# Patient Record
Sex: Female | Born: 1950 | Race: White | Hispanic: No | Smoking: Former smoker
Health system: Southern US, Community
[De-identification: ages and names within clinical notes are randomized; demographics above are authoritative.]

## PROBLEM LIST (undated history)

## (undated) DIAGNOSIS — M1990 Unspecified osteoarthritis, unspecified site: Secondary | ICD-10-CM

## (undated) DIAGNOSIS — J95811 Postprocedural pneumothorax: Secondary | ICD-10-CM

## (undated) DIAGNOSIS — E079 Disorder of thyroid, unspecified: Secondary | ICD-10-CM

## (undated) DIAGNOSIS — J449 Chronic obstructive pulmonary disease, unspecified: Secondary | ICD-10-CM

## (undated) DIAGNOSIS — I499 Cardiac arrhythmia, unspecified: Secondary | ICD-10-CM

## (undated) DIAGNOSIS — I493 Ventricular premature depolarization: Secondary | ICD-10-CM

## (undated) DIAGNOSIS — Z8489 Family history of other specified conditions: Secondary | ICD-10-CM

## (undated) DIAGNOSIS — M069 Rheumatoid arthritis, unspecified: Secondary | ICD-10-CM

## (undated) DIAGNOSIS — T4145XA Adverse effect of unspecified anesthetic, initial encounter: Secondary | ICD-10-CM

## (undated) DIAGNOSIS — E7800 Pure hypercholesterolemia, unspecified: Secondary | ICD-10-CM

## (undated) DIAGNOSIS — R112 Nausea with vomiting, unspecified: Secondary | ICD-10-CM

## (undated) DIAGNOSIS — Z9889 Other specified postprocedural states: Secondary | ICD-10-CM

## (undated) DIAGNOSIS — T8859XA Other complications of anesthesia, initial encounter: Secondary | ICD-10-CM

## (undated) DIAGNOSIS — M797 Fibromyalgia: Secondary | ICD-10-CM

## (undated) DIAGNOSIS — H269 Unspecified cataract: Secondary | ICD-10-CM

## (undated) DIAGNOSIS — E039 Hypothyroidism, unspecified: Secondary | ICD-10-CM

## (undated) DIAGNOSIS — C801 Malignant (primary) neoplasm, unspecified: Secondary | ICD-10-CM

## (undated) DIAGNOSIS — H35039 Hypertensive retinopathy, unspecified eye: Secondary | ICD-10-CM

## (undated) HISTORY — PX: NASAL SINUS SURGERY: SHX719

## (undated) HISTORY — PX: OTHER SURGICAL HISTORY: SHX169

## (undated) HISTORY — PX: TUBAL LIGATION: SHX77

## (undated) HISTORY — PX: BREAST SURGERY: SHX581

## (undated) HISTORY — PX: EYE SURGERY: SHX253

## (undated) HISTORY — DX: Hypertensive retinopathy, unspecified eye: H35.039

## (undated) HISTORY — DX: Unspecified cataract: H26.9

---

## 1998-01-15 ENCOUNTER — Other Ambulatory Visit: Admission: RE | Admit: 1998-01-15 | Discharge: 1998-01-15 | Payer: Self-pay | Admitting: Gynecology

## 1998-03-16 ENCOUNTER — Ambulatory Visit (HOSPITAL_COMMUNITY): Admission: RE | Admit: 1998-03-16 | Discharge: 1998-03-16 | Payer: Self-pay | Admitting: Gynecology

## 1998-06-20 HISTORY — PX: ABDOMINAL HYSTERECTOMY: SHX81

## 1999-01-21 ENCOUNTER — Other Ambulatory Visit: Admission: RE | Admit: 1999-01-21 | Discharge: 1999-01-21 | Payer: Self-pay | Admitting: Gynecology

## 2000-03-10 ENCOUNTER — Other Ambulatory Visit: Admission: RE | Admit: 2000-03-10 | Discharge: 2000-03-10 | Payer: Self-pay | Admitting: Gynecology

## 2000-03-14 ENCOUNTER — Encounter: Admission: RE | Admit: 2000-03-14 | Discharge: 2000-03-14 | Payer: Self-pay | Admitting: Gynecology

## 2002-04-30 ENCOUNTER — Other Ambulatory Visit: Admission: RE | Admit: 2002-04-30 | Discharge: 2002-04-30 | Payer: Self-pay | Admitting: Gynecology

## 2002-05-15 ENCOUNTER — Ambulatory Visit (HOSPITAL_COMMUNITY): Admission: RE | Admit: 2002-05-15 | Discharge: 2002-05-15 | Payer: Self-pay | Admitting: Gynecology

## 2002-05-20 ENCOUNTER — Inpatient Hospital Stay (HOSPITAL_COMMUNITY): Admission: RE | Admit: 2002-05-20 | Discharge: 2002-05-21 | Payer: Self-pay | Admitting: Gynecology

## 2002-06-21 ENCOUNTER — Ambulatory Visit (HOSPITAL_COMMUNITY): Admission: RE | Admit: 2002-06-21 | Discharge: 2002-06-21 | Payer: Self-pay | Admitting: Gynecology

## 2002-07-05 ENCOUNTER — Inpatient Hospital Stay (HOSPITAL_COMMUNITY): Admission: RE | Admit: 2002-07-05 | Discharge: 2002-07-07 | Payer: Self-pay | Admitting: Gynecology

## 2002-12-04 ENCOUNTER — Encounter: Payer: Self-pay | Admitting: Internal Medicine

## 2002-12-04 ENCOUNTER — Ambulatory Visit (HOSPITAL_COMMUNITY): Admission: RE | Admit: 2002-12-04 | Discharge: 2002-12-04 | Payer: Self-pay | Admitting: Internal Medicine

## 2003-02-21 ENCOUNTER — Encounter: Payer: Self-pay | Admitting: Otolaryngology

## 2003-02-21 ENCOUNTER — Ambulatory Visit (HOSPITAL_COMMUNITY): Admission: RE | Admit: 2003-02-21 | Discharge: 2003-02-21 | Payer: Self-pay | Admitting: Otolaryngology

## 2003-08-06 ENCOUNTER — Ambulatory Visit (HOSPITAL_COMMUNITY): Admission: RE | Admit: 2003-08-06 | Discharge: 2003-08-06 | Payer: Self-pay | Admitting: Family Medicine

## 2004-02-20 ENCOUNTER — Ambulatory Visit (HOSPITAL_COMMUNITY): Admission: RE | Admit: 2004-02-20 | Discharge: 2004-02-20 | Payer: Self-pay | Admitting: Internal Medicine

## 2004-02-25 ENCOUNTER — Ambulatory Visit (HOSPITAL_COMMUNITY): Admission: RE | Admit: 2004-02-25 | Discharge: 2004-02-25 | Payer: Self-pay | Admitting: Internal Medicine

## 2004-11-11 ENCOUNTER — Ambulatory Visit (HOSPITAL_COMMUNITY): Admission: RE | Admit: 2004-11-11 | Discharge: 2004-11-11 | Payer: Self-pay | Admitting: Family Medicine

## 2004-12-13 ENCOUNTER — Ambulatory Visit: Payer: Self-pay | Admitting: Internal Medicine

## 2005-05-27 ENCOUNTER — Ambulatory Visit (HOSPITAL_COMMUNITY): Admission: RE | Admit: 2005-05-27 | Discharge: 2005-05-27 | Payer: Self-pay | Admitting: Family Medicine

## 2005-09-02 HISTORY — PX: CARDIOVASCULAR STRESS TEST: SHX262

## 2005-10-20 ENCOUNTER — Ambulatory Visit (HOSPITAL_COMMUNITY): Admission: RE | Admit: 2005-10-20 | Discharge: 2005-10-20 | Payer: Self-pay | Admitting: Family Medicine

## 2006-02-12 ENCOUNTER — Encounter: Admission: RE | Admit: 2006-02-12 | Discharge: 2006-02-12 | Payer: Self-pay | Admitting: Rheumatology

## 2006-10-23 ENCOUNTER — Ambulatory Visit (HOSPITAL_COMMUNITY): Admission: RE | Admit: 2006-10-23 | Discharge: 2006-10-23 | Payer: Self-pay | Admitting: Family Medicine

## 2007-06-11 ENCOUNTER — Ambulatory Visit: Payer: Self-pay | Admitting: Internal Medicine

## 2007-06-28 ENCOUNTER — Telehealth: Payer: Self-pay | Admitting: Internal Medicine

## 2007-09-24 ENCOUNTER — Ambulatory Visit: Payer: Self-pay | Admitting: Internal Medicine

## 2007-09-24 DIAGNOSIS — J449 Chronic obstructive pulmonary disease, unspecified: Secondary | ICD-10-CM | POA: Insufficient documentation

## 2007-09-24 DIAGNOSIS — J4489 Other specified chronic obstructive pulmonary disease: Secondary | ICD-10-CM | POA: Insufficient documentation

## 2007-09-24 DIAGNOSIS — J984 Other disorders of lung: Secondary | ICD-10-CM | POA: Insufficient documentation

## 2007-10-30 ENCOUNTER — Ambulatory Visit (HOSPITAL_COMMUNITY): Admission: RE | Admit: 2007-10-30 | Discharge: 2007-10-30 | Payer: Self-pay | Admitting: Family Medicine

## 2008-11-19 ENCOUNTER — Ambulatory Visit (HOSPITAL_COMMUNITY): Admission: RE | Admit: 2008-11-19 | Discharge: 2008-11-19 | Payer: Self-pay | Admitting: Family Medicine

## 2009-12-08 ENCOUNTER — Ambulatory Visit (HOSPITAL_COMMUNITY): Admission: RE | Admit: 2009-12-08 | Discharge: 2009-12-08 | Payer: Self-pay | Admitting: Family Medicine

## 2010-07-11 ENCOUNTER — Encounter: Payer: Self-pay | Admitting: Family Medicine

## 2010-07-11 ENCOUNTER — Encounter: Payer: Self-pay | Admitting: Internal Medicine

## 2010-12-27 ENCOUNTER — Other Ambulatory Visit (HOSPITAL_COMMUNITY): Payer: Self-pay | Admitting: Family Medicine

## 2010-12-27 DIAGNOSIS — Z139 Encounter for screening, unspecified: Secondary | ICD-10-CM

## 2010-12-28 ENCOUNTER — Ambulatory Visit (HOSPITAL_COMMUNITY)
Admission: RE | Admit: 2010-12-28 | Discharge: 2010-12-28 | Disposition: A | Discharge status: Home / Self Care | Payer: BC Managed Care – PPO | Source: Ambulatory Visit | Attending: Family Medicine | Admitting: Family Medicine

## 2010-12-28 ENCOUNTER — Ambulatory Visit (HOSPITAL_COMMUNITY): Admission: RE | Admit: 2010-12-28 | Payer: BC Managed Care – PPO | Source: Ambulatory Visit

## 2010-12-28 DIAGNOSIS — Z1231 Encounter for screening mammogram for malignant neoplasm of breast: Secondary | ICD-10-CM | POA: Insufficient documentation

## 2010-12-28 DIAGNOSIS — Z139 Encounter for screening, unspecified: Secondary | ICD-10-CM

## 2010-12-31 ENCOUNTER — Other Ambulatory Visit: Payer: Self-pay | Admitting: Family Medicine

## 2010-12-31 DIAGNOSIS — R928 Other abnormal and inconclusive findings on diagnostic imaging of breast: Secondary | ICD-10-CM

## 2011-01-03 ENCOUNTER — Other Ambulatory Visit: Payer: Self-pay | Admitting: Family Medicine

## 2011-01-03 DIAGNOSIS — R928 Other abnormal and inconclusive findings on diagnostic imaging of breast: Secondary | ICD-10-CM

## 2011-01-05 ENCOUNTER — Ambulatory Visit
Admission: RE | Admit: 2011-01-05 | Discharge: 2011-01-05 | Disposition: A | Discharge status: Home / Self Care | Payer: BC Managed Care – PPO | Source: Ambulatory Visit | Attending: Family Medicine | Admitting: Family Medicine

## 2011-01-05 DIAGNOSIS — R928 Other abnormal and inconclusive findings on diagnostic imaging of breast: Secondary | ICD-10-CM

## 2011-06-28 NOTE — Progress Notes (Unsigned)
Patient ID: EVVA DIN, female   DOB: 10/22/1950, 61 y.o.   MRN: [id number]

## 2011-07-06 ENCOUNTER — Other Ambulatory Visit (HOSPITAL_COMMUNITY): Payer: Self-pay | Admitting: Internal Medicine

## 2011-07-06 DIAGNOSIS — R51 Headache: Secondary | ICD-10-CM

## 2011-07-06 DIAGNOSIS — R42 Dizziness and giddiness: Secondary | ICD-10-CM

## 2011-07-06 DIAGNOSIS — H539 Unspecified visual disturbance: Secondary | ICD-10-CM

## 2011-07-06 DIAGNOSIS — I69998 Other sequelae following unspecified cerebrovascular disease: Secondary | ICD-10-CM

## 2011-07-08 ENCOUNTER — Ambulatory Visit (HOSPITAL_COMMUNITY)
Admission: RE | Admit: 2011-07-08 | Discharge: 2011-07-08 | Disposition: A | Discharge status: Home / Self Care | Payer: BC Managed Care – PPO | Source: Ambulatory Visit | Attending: Internal Medicine | Admitting: Internal Medicine

## 2011-07-08 DIAGNOSIS — H539 Unspecified visual disturbance: Secondary | ICD-10-CM

## 2011-07-08 DIAGNOSIS — I69998 Other sequelae following unspecified cerebrovascular disease: Secondary | ICD-10-CM

## 2011-07-08 DIAGNOSIS — R51 Headache: Secondary | ICD-10-CM | POA: Insufficient documentation

## 2011-07-08 DIAGNOSIS — R269 Unspecified abnormalities of gait and mobility: Secondary | ICD-10-CM | POA: Insufficient documentation

## 2011-07-08 DIAGNOSIS — R42 Dizziness and giddiness: Secondary | ICD-10-CM

## 2011-10-12 ENCOUNTER — Encounter (HOSPITAL_COMMUNITY): Payer: Self-pay | Admitting: Anesthesiology

## 2011-10-12 ENCOUNTER — Inpatient Hospital Stay: Admit: 2011-10-12 | Payer: Self-pay | Admitting: Neurosurgery

## 2011-10-12 SURGERY — LUMBAR LAMINECTOMY FOR TUMOR
Anesthesia: General

## 2011-12-02 ENCOUNTER — Other Ambulatory Visit (HOSPITAL_COMMUNITY): Payer: Self-pay | Admitting: Family Medicine

## 2011-12-02 DIAGNOSIS — Z139 Encounter for screening, unspecified: Secondary | ICD-10-CM

## 2011-12-26 HISTORY — PX: US ECHOCARDIOGRAPHY: HXRAD669

## 2012-01-10 ENCOUNTER — Ambulatory Visit (HOSPITAL_COMMUNITY)
Admission: RE | Admit: 2012-01-10 | Discharge: 2012-01-10 | Disposition: A | Discharge status: Home / Self Care | Payer: BC Managed Care – PPO | Source: Ambulatory Visit | Attending: Family Medicine | Admitting: Family Medicine

## 2012-01-10 DIAGNOSIS — Z1231 Encounter for screening mammogram for malignant neoplasm of breast: Secondary | ICD-10-CM | POA: Insufficient documentation

## 2012-01-10 DIAGNOSIS — Z139 Encounter for screening, unspecified: Secondary | ICD-10-CM

## 2012-02-19 ENCOUNTER — Emergency Department (HOSPITAL_COMMUNITY)
Admission: EM | Admit: 2012-02-19 | Discharge: 2012-02-19 | Disposition: A | Discharge status: Home / Self Care | Payer: BC Managed Care – PPO | Attending: Emergency Medicine | Admitting: Emergency Medicine

## 2012-02-19 ENCOUNTER — Encounter (HOSPITAL_COMMUNITY): Payer: Self-pay | Admitting: Emergency Medicine

## 2012-02-19 ENCOUNTER — Emergency Department (HOSPITAL_COMMUNITY): Payer: BC Managed Care – PPO

## 2012-02-19 DIAGNOSIS — IMO0001 Reserved for inherently not codable concepts without codable children: Secondary | ICD-10-CM | POA: Insufficient documentation

## 2012-02-19 DIAGNOSIS — M1990 Unspecified osteoarthritis, unspecified site: Secondary | ICD-10-CM | POA: Insufficient documentation

## 2012-02-19 DIAGNOSIS — E079 Disorder of thyroid, unspecified: Secondary | ICD-10-CM | POA: Insufficient documentation

## 2012-02-19 DIAGNOSIS — E7800 Pure hypercholesterolemia, unspecified: Secondary | ICD-10-CM | POA: Insufficient documentation

## 2012-02-19 DIAGNOSIS — M79609 Pain in unspecified limb: Secondary | ICD-10-CM | POA: Insufficient documentation

## 2012-02-19 DIAGNOSIS — Z87891 Personal history of nicotine dependence: Secondary | ICD-10-CM | POA: Insufficient documentation

## 2012-02-19 DIAGNOSIS — M79672 Pain in left foot: Secondary | ICD-10-CM

## 2012-02-19 HISTORY — DX: Disorder of thyroid, unspecified: E07.9

## 2012-02-19 HISTORY — DX: Unspecified osteoarthritis, unspecified site: M19.90

## 2012-02-19 HISTORY — DX: Postprocedural pneumothorax: J95.811

## 2012-02-19 HISTORY — DX: Cardiac arrhythmia, unspecified: I49.9

## 2012-02-19 HISTORY — DX: Pure hypercholesterolemia, unspecified: E78.00

## 2012-02-19 HISTORY — DX: Fibromyalgia: M79.7

## 2012-02-19 MED ORDER — IBUPROFEN 800 MG PO TABS
800.0000 mg | ORAL_TABLET | Freq: Once | ORAL | Status: AC
  Administered 2012-02-19: 800 mg via ORAL
  Filled 2012-02-19: qty 1

## 2012-02-19 MED ORDER — ONDANSETRON 4 MG PO TBDP
4.0000 mg | ORAL_TABLET | Freq: Once | ORAL | Status: AC
  Administered 2012-02-19: 4 mg via ORAL
  Filled 2012-02-19: qty 1

## 2012-02-19 MED ORDER — HYDROCODONE-ACETAMINOPHEN 5-325 MG PO TABS
1.0000 | ORAL_TABLET | Freq: Once | ORAL | Status: AC
  Administered 2012-02-19: 1 via ORAL
  Filled 2012-02-19: qty 1

## 2012-02-19 MED ORDER — HYDROCODONE-ACETAMINOPHEN 5-325 MG PO TABS: ORAL_TABLET | ORAL | Status: DC

## 2012-02-19 MED ORDER — HYDROCOD POLST-CHLORPHEN POLST 10-8 MG/5ML PO LQCR: 5.0000 mL | Freq: Once | ORAL | Status: DC

## 2012-02-19 MED ORDER — ONDANSETRON 4 MG PO TBDP: 4.0000 mg | ORAL_TABLET | Freq: Three times a day (TID) | ORAL | Status: AC | PRN

## 2012-02-19 NOTE — ED Provider Notes (Signed)
Medical screening examination/treatment/procedure(s) were performed by non-physician practitioner and as supervising physician I was immediately available for consultation/collaboration.   Christyne Mccain L Chakia Counts, MD 02/19/12 2239 

## 2012-02-19 NOTE — ED Notes (Signed)
Pt c/o left foot pain since 1630. Pt states pain radiates up left leg

## 2012-02-19 NOTE — ED Provider Notes (Signed)
History     CSN: [id number]  Arrival date & time 02/19/12  4132   First MD Initiated Contact with Patient 02/19/12 1940      Chief Complaint  Patient presents with  . Foot Pain    (Consider location/radiation/quality/duration/timing/severity/associated sxs/prior treatment) HPI Comments: Standing in her kitchen washing dishes and her L foot began hurting.  Denies trauma.  No h/o gout.  + h/o of osteoporosis.  Patient is a 61 y.o. female presenting with lower extremity pain. The history is provided by the patient. No language interpreter was used.  Foot Pain This is a new problem. The current episode started today. The problem occurs constantly. The problem has been unchanged. Pertinent negatives include no chills, fever, numbness or weakness. The symptoms are aggravated by walking and standing. She has tried nothing for the symptoms.    Past Medical History  Diagnosis Date  . Thyroid disease   . Irregular heart beat   . High cholesterol   . Pneumothorax after biopsy   . Osteoarthritis   . Fibromyalgia     Past Surgical History  Procedure Date  . Abdominal hysterectomy   . Breast surgery   . Tubal ligation   . Nasal sinus surgery     No family history on file.  History  Substance Use Topics  . Smoking status: Former Games developer  . Smokeless tobacco: Not on file  . Alcohol Use: No    OB History    Grav Para Term Preterm Abortions TAB SAB Ect Mult Living                  Review of Systems  Constitutional: Negative for fever and chills.  Musculoskeletal:       Foot pain   Neurological: Negative for weakness and numbness.  All other systems reviewed and are negative.    Allergies  Codeine  Home Medications   Current Outpatient Rx  Name Route Sig Dispense Refill  . LEVOTHYROXINE SODIUM 75 MCG PO TABS Oral Take 75 mcg by mouth daily.    Marland Kitchen METOPROLOL SUCCINATE ER 50 MG PO TB24 Oral Take 75 mg by mouth Daily.    Marland Kitchen HYDROCODONE-ACETAMINOPHEN 5-325 MG PO TABS   One tab po q 4-6 hrs prn pain 20 tablet 0    BP 138/84  Pulse 74  Temp 98 F (36.7 C) (Oral)  Resp 20  Ht 5\' 5"  (1.651 m)  Wt 155 lb (70.308 kg)  BMI 25.79 kg/m2  SpO2 100%  Physical Exam  Nursing note and vitals reviewed. Constitutional: She is oriented to person, place, and time. She appears well-developed and well-nourished. No distress.  HENT:  Head: Normocephalic and atraumatic.  Eyes: EOM are normal.  Neck: Normal range of motion.  Cardiovascular: Normal rate, regular rhythm and normal heart sounds.   Pulmonary/Chest: Effort normal and breath sounds normal.  Abdominal: Soft. She exhibits no distension. There is no tenderness.  Musculoskeletal: Normal range of motion.       Feet:  Neurological: She is alert and oriented to person, place, and time.  Skin: Skin is warm and dry.  Psychiatric: She has a normal mood and affect. Judgment normal.    ED Course  Procedures (including critical care time)  Labs Reviewed - No data to display Dg Foot Complete Left  02/19/2012  *RADIOLOGY REPORT*  Clinical Data: Generalized left foot pain.  No known injuries.  LEFT FOOT - COMPLETE 3+ VIEW  Comparison: None.  Findings: No evidence of acute or subacute  fracture or dislocation. Well-preserved joint spaces.  Well-preserved bone mineral density. No intrinsic osseous abnormalities.  IMPRESSION: Normal examination.   Original Report Authenticated By: Arnell Sieving, M.D.      1. Left foot pain       MDM  Ice, ibuprofen rx-hydrocodone, 20 F/u with dr. Hilda Lias prn         Evalina Field, PA 02/19/12 2140

## 2013-04-23 ENCOUNTER — Ambulatory Visit (INDEPENDENT_AMBULATORY_CARE_PROVIDER_SITE_OTHER): Payer: BC Managed Care – PPO | Admitting: Cardiovascular Disease

## 2013-04-23 ENCOUNTER — Encounter: Payer: Self-pay | Admitting: Cardiovascular Disease

## 2013-04-23 VITALS — BP 110/80 | HR 51 | Ht 66.0 in | Wt 143.0 lb

## 2013-04-23 DIAGNOSIS — E785 Hyperlipidemia, unspecified: Secondary | ICD-10-CM

## 2013-04-23 DIAGNOSIS — E039 Hypothyroidism, unspecified: Secondary | ICD-10-CM

## 2013-04-23 DIAGNOSIS — R002 Palpitations: Secondary | ICD-10-CM | POA: Insufficient documentation

## 2013-04-23 NOTE — Progress Notes (Signed)
Patient ID: Dominique Lee, female   DOB: 1951/02/17, 62 y.o.   MRN: [id number]     HPI: [name] is a 62 y.o. female who presents for one-year cardiology evaluation.  Dominique Lee has a history of palpitations with documented PVCs which have been controlled with increased beta blocker therapy to 75 mg. She also has a history of hypothyroidism on Synthroid replacement. She has a history of significant hyperlipidemia but apparently has been intolerant to numerous statins as well as WelChol in addition to Zetia. She also has seasonal allergies.  Over the past year, she has continued to be fairly stable. She denies any significant palpitations. 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 Her last echo Doppler study showed an EF of 5055% with normal chamber dimensions and muscle thickness. She denies chest pain. He denies presyncope. She denies edema.  Past Medical History  Diagnosis Date  . Thyroid disease   . Irregular heart beat   . High cholesterol   . Pneumothorax after biopsy   . Osteoarthritis   . Fibromyalgia     Past Surgical History  Procedure Laterality Date  . Abdominal hysterectomy  2000  . Breast surgery    . Tubal ligation    . Nasal sinus surgery      Allergies  Allergen Reactions  . Codeine Other (See Comments)    Makes violent    Current Outpatient Prescriptions  Medication Sig Dispense Refill  . levothyroxine (SYNTHROID, LEVOTHROID) 75 MCG tablet Take 75 mcg by mouth daily.      . metoprolol succinate (TOPROL-XL) 50 MG 24 hr tablet Take 75 mg by mouth Daily.       No current facility-administered medications for this visit.    History   Social History  . Marital Status: Divorced    Spouse Name: N/A    Number of Children: N/A  . Years of Education: N/A   Occupational History  . Not on file.   Social History Main Topics  . Smoking status: Former Smoker    Types: Cigarettes    Quit date: 11/04/2002  .  Smokeless tobacco: Not on file  . Alcohol Use: No  . Drug Use: No  . Sexual Activity: Not on file   Other Topics Concern  . Not on file   Social History Narrative  . No narrative on file   Social history is notable that she is divorced with 3 children. She works as a Surveyor, mining. No tobacco or alcohol use.  Family History  Problem Relation Age of Onset  . Hypertension Mother   . Cancer - Lung Father 107    deceased  . Heart attack Maternal Grandmother   . Cancer Maternal Grandfather   . Diabetes Paternal Grandfather   . Diabetes Brother   . Cancer - Colon Sister     ROS is negative for fevers, chills or night sweats. She denies visual symptoms. She denies PND or orthopnea. He denies wheezing chest congestion or sputum production. She denies adenopathy. She denies any significant tachycardia palpitations. She denies chest pressure. She denies nausea vomiting diarrhea. In the past and significant myalgias secondary to statins. Apparently cannot tolerate WelChol or Zetia. She denies skin rash. There are no tremors. She denies weight loss or heat or cold intolerance. There is no edema.   Other comprehensive 12 point system review is negative.  PE BP 110/80  Pulse 51  Ht 5\' 6"  (1.676 m)  Wt 143  lb (64.864 kg)  BMI 23.09 kg/m2  General: Alert, oriented, no distress.  Skin: normal turgor, no rashes HEENT: Normocephalic, atraumatic. Pupils round and reactive; sclera anicteric;no lid lag.  Nose without nasal septal hypertrophy Mouth/Parynx benign; Mallinpatti scale 2 Neck: No JVD, no carotid briuts Lungs: clear to ausculatation and percussion; no wheezing or rales Heart: RRR, s1 s2 normal 1/6 systolic murmur. Abdomen: soft, nontender; no hepatosplenomehaly, BS+; abdominal aorta nontender and not dilated by palpation. Pulses 2+ Extremities: no clubbing cyanosis or edema, Homan's sign negative  Neurologic: grossly nonfocal Psychologic: normal affect and mood.  ECG: Sinus  bradycardia 51 beats per minute without ectopy. Normal intervals.  LABS:  BMET No results found for this basename: na, k, cl, co2, glucose, bun, creatinine, calcium, gfrnonaa, gfraa     Hepatic Function Panel  No results found for this basename: prot, albumin, ast, alt, alkphos, bilitot, bilidir, ibili     CBC No results found for this basename: wbc, rbc, hgb, hct, plt, mcv, mch, mchc, rdw, neutrabs, lymphsabs, monoabs, eosabs, basosabs     BNP No results found for this basename: probnp    Lipid Panel  No results found for this basename: chol, trig, hdl, cholhdl, vldl, ldlcalc     RADIOLOGY: No results found.    ASSESSMENT AND PLAN:  Dominique Lee continues to feel well. She's not having any palpitations has been currently taking her Toprol 75 mg dose at bedtime. She is in need for thyroid function as well as laboratory. She's not had any lipid studies checked in over 2 years. When Dr. Fara Boros does check her laboratory it may be possible if her cholesterol is still even further increased to consider perhaps a trial of little low which she has not tried in the past. Presently, we'll try to slightly reduce her beta blocker therapy so that she will take 50 mg on even days and 75 mg one odd days. If after one month of this approach if she is not noticing any difference between the 2 days then she can try to reduce her dose to 50 mg daily. I will see her in one year for followup evaluation or sooner if problems arise.    Lennette Bihari, MD, [facility]  04/23/2013 4:26 PM

## 2013-04-23 NOTE — Patient Instructions (Signed)
Your physician has recommended you make the following change in your medication: change  Metoprolol to 75 mg on odd days and 50 mg on even days.  Your physician recommends that you schedule a follow-up appointment in: 1 YEAR.

## 2013-09-04 ENCOUNTER — Other Ambulatory Visit: Payer: Self-pay | Admitting: Cardiovascular Disease

## 2013-09-04 NOTE — Telephone Encounter (Signed)
Rx was sent to pharmacy electronically. 

## 2013-10-07 ENCOUNTER — Other Ambulatory Visit (HOSPITAL_COMMUNITY): Payer: Self-pay | Admitting: Family Medicine

## 2013-10-07 DIAGNOSIS — Z139 Encounter for screening, unspecified: Secondary | ICD-10-CM

## 2013-10-10 ENCOUNTER — Ambulatory Visit (HOSPITAL_COMMUNITY)
Admission: RE | Admit: 2013-10-10 | Discharge: 2013-10-10 | Disposition: A | Discharge status: Home / Self Care | Payer: BC Managed Care – PPO | Source: Ambulatory Visit | Attending: Family Medicine | Admitting: Family Medicine

## 2013-10-10 DIAGNOSIS — Z139 Encounter for screening, unspecified: Secondary | ICD-10-CM

## 2013-11-06 ENCOUNTER — Telehealth: Payer: Self-pay | Admitting: Cardiovascular Disease

## 2013-11-06 NOTE — Telephone Encounter (Signed)
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  Please call

## 2013-11-06 NOTE — Telephone Encounter (Signed)
Pt. Asking what is the status of her form she needed filled out to drive

## 2013-11-07 ENCOUNTER — Telehealth: Payer: Self-pay | Admitting: *Deleted

## 2013-11-07 NOTE — Telephone Encounter (Signed)
Left message driving form has been completed and left at our front desk for her to pick up.

## 2014-05-07 ENCOUNTER — Encounter: Payer: Self-pay | Admitting: *Deleted

## 2014-05-12 ENCOUNTER — Encounter: Payer: Self-pay | Admitting: Cardiovascular Disease

## 2014-05-12 ENCOUNTER — Ambulatory Visit (INDEPENDENT_AMBULATORY_CARE_PROVIDER_SITE_OTHER): Payer: BC Managed Care – PPO | Admitting: Cardiovascular Disease

## 2014-05-12 VITALS — BP 126/70 | HR 52 | Ht 65.5 in | Wt 155.4 lb

## 2014-05-12 DIAGNOSIS — E785 Hyperlipidemia, unspecified: Secondary | ICD-10-CM

## 2014-05-12 DIAGNOSIS — E039 Hypothyroidism, unspecified: Secondary | ICD-10-CM

## 2014-05-12 DIAGNOSIS — R002 Palpitations: Secondary | ICD-10-CM

## 2014-05-12 NOTE — Patient Instructions (Signed)
Your physician wants you to follow-up in: 1 year or sooner if needed with Dr. Kelly. You will receive a reminder letter in the mail two months in advance. If you don't receive a letter, please call our office to schedule the follow-up appointment. 

## 2014-05-12 NOTE — Progress Notes (Signed)
Patient ID: Dominique Lee, female   DOB: 07-03-1950, 63 y.o.   MRN: [id number]     HPI: Dominique Lee is a 63 y.o. female who presents for one-year cardiology evaluation.  She tells me that she will be moving to the [facility] [place] area.  Next month, but she would still like to see me on at least a yearly visit basis.  Dominique Lee has a history of palpitations with documented PVCs which have been controlled with increased beta blocker therapy to 75 mg. She also has a history of hypothyroidism on Synthroid replacement. She has a history of significant hyperlipidemia but apparently has been intolerant to numerous statins as well as WelChol in addition to Zetia. She also has seasonal allergies.  Over the past year, she has continued to be fairly stable. She denies any significant palpitations.  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  She denies recent palpitations.  She denies chest pain.  She tells me Dr. Herby Abraham had checked laboratory over the summer months.  She presents for one-year evaluation.  Past Medical History  Diagnosis Date  . Thyroid disease   . Irregular heart beat   . High cholesterol   . Pneumothorax after biopsy   . Osteoarthritis   . Fibromyalgia     Past Surgical History  Procedure Laterality Date  . Abdominal hysterectomy  2000  . Breast surgery    . Tubal ligation    . Nasal sinus surgery    . US echocardiography  12/26/2011    mild abnormalities  . Cardiovascular stress test  09/02/2005    Cardiolite Myocardial perfusion study; NegativeBruce protocol exercise stress test    Allergies  Allergen Reactions  . Codeine Other (See Comments)    Makes violent  . Zocor [Simvastatin] Other (See Comments)    Current Outpatient Prescriptions  Medication Sig Dispense Refill  . levothyroxine (SYNTHROID, LEVOTHROID) 75 MCG tablet Take 75 mcg by mouth daily.    . metoprolol succinate  (TOPROL-XL) 50 MG 24 hr tablet Take 1-1.5 tablet (50 or 75mg  total) by mouth daily as directed. 45 tablet 8  . naproxen sodium (ANAPROX) 220 MG tablet Take 220 mg by mouth daily.     No current facility-administered medications for this visit.    History   Social History  . Marital Status: Divorced    Spouse Name: N/A    Number of Children: N/A  . Years of Education: N/A   Occupational History  . Not on file.   Social History Main Topics  . Smoking status: Former Smoker    Types: Cigarettes    Quit date: 11/04/2002  . Smokeless tobacco: Not on file  . Alcohol Use: No  . Drug Use: No  . Sexual Activity: Not on file   Other Topics Concern  . Not on file   Social History Narrative   Social history is notable that she is divorced with 3 children. She works as a Teacher, early years/pre. No tobacco or alcohol use.  Family History  Problem Relation Age of Onset  . Hypertension Mother   . Cancer - Lung Father 81    deceased  . Heart attack Maternal Grandmother   . Cancer Maternal Grandfather   . Diabetes Paternal Grandfather   . Diabetes Brother   . Cancer - Colon Sister    ROS General: Negative; No fevers, chills, or night sweats;  HEENT: Negative; No changes in vision or hearing, sinus congestion, difficulty swallowing Pulmonary: Negative; No cough, wheezing, shortness of breath, hemoptysis Cardiovascular: Negative; No chest pain, presyncope, syncope, palpitations GI: Negative; No nausea, vomiting, diarrhea, or abdominal pain GU: Negative; No dysuria, hematuria, or difficulty voiding Musculoskeletal: Mild osteoarthritis; no myalgias, or weakness Hematologic/Oncology: Negative; no easy bruising, bleeding Endocrine: Negative; no heat/cold intolerance; no diabetes Neuro: Negative; no changes in balance, headaches Skin: Negative; No rashes or skin lesions Psychiatric: Negative; No behavioral problems, depression Sleep: Negative; No snoring, daytime sleepiness,  hypersomnolence, bruxism, restless legs, hypnogognic hallucinations, no cataplexy Other comprehensive 14 point system review is negative.   PE BP 126/70 mmHg  Pulse 52  Ht 5' 5.5" (1.664 m)  Wt 155 lb 6.4 oz (70.489 kg)  BMI 25.46 kg/m2  General: Alert, oriented, no distress.  Skin: normal turgor, no rashes HEENT: Normocephalic, atraumatic. Pupils round and reactive; sclera anicteric;no lid lag.  Nose without nasal septal hypertrophy Mouth/Parynx benign; Mallinpatti scale 2 Neck: No JVD, no carotid bruits with normal carotid upstroke Lungs: clear to ausculatation and percussion; no wheezing or rales Chest wall: Nontender to palpation Heart: RRR, s1 s2 normal 1/6 systolic murmur; no diastolic murmur, rubs, thrills or heaves Abdomen: soft, nontender; no hepatosplenomehaly, BS+; abdominal aorta nontender and not dilated by palpation. Back: No CVA tenderness Pulses 2+ Extremities: no clubbing cyanosis or edema, Homan's sign negative  Neurologic: grossly nonfocal; radial nerves.  Normal Psychologic: normal affect and mood.  ECG (independently read by me): Sinus bradycardia 52 bpm.  No ectopy.  Normal intervals.  Prior November 2014 ECG: Sinus bradycardia 51 beats per minute without ectopy. Normal intervals.  LABS:  BMET No results found for: NA   Hepatic Function Panel  No results found for: PROT   CBC No results found for: WBC   BNP No results found for: PROBNP  Lipid Panel  No results found for: CHOL   RADIOLOGY: No results found.    ASSESSMENT AND PLAN:  Dominique Lee is a 63 year old female who continues to feel well. She's not having any palpitations has been currently taking her Toprol 75 mg dose at bedtime.  Her blood pressure today is controlled.  Her resting pulse is bradycardic but she is asymptomatic.  She is on levothyroxine 75 g for hypothyroidism.  Laboratory was checked over the past several months by Dr. Orson Ape prior to his retirement.  She  does have mild arthritic complaints which he takes Naprosyn.  She's not having any chest pain.  I have recommended that she continue her current medical regimen.  We will try to obtain the results of the blood work.  I congratulated her on her upcoming [place].  She would still like to see me on a yearly basis and I will schedule her for follow-up appointment at that time.  Troy Sine, MD, [facility]  05/12/2014 12:48 PM

## 2014-07-30 ENCOUNTER — Other Ambulatory Visit: Payer: Self-pay | Admitting: Cardiovascular Disease

## 2014-07-30 NOTE — Telephone Encounter (Signed)
Rx(s) sent to pharmacy electronically.  

## 2015-04-02 ENCOUNTER — Telehealth: Payer: Self-pay | Admitting: Cardiovascular Disease

## 2015-04-02 NOTE — Telephone Encounter (Signed)
Left message for patient to return call.

## 2015-04-02 NOTE — Telephone Encounter (Signed)
Pt will bring by paperwork for driver's license for Dr. Evette Georges signature.

## 2015-04-02 NOTE — Telephone Encounter (Signed)
Pt wants to know if she can bring over some medical review form for Dr Claiborne Billings to fill out please?

## 2015-04-16 ENCOUNTER — Telehealth: Payer: Self-pay | Admitting: Cardiovascular Disease

## 2015-04-16 NOTE — Telephone Encounter (Signed)
She wants to know if her Medical Review Report is ready? She dropped them off over here on 19-14-16.

## 2015-04-16 NOTE — Telephone Encounter (Signed)
Returned call to patient no answer.Left message on personal voice mail Mariann Laster Dr.Kelly's CMA out of office today will send message to her.

## 2015-04-17 NOTE — Telephone Encounter (Signed)
Pt says that her phone rang once and then someone hung up. So she thought that it might have been [place] calling her back.  Thanks

## 2015-04-17 NOTE — Telephone Encounter (Signed)
Called patient and informed her the form has been completed just awaitnig a signature.  She should be able to pick the form up on Tuesday November 1st. Dr. Claiborne Billings and Lurena Joiner both will be here on that day. One or the other will sign it then. Patient voiced verbal understanding.

## 2015-08-07 ENCOUNTER — Telehealth: Payer: Self-pay | Admitting: Cardiovascular Disease

## 2015-08-07 NOTE — Telephone Encounter (Signed)
Please call,patient wants to know what cholesterol medicine she had taken previously. She needs to know what she tried and did not work.

## 2015-08-07 NOTE — Telephone Encounter (Signed)
SPOKE TO PATIENT  SHE STATES HER PRESENT DOCTOR,WANTED HER RETRY [place]  RN INFORMED PATIENT DOCUMENTATION FROM 2012 - [place] - ( NO NAMES) PATIENT AWARE

## 2017-05-18 HISTORY — PX: LAPAROSCOPIC PARTIAL COLECTOMY: SHX5907

## 2017-12-17 ENCOUNTER — Encounter (HOSPITAL_COMMUNITY): Payer: Self-pay | Admitting: Internal Medicine

## 2017-12-17 ENCOUNTER — Inpatient Hospital Stay (HOSPITAL_COMMUNITY): Payer: Medicare Other

## 2017-12-17 ENCOUNTER — Inpatient Hospital Stay (HOSPITAL_COMMUNITY)
Admission: AD | Admit: 2017-12-17 | Discharge: 2017-12-20 | DRG: 481 | Disposition: A | Discharge status: Home / Self Care | Payer: Medicare Other | Source: Other Acute Inpatient Hospital | Attending: Family Medicine | Admitting: Family Medicine

## 2017-12-17 DIAGNOSIS — M069 Rheumatoid arthritis, unspecified: Secondary | ICD-10-CM | POA: Diagnosis present

## 2017-12-17 DIAGNOSIS — T148XXA Other injury of unspecified body region, initial encounter: Secondary | ICD-10-CM

## 2017-12-17 DIAGNOSIS — S72011A Unspecified intracapsular fracture of right femur, initial encounter for closed fracture: Secondary | ICD-10-CM | POA: Diagnosis present

## 2017-12-17 DIAGNOSIS — Z888 Allergy status to other drugs, medicaments and biological substances status: Secondary | ICD-10-CM

## 2017-12-17 DIAGNOSIS — M1990 Unspecified osteoarthritis, unspecified site: Secondary | ICD-10-CM | POA: Diagnosis present

## 2017-12-17 DIAGNOSIS — I493 Ventricular premature depolarization: Secondary | ICD-10-CM

## 2017-12-17 DIAGNOSIS — Z9851 Tubal ligation status: Secondary | ICD-10-CM

## 2017-12-17 DIAGNOSIS — S0083XA Contusion of other part of head, initial encounter: Secondary | ICD-10-CM | POA: Diagnosis present

## 2017-12-17 DIAGNOSIS — E785 Hyperlipidemia, unspecified: Secondary | ICD-10-CM | POA: Diagnosis present

## 2017-12-17 DIAGNOSIS — J449 Chronic obstructive pulmonary disease, unspecified: Secondary | ICD-10-CM | POA: Diagnosis present

## 2017-12-17 DIAGNOSIS — Z885 Allergy status to narcotic agent status: Secondary | ICD-10-CM

## 2017-12-17 DIAGNOSIS — Z90710 Acquired absence of both cervix and uterus: Secondary | ICD-10-CM | POA: Diagnosis not present

## 2017-12-17 DIAGNOSIS — Z79899 Other long term (current) drug therapy: Secondary | ICD-10-CM

## 2017-12-17 DIAGNOSIS — Z419 Encounter for procedure for purposes other than remedying health state, unspecified: Secondary | ICD-10-CM

## 2017-12-17 DIAGNOSIS — Z800 Family history of malignant neoplasm of digestive organs: Secondary | ICD-10-CM

## 2017-12-17 DIAGNOSIS — Z79890 Hormone replacement therapy (postmenopausal): Secondary | ICD-10-CM | POA: Diagnosis not present

## 2017-12-17 DIAGNOSIS — D62 Acute posthemorrhagic anemia: Secondary | ICD-10-CM | POA: Diagnosis not present

## 2017-12-17 DIAGNOSIS — M797 Fibromyalgia: Secondary | ICD-10-CM | POA: Diagnosis present

## 2017-12-17 DIAGNOSIS — S72001A Fracture of unspecified part of neck of right femur, initial encounter for closed fracture: Secondary | ICD-10-CM | POA: Diagnosis not present

## 2017-12-17 DIAGNOSIS — E039 Hypothyroidism, unspecified: Secondary | ICD-10-CM | POA: Diagnosis present

## 2017-12-17 DIAGNOSIS — W19XXXA Unspecified fall, initial encounter: Secondary | ICD-10-CM | POA: Diagnosis present

## 2017-12-17 DIAGNOSIS — Y92511 Restaurant or cafe as the place of occurrence of the external cause: Secondary | ICD-10-CM | POA: Diagnosis not present

## 2017-12-17 DIAGNOSIS — Z09 Encounter for follow-up examination after completed treatment for conditions other than malignant neoplasm: Secondary | ICD-10-CM

## 2017-12-17 DIAGNOSIS — Z7951 Long term (current) use of inhaled steroids: Secondary | ICD-10-CM

## 2017-12-17 DIAGNOSIS — Z87891 Personal history of nicotine dependence: Secondary | ICD-10-CM | POA: Diagnosis not present

## 2017-12-17 DIAGNOSIS — S72009A Fracture of unspecified part of neck of unspecified femur, initial encounter for closed fracture: Secondary | ICD-10-CM | POA: Diagnosis present

## 2017-12-17 HISTORY — DX: Chronic obstructive pulmonary disease, unspecified: J44.9

## 2017-12-17 HISTORY — DX: Ventricular premature depolarization: I49.3

## 2017-12-17 HISTORY — DX: Hypothyroidism, unspecified: E03.9

## 2017-12-17 HISTORY — DX: Rheumatoid arthritis, unspecified: M06.9

## 2017-12-17 LAB — APTT: APTT: 31 s (ref 24–36)

## 2017-12-17 LAB — PROTIME-INR
INR: 1.07
PROTHROMBIN TIME: 13.8 s (ref 11.4–15.2)

## 2017-12-17 MED ORDER — ONDANSETRON HCL 4 MG/2ML IJ SOLN: 4.0000 mg | Freq: Three times a day (TID) | INTRAMUSCULAR | Status: DC | PRN

## 2017-12-17 MED ORDER — POLYETHYLENE GLYCOL 3350 17 G PO PACK: 17.0000 g | PACK | Freq: Every day | ORAL | Status: DC | PRN

## 2017-12-17 MED ORDER — ACETAMINOPHEN 325 MG PO TABS: 650.0000 mg | ORAL_TABLET | Freq: Four times a day (QID) | ORAL | Status: DC | PRN

## 2017-12-17 MED ORDER — KETOROLAC TROMETHAMINE 15 MG/ML IJ SOLN
15.0000 mg | Freq: Four times a day (QID) | INTRAMUSCULAR | Status: DC | PRN
  Administered 2017-12-17 – 2017-12-20 (×7): 15 mg via INTRAVENOUS
  Filled 2017-12-17 (×8): qty 1

## 2017-12-17 MED ORDER — FOLIC ACID 5 MG/ML IJ SOLN
1.0000 mg | Freq: Every day | INTRAMUSCULAR | Status: DC
  Administered 2017-12-18 – 2017-12-19 (×2): 1 mg via INTRAVENOUS
  Filled 2017-12-17 (×2): qty 0.2

## 2017-12-17 MED ORDER — METOPROLOL SUCCINATE ER 50 MG PO TB24
50.0000 mg | ORAL_TABLET | Freq: Every day | ORAL | Status: DC
  Administered 2017-12-18 – 2017-12-20 (×3): 50 mg via ORAL
  Filled 2017-12-17 (×3): qty 1

## 2017-12-17 MED ORDER — LEVOTHYROXINE SODIUM 75 MCG PO TABS
75.0000 ug | ORAL_TABLET | Freq: Every day | ORAL | Status: DC
  Administered 2017-12-18 – 2017-12-20 (×3): 75 ug via ORAL
  Filled 2017-12-17 (×3): qty 1

## 2017-12-17 MED ORDER — CYCLOBENZAPRINE HCL 5 MG PO TABS
7.5000 mg | ORAL_TABLET | Freq: Three times a day (TID) | ORAL | Status: DC | PRN
  Filled 2017-12-17: qty 1.5

## 2017-12-17 MED ORDER — ZOLPIDEM TARTRATE 5 MG PO TABS: 5.0000 mg | ORAL_TABLET | Freq: Every evening | ORAL | Status: DC | PRN

## 2017-12-17 MED ORDER — METHOTREXATE 2.5 MG PO TABS: 2.5000 mg | ORAL_TABLET | ORAL | Status: DC

## 2017-12-17 MED ORDER — SODIUM CHLORIDE 0.9 % IV SOLN
INTRAVENOUS | Status: DC
  Administered 2017-12-18: 75 mL/h via INTRAVENOUS
  Administered 2017-12-18: 23:00:00 via INTRAVENOUS

## 2017-12-17 MED ORDER — LEFLUNOMIDE 20 MG PO TABS
10.0000 mg | ORAL_TABLET | Freq: Every day | ORAL | Status: DC
  Administered 2017-12-17 – 2017-12-20 (×3): 10 mg via ORAL
  Filled 2017-12-17 (×4): qty 0.5

## 2017-12-17 MED ORDER — FLUTICASONE PROPIONATE 50 MCG/ACT NA SUSP
2.0000 | Freq: Every day | NASAL | Status: DC
  Filled 2017-12-17 (×2): qty 16

## 2017-12-17 NOTE — Progress Notes (Signed)
Dr. Griffin Basil returned RN's call. Advised RN to make patient NPO at midnight for possible surgery tomorrow pending review of imaging. Patient arrived with imaging on a CD. RN will notify correct department for assistance with uploading images.

## 2017-12-17 NOTE — Progress Notes (Signed)
Request from outside hospital for orthopedic care in setting of femoral neck fracture. Need imaging uploaded from outside facility and preop medical clearance performed. Reportedly displaced femoral neck fracture noted per OSH ER physician. Pending OR plan based on clearance and imaging.

## 2017-12-17 NOTE — Progress Notes (Signed)
RN called Radiology for assistance with imaging upload. The representative was unable to assist with uploading and recommended directly inserting the CD into the computer for viewing. Representative also suggested that Medical Records may be able to assist with uploading the images however, they are not available until tomorrow. CD currently still in patient's chart.

## 2017-12-17 NOTE — H&P (Signed)
History and Physical    Dominique Lee VOP:[id number] DOB: 04-Jun-1951 DOA: 12/17/2017  Referring MD/NP/PA:   PCP: Elsie Lincoln, MD   Patient coming from:  The patient is coming from home.  At baseline, pt is independent for most of ADL.   Chief Complaint: fall and right hip pain  HPI: Dominique Lee is a 67 y.o. female with medical history significant of rheumatoid arthritis, hyperlipidemia, COPD, fibromyalgia, [name], hypothyroidism, COPD, who presents with fall, right hip pain.  Pt is transferred her from [facility] due to right hip fracture.   Pt states that she fell accidentally in [place]. She hurt her head and developed hematoma in left frontal area. She also developed severe pain in the right hip, which is constant, sharp, nonradiating, 8 out of 10 severity, aggravated by movement.  She also reports mild numbness in the right foot.  No leg weakness.  No loss control for bowel movement or bladder.  Patient does not have chest pain, shortness of breath, cough, fever or chills.  No nausea, vomiting, diarrhea, abdominal pain, symptoms of UTI.  Of note, multiple myeloma with bone metastasized is on her medical problem list, but the patient denies this history.  I remove this diagnosis from medical problem list.  Patient states that she is taking metoprolol for [name].  Pt was seen initially in [facility] and found to have " acute minimally impacted subcapital right femoral neck fracture and small cystocele" by CT scan. They consulted orthopedic surgeon, Dr. Griffin Basil who agreed to consult on this case.  Patient is transferred here.  P was found to have WBC 12.4, electrolytes renal function okay, temperature normal, no tachycardia, no tachypnea, oxygen saturation 97% on room air, negative chest x-ray.  Patient is admitted to telemetry bed as inpatient.  Review of Systems:   General: no fevers, chills, no body weight gain, has fatigue HEENT: no blurry vision, hearing changes or sore  throat. Has a left frontal area Respiratory: no dyspnea, coughing, wheezing CV: no chest pain, no palpitations GI: no nausea, vomiting, abdominal pain, diarrhea, constipation GU: no dysuria, burning on urination, increased urinary frequency, hematuria  Ext: no leg edema Neuro: no unilateral weakness, numbness, or tingling, no vision change or hearing loss. Had fall. Skin: no rash, no skin tear. MSK: has right hip pain Heme: No easy bruising.  Travel history: No recent long distant travel.  Allergy:  Allergies  Allergen Reactions  . Codeine Other (See Comments)    Agitation   . Morphine And Related Other (See Comments)    agitation   . Statins Other (See Comments)    Joint  pain    Past Medical History:  Diagnosis Date  . COPD (chronic obstructive pulmonary disease) ([place])   . Fibromyalgia   . High cholesterol   . Hypothyroidism   . Irregular heart beat   . Osteoarthritis   . Pneumothorax after biopsy   . [name] (premature ventricular contraction)   . RA (rheumatoid arthritis) ([place])   . Thyroid disease     Past Surgical History:  Procedure Laterality Date  . ABDOMINAL HYSTERECTOMY  2000  . BREAST SURGERY    . CARDIOVASCULAR STRESS TEST  09/02/2005   Cardiolite Myocardial perfusion study; NegativeBruce protocol exercise stress test  . NASAL SINUS SURGERY    . TUBAL LIGATION    . US ECHOCARDIOGRAPHY  12/26/2011   mild abnormalities    Social History:  reports that she quit smoking about 15 years ago. Her smoking  use included cigarettes. She does not have any smokeless tobacco history on file. She reports that she does not drink alcohol or use drugs.  Family History:  Family History  Problem Relation Age of Onset  . Hypertension Mother   . Cancer - Lung Father 17       deceased  . Heart attack Maternal Grandmother   . Cancer Maternal Grandfather   . Diabetes Paternal Grandfather   . Diabetes Brother   . Cancer - Colon Sister      Prior to Admission medications     Medication Sig Start Date End Date Taking? Authorizing Provider  levothyroxine (SYNTHROID, LEVOTHROID) 75 MCG tablet Take 75 mcg by mouth daily.    [provider]  metoprolol succinate (TOPROL-XL) 50 MG 24 hr tablet Take 1-1.5 tablets (50-75 mg total) by mouth daily. 07/30/14   Troy Sine, MD  naproxen sodium (ANAPROX) 220 MG tablet Take 220 mg by mouth daily.    [provider]    Physical Exam: Vitals:   12/17/17 1700 12/17/17 2308  BP: 132/67 128/68  Pulse: 72 69  Resp: 16   Temp: 98.5 F (36.9 C) 98.8 F (37.1 C)  TempSrc: Oral Oral  SpO2: 97% 100%   General: Not in acute distress HEENT: Has a left frontal area       Eyes: PERRL, EOMI, no scleral icterus.       ENT: No discharge from the ears and nose, no pharynx injection, no tonsillar enlargement.        Neck: No JVD, no bruit, no mass felt. Heme: No neck lymph node enlargement. Cardiac: S1/S2, RRR, No murmurs, No gallops or rubs. Respiratory: No rales, wheezing, rhonchi or rubs. GI: Soft, nondistended, nontender, no rebound pain, no organomegaly, BS present. GU: No hematuria Ext: No pitting leg edema bilaterally. 2+DP/PT pulse bilaterally. Musculoskeletal: has tenderness in right hip. Skin: No rashes.  Neuro: Alert, oriented X3, cranial nerves II-XII grossly intact, moves all extremities. Psych: Patient is not psychotic, no suicidal or hemocidal ideation.  Labs on Admission: I have personally reviewed following labs and imaging studies  CBC: Recent Labs  Lab 12/17/17 2324  WBC 9.5  HGB 11.8*  HCT 36.2  MCV 95.0  PLT 737   Basic Metabolic Panel: Recent Labs  Lab 12/17/17 2324  NA 138  K 3.6  CL 105  CO2 24  GLUCOSE 108*  BUN 9  CREATININE 0.67  CALCIUM 8.7*   GFR: CrCl cannot be calculated (Unknown ideal weight.). Liver Function Tests: No results for input(s): AST, ALT, ALKPHOS, BILITOT, PROT, ALBUMIN in the last 168 hours. No results for input(s): LIPASE, AMYLASE in the  last 168 hours. No results for input(s): AMMONIA in the last 168 hours. Coagulation Profile: Recent Labs  Lab 12/17/17 2101  INR 1.07   Cardiac Enzymes: No results for input(s): CKTOTAL, CKMB, CKMBINDEX, TROPONINI in the last 168 hours. BNP (last 3 results) No results for input(s): PROBNP in the last 8760 hours. HbA1C: No results for input(s): HGBA1C in the last 72 hours. CBG: No results for input(s): GLUCAP in the last 168 hours. Lipid Profile: No results for input(s): CHOL, HDL, LDLCALC, TRIG, CHOLHDL, LDLDIRECT in the last 72 hours. Thyroid Function Tests: No results for input(s): TSH, T4TOTAL, FREET4, T3FREE, THYROIDAB in the last 72 hours. Anemia Panel: No results for input(s): VITAMINB12, FOLATE, FERRITIN, TIBC, IRON, RETICCTPCT in the last 72 hours. Urine analysis: No results found for: COLORURINE, APPEARANCEUR, [place], [place], [place], [place], [place], [place], [place],  UROBILINOGEN, NITRITE, LEUKOCYTESUR Sepsis Labs: '@LABRCNTIP' (procalcitonin:4,lacticidven:4) )No results found for this or any previous visit (from the past 240 hour(s)).   Radiological Exams on Admission: Ct Head Wo Contrast  Result Date: 12/17/2017 CLINICAL DATA:  67 year old female with fall and head trauma. EXAM: CT HEAD WITHOUT CONTRAST TECHNIQUE: Contiguous axial images were obtained from the base of the skull through the vertex without intravenous contrast. COMPARISON:  Brain MRI dated 07/08/2011 FINDINGS: Brain: There is mild cortical volume loss. The gray-white matter discrimination is preserved. There is no acute intracranial hemorrhage. No mass effect or midline shift noted. No extra-axial fluid collection. Vascular: No hyperdense vessel or unexpected calcification. Skull: Normal. Negative for fracture or focal lesion. Sinuses/Orbits: No acute finding. Other: Minimal left forehead contusion. IMPRESSION: No acute intracranial pathology. Electronically Signed   By: Anner Crete M.D.   On:  12/17/2017 22:41     EKG: Independently reviewed.  Sinus rhythm, QTC 14, low voltage, nonspecific T wave change.   Assessment/Plan Principal Problem:   Fracture of femoral neck, right (HCC) Active Problems:   Fall   Hypothyroidism   COPD (chronic obstructive pulmonary disease) (HCC)   [name] (premature ventricular contraction)   RA (rheumatoid arthritis) (HCC)   Fracture of femoral neck, right ([place]): pt has mild right foot numbness, but no weakness in leg. No vascular compromise.  Orthopedic surgeon, Dr. Griffin Basil was consulted.  - will admit to Med-surg bed - Pain control: prn tylenol and IV ketorolac (patient is allergic to morphine related Narcotics) and 200 mg of ibuprofen tid - When necessary Zofran for nausea - prn Flexeril for muscle spasm - type and cross - INR/PTT - PT/OT when able to (not ordered now)  Fall: seems to be Dealer fall. CT-head her is negativ for acute intracranial abnormalities. - PT/OT when able to (not ordered now)  Hypothyroidism:  -continue Synthroid  COPD (chronic obstructive pulmonary disease) ([place]): stable -albuterol nebs  Hx of [name] (premature ventricular contraction):  -Continue metoprolol  RA (rheumatoid arthritis) ([place]):  -contin methotrexate and leflunomide -tele monitoring   DVT ppx: SCD Code Status: Full code Family Communication:  Yes, patient's sister and brother   at bed side Disposition Plan:  Anticipate discharge back to previous home environment Consults called:  Dr. Griffin Basil of ortho Admission status:   Inpatient/tele     Date of Service 12/18/2017    Ivor Costa [facility] Pager [phone]  If 7PM-7AM, please contact night-coverage [email or web address] Password [email or web address] 12/18/2017, 1:58 AM

## 2017-12-17 NOTE — Progress Notes (Signed)
Dr. Blaine Hamper advised RN to notify Dr. Griffin Basil of patient's arrival from a direct admission from an outside hospital facility. RN notified [employer] of the same. Nursing will continue to monitor.

## 2017-12-17 NOTE — Progress Notes (Signed)
Flow Manager called to notify Attending of patient's direct admission arrival from [place] will continue to monitor.

## 2017-12-18 ENCOUNTER — Encounter (HOSPITAL_COMMUNITY): Payer: Self-pay | Admitting: Internal Medicine

## 2017-12-18 ENCOUNTER — Inpatient Hospital Stay (HOSPITAL_COMMUNITY): Payer: Medicare Other

## 2017-12-18 ENCOUNTER — Inpatient Hospital Stay (HOSPITAL_COMMUNITY): Payer: Medicare Other | Admitting: Certified Registered"

## 2017-12-18 ENCOUNTER — Other Ambulatory Visit: Payer: Self-pay

## 2017-12-18 ENCOUNTER — Encounter (HOSPITAL_COMMUNITY): Admission: AD | Disposition: A | Discharge status: Home / Self Care | Payer: Self-pay | Attending: Internal Medicine

## 2017-12-18 DIAGNOSIS — M069 Rheumatoid arthritis, unspecified: Secondary | ICD-10-CM

## 2017-12-18 HISTORY — PX: HIP PINNING,CANNULATED: SHX1758

## 2017-12-18 LAB — BASIC METABOLIC PANEL
Anion gap: 9 (ref 5–15)
BUN: 9 mg/dL (ref 8–23)
CHLORIDE: 105 mmol/L (ref 98–111)
CO2: 24 mmol/L (ref 22–32)
Calcium: 8.7 mg/dL — ABNORMAL LOW (ref 8.9–10.3)
Creatinine, Ser: 0.67 mg/dL (ref 0.44–1.00)
GFR calc Af Amer: >60 mL/min (ref >60)
GFR calc non Af Amer: >60 mL/min (ref >60)
Glucose, Bld: 108 mg/dL — ABNORMAL HIGH (ref 70–99)
POTASSIUM: 3.6 mmol/L (ref 3.5–5.1)
SODIUM: 138 mmol/L (ref 135–145)

## 2017-12-18 LAB — TYPE AND SCREEN
ABO/RH(D): A NEG
Antibody Screen: NEGATIVE

## 2017-12-18 LAB — CBC
HCT: 36.2 % (ref 36.0–46.0)
HEMOGLOBIN: 11.8 g/dL — AB (ref 12.0–15.0)
MCH: 31 pg (ref 26.0–34.0)
MCHC: 32.6 g/dL (ref 30.0–36.0)
MCV: 95 fL (ref 78.0–100.0)
Platelets: 251 10*3/uL (ref 150–400)
RBC: 3.81 MIL/uL — ABNORMAL LOW (ref 3.87–5.11)
RDW: 13.6 % (ref 11.5–15.5)
WBC: 9.5 10*3/uL (ref 4.0–10.5)

## 2017-12-18 LAB — MRSA PCR SCREENING: MRSA by PCR: NEGATIVE

## 2017-12-18 LAB — ABO/RH: ABO/RH(D): A NEG

## 2017-12-18 LAB — HIV ANTIBODY (ROUTINE TESTING W REFLEX): HIV Screen 4th Generation wRfx: NONREACTIVE

## 2017-12-18 SURGERY — FIXATION, FEMUR, NECK, PERCUTANEOUS, USING SCREW
Anesthesia: General | Site: Hip | Laterality: Right

## 2017-12-18 MED ORDER — FENTANYL CITRATE (PF) 250 MCG/5ML IJ SOLN
INTRAMUSCULAR | Status: AC
  Filled 2017-12-18: qty 5

## 2017-12-18 MED ORDER — HYDROMORPHONE HCL 1 MG/ML IJ SOLN
INTRAMUSCULAR | Status: AC
  Filled 2017-12-18: qty 1

## 2017-12-18 MED ORDER — PHENYLEPHRINE HCL 10 MG/ML IJ SOLN
INTRAMUSCULAR | Status: DC | PRN
  Administered 2017-12-18: 160 ug via INTRAVENOUS
  Administered 2017-12-18: 240 ug via INTRAVENOUS

## 2017-12-18 MED ORDER — ONDANSETRON HCL 4 MG/2ML IJ SOLN
INTRAMUSCULAR | Status: AC
  Filled 2017-12-18: qty 2

## 2017-12-18 MED ORDER — HYDROMORPHONE HCL 1 MG/ML IJ SOLN
0.2500 mg | INTRAMUSCULAR | Status: DC | PRN
  Administered 2017-12-18 (×2): 0.25 mg via INTRAVENOUS

## 2017-12-18 MED ORDER — METOCLOPRAMIDE HCL 5 MG/ML IJ SOLN: 10.0000 mg | Freq: Once | INTRAMUSCULAR | Status: DC | PRN

## 2017-12-18 MED ORDER — FENTANYL CITRATE (PF) 100 MCG/2ML IJ SOLN
INTRAMUSCULAR | Status: DC | PRN
  Administered 2017-12-18 (×3): 50 ug via INTRAVENOUS

## 2017-12-18 MED ORDER — MEPERIDINE HCL 50 MG/ML IJ SOLN: 6.2500 mg | INTRAMUSCULAR | Status: DC | PRN

## 2017-12-18 MED ORDER — DOCUSATE SODIUM 100 MG PO CAPS
100.0000 mg | ORAL_CAPSULE | Freq: Two times a day (BID) | ORAL | Status: DC
  Administered 2017-12-18 – 2017-12-20 (×4): 100 mg via ORAL
  Filled 2017-12-18 (×4): qty 1

## 2017-12-18 MED ORDER — LACTATED RINGERS IV SOLN
INTRAVENOUS | Status: DC
  Administered 2017-12-18 (×2): via INTRAVENOUS

## 2017-12-18 MED ORDER — LIDOCAINE HCL (CARDIAC) PF 100 MG/5ML IV SOSY
PREFILLED_SYRINGE | INTRAVENOUS | Status: DC | PRN
  Administered 2017-12-18: 60 mg via INTRAVENOUS

## 2017-12-18 MED ORDER — DEXAMETHASONE SODIUM PHOSPHATE 10 MG/ML IJ SOLN
INTRAMUSCULAR | Status: DC | PRN
  Administered 2017-12-18: 10 mg via INTRAVENOUS

## 2017-12-18 MED ORDER — ONDANSETRON HCL 4 MG/2ML IJ SOLN
4.0000 mg | Freq: Four times a day (QID) | INTRAMUSCULAR | Status: DC | PRN
  Administered 2017-12-18: 4 mg via INTRAVENOUS
  Filled 2017-12-18: qty 2

## 2017-12-18 MED ORDER — SUGAMMADEX SODIUM 200 MG/2ML IV SOLN
INTRAVENOUS | Status: AC
  Filled 2017-12-18: qty 2

## 2017-12-18 MED ORDER — PROPOFOL 10 MG/ML IV BOLUS
INTRAVENOUS | Status: DC | PRN
  Administered 2017-12-18: 120 mg via INTRAVENOUS

## 2017-12-18 MED ORDER — ONDANSETRON HCL 4 MG PO TABS
4.0000 mg | ORAL_TABLET | Freq: Four times a day (QID) | ORAL | Status: DC | PRN
  Filled 2017-12-18: qty 1

## 2017-12-18 MED ORDER — METOCLOPRAMIDE HCL 5 MG/ML IJ SOLN
5.0000 mg | Freq: Three times a day (TID) | INTRAMUSCULAR | Status: DC | PRN
  Administered 2017-12-18: 10 mg via INTRAVENOUS
  Filled 2017-12-18: qty 2

## 2017-12-18 MED ORDER — SUGAMMADEX SODIUM 200 MG/2ML IV SOLN
INTRAVENOUS | Status: DC | PRN
  Administered 2017-12-18: 260 mg via INTRAVENOUS

## 2017-12-18 MED ORDER — CEFAZOLIN SODIUM-DEXTROSE 2-4 GM/100ML-% IV SOLN
2.0000 g | Freq: Once | INTRAVENOUS | Status: AC
  Administered 2017-12-18: 2 g via INTRAVENOUS
  Filled 2017-12-18: qty 100

## 2017-12-18 MED ORDER — IBUPROFEN 200 MG PO TABS
200.0000 mg | ORAL_TABLET | Freq: Three times a day (TID) | ORAL | Status: DC
  Administered 2017-12-18 – 2017-12-20 (×6): 200 mg via ORAL
  Filled 2017-12-18 (×6): qty 1

## 2017-12-18 MED ORDER — ONDANSETRON HCL 4 MG/2ML IJ SOLN
INTRAMUSCULAR | Status: DC | PRN
  Administered 2017-12-18: 4 mg via INTRAVENOUS

## 2017-12-18 MED ORDER — METOCLOPRAMIDE HCL 5 MG PO TABS: 5.0000 mg | ORAL_TABLET | Freq: Three times a day (TID) | ORAL | Status: DC | PRN

## 2017-12-18 MED ORDER — ROCURONIUM BROMIDE 10 MG/ML (PF) SYRINGE
PREFILLED_SYRINGE | INTRAVENOUS | Status: AC
  Filled 2017-12-18: qty 10

## 2017-12-18 MED ORDER — CEFAZOLIN SODIUM-DEXTROSE 2-4 GM/100ML-% IV SOLN
2.0000 g | Freq: Four times a day (QID) | INTRAVENOUS | Status: AC
  Administered 2017-12-18 – 2017-12-19 (×2): 2 g via INTRAVENOUS
  Filled 2017-12-18 (×2): qty 100

## 2017-12-18 MED ORDER — MENTHOL 3 MG MT LOZG: 1.0000 | LOZENGE | OROMUCOSAL | Status: DC | PRN

## 2017-12-18 MED ORDER — ROCURONIUM BROMIDE 100 MG/10ML IV SOLN
INTRAVENOUS | Status: DC | PRN
  Administered 2017-12-18: 50 mg via INTRAVENOUS

## 2017-12-18 MED ORDER — OXYCODONE HCL 5 MG PO TABS: 5.0000 mg | ORAL_TABLET | ORAL | Status: DC | PRN

## 2017-12-18 MED ORDER — MIDAZOLAM HCL 5 MG/5ML IJ SOLN
INTRAMUSCULAR | Status: DC | PRN
  Administered 2017-12-18: 2 mg via INTRAVENOUS

## 2017-12-18 MED ORDER — PHENYLEPHRINE 40 MCG/ML (10ML) SYRINGE FOR IV PUSH (FOR BLOOD PRESSURE SUPPORT)
PREFILLED_SYRINGE | INTRAVENOUS | Status: AC
  Filled 2017-12-18: qty 10

## 2017-12-18 MED ORDER — DEXAMETHASONE SODIUM PHOSPHATE 10 MG/ML IJ SOLN
INTRAMUSCULAR | Status: AC
  Filled 2017-12-18: qty 1

## 2017-12-18 MED ORDER — MIDAZOLAM HCL 2 MG/2ML IJ SOLN
INTRAMUSCULAR | Status: AC
  Filled 2017-12-18: qty 2

## 2017-12-18 MED ORDER — PHENOL 1.4 % MT LIQD: 1.0000 | OROMUCOSAL | Status: DC | PRN

## 2017-12-18 MED ORDER — ENOXAPARIN SODIUM 40 MG/0.4ML ~~LOC~~ SOLN
40.0000 mg | SUBCUTANEOUS | Status: DC
  Administered 2017-12-19 – 2017-12-20 (×2): 40 mg via SUBCUTANEOUS
  Filled 2017-12-18 (×2): qty 0.4

## 2017-12-18 MED ORDER — HYDROMORPHONE HCL 1 MG/ML IJ SOLN: 0.5000 mg | INTRAMUSCULAR | Status: DC | PRN

## 2017-12-18 MED ORDER — ALBUTEROL SULFATE (2.5 MG/3ML) 0.083% IN NEBU: 2.5000 mg | INHALATION_SOLUTION | RESPIRATORY_TRACT | Status: DC | PRN

## 2017-12-18 SURGICAL SUPPLY — 44 items
BIT DRILL 4.8X300 (BIT) ×1 IMPLANT
BNDG COHESIVE 4X5 TAN STRL (GAUZE/BANDAGES/DRESSINGS) ×2 IMPLANT
BNDG GAUZE ELAST 4 BULKY (GAUZE/BANDAGES/DRESSINGS) ×2 IMPLANT
CLSR STERI-STRIP ANTIMIC 1/2X4 (GAUZE/BANDAGES/DRESSINGS) ×2 IMPLANT
COVER PERINEAL POST (MISCELLANEOUS) ×2 IMPLANT
COVER SURGICAL LIGHT HANDLE (MISCELLANEOUS) ×2 IMPLANT
DRAPE C-ARM 42X72 X-RAY (DRAPES) ×1 IMPLANT
DRAPE C-ARMOR (DRAPES) ×1 IMPLANT
DRAPE ORTHO SPLIT 77X108 STRL (DRAPES) ×4
DRAPE STERI IOBAN 125X83 (DRAPES) ×2 IMPLANT
DRAPE SURG ORHT 6 SPLT 77X108 (DRAPES) IMPLANT
DRAPE U-SHAPE 47X51 STRL (DRAPES) ×2 IMPLANT
DRAPE UNIVERSAL PACK (DRAPES) ×1 IMPLANT
DRSG AQUACEL AG ADV 3.5X 4 (GAUZE/BANDAGES/DRESSINGS) ×1 IMPLANT
DRSG MEPILEX BORDER 4X4 (GAUZE/BANDAGES/DRESSINGS) ×6 IMPLANT
DURAPREP 26ML APPLICATOR (WOUND CARE) ×2 IMPLANT
ELECT REM PT RETURN 9FT ADLT (ELECTROSURGICAL) ×2
ELECTRODE REM PT RTRN 9FT ADLT (ELECTROSURGICAL) ×1 IMPLANT
GLOVE BIOGEL PI IND STRL 8 (GLOVE) ×1 IMPLANT
GLOVE BIOGEL PI INDICATOR 8 (GLOVE) ×1
GLOVE BIOGEL PI ORTHO PRO SZ8 (GLOVE)
GLOVE ECLIPSE 8.0 STRL XLNG CF (GLOVE) ×4 IMPLANT
GLOVE PI ORTHO PRO STRL SZ8 (GLOVE) IMPLANT
GLOVE SURG ORTHO 8.0 STRL STRW (GLOVE) IMPLANT
GOWN STRL REUS W/ TWL LRG LVL3 (GOWN DISPOSABLE) ×2 IMPLANT
GOWN STRL REUS W/TWL 2XL LVL3 (GOWN DISPOSABLE) IMPLANT
GOWN STRL REUS W/TWL LRG LVL3 (GOWN DISPOSABLE) ×4
KIT TURNOVER KIT B (KITS) ×2 IMPLANT
MANIFOLD NEPTUNE II (INSTRUMENTS) ×1 IMPLANT
NS IRRIG 1000ML POUR BTL (IV SOLUTION) ×2 IMPLANT
PACK GENERAL/GYN (CUSTOM PROCEDURE TRAY) ×2 IMPLANT
PAD ARMBOARD 7.5X6 YLW CONV (MISCELLANEOUS) ×2 IMPLANT
PIN GUIDE DRILL TIP 2.8X300 (DRILL) ×3 IMPLANT
SCREW 8.0X80MMX16 (Screw) ×1 IMPLANT
SCREW CANN 8.0X85 HIP (Screw) ×1 IMPLANT
SCREW PARTIAL THREAD 8.0X90MM (Screw) ×1 IMPLANT
STOCKINETTE IMPERVIOUS LG (DRAPES) ×1 IMPLANT
SUT MNCRL AB 4-0 PS2 18 (SUTURE) ×2 IMPLANT
SUT VIC AB 0 CT1 27 (SUTURE) ×2
SUT VIC AB 0 CT1 27XBRD ANBCTR (SUTURE) ×1 IMPLANT
SUT VIC AB 2-0 CT1 27 (SUTURE) ×2
SUT VIC AB 2-0 CT1 TAPERPNT 27 (SUTURE) ×1 IMPLANT
TOWEL OR 17X24 6PK STRL BLUE (TOWEL DISPOSABLE) ×2 IMPLANT
TOWEL OR 17X26 10 PK STRL BLUE (TOWEL DISPOSABLE) ×2 IMPLANT

## 2017-12-18 NOTE — Anesthesia Preprocedure Evaluation (Signed)
Anesthesia Evaluation    Airway Mallampati: II  TM Distance: >3 FB Neck ROM: Full    Dental no notable dental hx. (+) Teeth Intact   Pulmonary COPD,  COPD inhaler, former smoker,    Pulmonary exam normal breath sounds clear to auscultation       Cardiovascular negative cardio ROS Normal cardiovascular exam Rhythm:Regular Rate:Normal     Neuro/Psych  Neuromuscular disease negative psych ROS   GI/Hepatic negative GI ROS, Neg liver ROS,   Endo/Other  Hypothyroidism   Renal/GU negative Renal ROS  negative genitourinary   Musculoskeletal  (+) Arthritis , Rheumatoid disorders,  Fibromyalgia -Right impacted femoral neck fracture   Abdominal   Peds  Hematology negative hematology ROS (+)   Anesthesia Other Findings   Reproductive/Obstetrics                             Anesthesia Physical Anesthesia Plan  ASA: III  Anesthesia Plan: General   Post-op Pain Management:    Induction: Intravenous  PONV Risk Score and Plan: 4 or greater and Midazolam, Ondansetron, Treatment may vary due to age or medical condition and Dexamethasone  Airway Management Planned: Oral ETT and LMA  Additional Equipment:   Intra-op Plan:   Post-operative Plan: Extubation in OR  Informed Consent: I have reviewed the patients History and Physical, chart, labs and discussed the procedure including the risks, benefits and alternatives for the proposed anesthesia with the patient or authorized representative who has indicated his/her understanding and acceptance.   Dental advisory given  Plan Discussed with: CRNA and Surgeon  Anesthesia Plan Comments:         Anesthesia Quick Evaluation

## 2017-12-18 NOTE — Anesthesia Procedure Notes (Signed)

## 2017-12-18 NOTE — Op Note (Signed)
Orthopaedic Surgery Operative Note (CSN: [id number])  Dominique Lee  1950/10/07 Date of Surgery: 12/17/2017 - 12/18/2017   Diagnoses:  impacted right femoral neck fracture  Procedure: Right percutaneous pinning femoral neck fracture 27235   Operative Finding Successful completion of planned procedure.  Good purchase no all screws, around the world views demonstrated no perforation of screws with at least 2 mm between screw and subchondral bone throughout  Post-operative plan: The patient will be TDWB.  The patient will be readmitted to floor.  DVT prophylaxis lovenox 40mg  qd.  Pain control with PRN pain medication preferring oral medicines.  Follow up plan will be scheduled in approximately 7 days for incision check and XR.  Post-Op Diagnosis: Same Surgeons:Primary: Hiram Gash, MD Assistants:Brandon parry OPAC Location: [facility] OR ROOM 07 Anesthesia: General Antibiotics: Ancef 2g preop Tourniquet time: none Estimated Blood Loss: minimal Complications: None Specimens: None Implants: Implant Name Type Inv. Item Serial No. Manufacturer Lot No. LRB No. Used Action  SCREW [id number] - [id number] Screw SCREW [id number]  [employer](ORTH,TRAU,BIO,SG)  Right 1 Implanted  SCREW CANN 8.0X85 HIP - [id number] Screw SCREW CANN 8.0X85 HIP  [employer](ORTH,TRAU,BIO,SG)  Right 1 Implanted  SCREW PARTIAL THREAD 8.0X90MM - [id number] Screw SCREW PARTIAL THREAD 8.0X90MM  [employer](ORTH,TRAU,BIO,SG)  Right 1 Implanted    Indications for Surgery:   Dominique Lee is a 67 y.o. female with fall resulting in above fracture, no antecedent symptoms.  Benefits and risks of operative and nonoperative management were discussed prior to surgery with patient/guardian(s) and informed consent form was completed.  Specific risks including infection, need for additional surgery, failure of fixation, AVN and need for conversion, non-union.   Procedure:   The patient was identified in the preoperative holding  area where the surgical site was marked. The patient was taken to the OR where a procedural timeout was called and the above noted anesthesia was induced.  The patient was positioned supine on radiolucent bed.  Preoperative antibiotics were dosed.  The patient's right hip was prepped and draped in the usual sterile fashion.  A second preoperative timeout was called.      We used orthogonal fluoroscopy to confirm the fracture did not displace.  At this point we used fluoro to guide percutaneous placement of 3 [employer]omet 8.0 mm titanium cannulated screws on orthogonal views.  This was performed in an inverted triangle.    We placed K wires, drilled, and then placed partially treaded screws after countersinking each.  Purchase was impressive with all screws.  Around the world fluoroscopy confirmed position and lack of articular involvement.    The incision was thoroughly irrigated and closed in a multilayer fashion with absorbable sutures. A sterile dressing was placed.  The patient was awoken from general anesthesia and taken to the PACU in stable condition without complication.   Joya Gaskins, OPA-C, present and scrubbed throughout the case, critical for completion in a timely fashion, and for retraction, instrumentation, closure.

## 2017-12-18 NOTE — Anesthesia Postprocedure Evaluation (Signed)
Anesthesia Post Note  Patient: Dominique Lee  Procedure(s) Performed: CANNULATED HIP PINNING (Right Hip)     Patient location during evaluation: PACU Anesthesia Type: General Level of consciousness: awake and alert Pain management: pain level controlled Vital Signs Assessment: post-procedure vital signs reviewed and stable Respiratory status: spontaneous breathing, nonlabored ventilation, respiratory function stable and patient connected to nasal cannula oxygen Cardiovascular status: blood pressure returned to baseline and stable Postop Assessment: no apparent nausea or vomiting Anesthetic complications: no    Last Vitals:  Vitals:   12/18/17 1640 12/18/17 1655  BP: 126/66 133/69  Pulse: 67 (!) 58  Resp: 12 15  Temp:  (!) 36.3 C  SpO2: 98% 98%    Last Pain:  Vitals:   12/18/17 1655  TempSrc:   PainSc: Asleep        RLE Motor Response: Purposeful movement;Responds to commands (12/18/17 1655) RLE Sensation: No numbness;No tingling (12/18/17 1655)      Javae Braaten COKER

## 2017-12-18 NOTE — Transfer of Care (Signed)
Immediate Anesthesia Transfer of Care Note  Patient: Dominique Lee  Procedure(s) Performed: CANNULATED HIP PINNING (Right Hip)  Patient Location: PACU  Anesthesia Type:General  Level of Consciousness: awake and patient cooperative  Airway & Oxygen Therapy: Patient Spontanous Breathing  Post-op Assessment: Report given to RN and Post -op Vital signs reviewed and stable  Post vital signs: Reviewed and stable  Last Vitals:  Vitals Value Taken Time  BP 144/65 12/18/2017  3:25 PM  Temp    Pulse 63 12/18/2017  3:27 PM  Resp 11 12/18/2017  3:27 PM  SpO2 97 % 12/18/2017  3:27 PM  Vitals shown include unvalidated device data.  Last Pain:  Vitals:   12/18/17 1525  TempSrc:   PainSc: (P) 0-No pain         Complications: No apparent anesthesia complications

## 2017-12-18 NOTE — Progress Notes (Signed)
Patient ID: Dominique Lee, female   DOB: 08-12-1950, 67 y.o.   MRN: [id number]                                                                PROGRESS NOTE                                                                                                                                                                                                             Patient Demographics:    Dominique Lee, is a 67 y.o. female, DOB - March 10, 1951, OIL:[id number]  Admit date - 12/17/2017   Admitting Physician Courage Denton Brick, MD  Outpatient Primary MD for the patient is Dominique Lincoln, MD  LOS - 1  Outpatient Specialists:    No chief complaint on file. impacted right femoral neck fracture     Brief Narrative  67 y.o. female with medical history significant of rheumatoid arthritis, hyperlipidemia, COPD, fibromyalgia, [name], hypothyroidism, COPD, who presents with fall, right hip pain.  Pt is transferred her from [facility] due to right hip fracture.   Pt states that she fell accidentally in [place]. She hurt her head and developed hematoma in left frontal area. She also developed severe pain in the right hip, which is constant, sharp, nonradiating, 8 out of 10 severity, aggravated by movement.  She also reports mild numbness in the right foot.  No leg weakness.  No loss control for bowel movement or bladder.  Patient does not have chest pain, shortness of breath, cough, fever or chills.  No nausea, vomiting, diarrhea, abdominal pain, symptoms of UTI.  Of note, multiple myeloma with bone metastasized is on her medical problem list, but the patient denies this history.  I remove this diagnosis from medical problem list.  Patient states that she is taking metoprolol for [name].  Pt was seen initially in [facility] and found to have " acute minimally impacted subcapital right femoral neck fracture and small cystocele" by CT scan. They consulted orthopedic surgeon, Dr. Griffin Basil who agreed to consult on  this case.  Patient is transferred here.  P was found to have WBC 12.4, electrolytes renal function okay, temperature normal, no tachycardia, no tachypnea, oxygen saturation 97% on room air, negative chest x-ray.  Patient is admitted to telemetry bed as inpatient.  Subjective:    Dominique Lee today has still some pain in the right hip.  Orthopedics is currentlyl seeing the patient and is planning to take her to the OR.    No headache, No chest pain, No abdominal pain - No Nausea, No new weakness tingling or numbness, No Cough - SOB.   Assessment  & Plan :    Principal Problem:   Fracture of femoral neck, right (HCC) Active Problems:   Fall   Hypothyroidism   COPD (chronic obstructive pulmonary disease) (HCC)   [name] (premature ventricular contraction)   RA (rheumatoid arthritis) (HCC)     Fracture of femoral neck, right [facility]):  Orthopedic surgeon, Dr. Griffin Basil was consulted. NPO Medically cleared to have surgery OR today PT/OT when able to (not ordered now)  Fall: seems to be Dealer fall.  CT-head her is negativ for acute intracranial abnormalities. PT/OT when able to (not ordered now)  Hypothyroidism:  Continue Synthroid  COPD (chronic obstructive pulmonary disease) ([place]): stable Albuterol nebs  Hx of [name] (premature ventricular contraction):  Continue metoprolol  RA (rheumatoid arthritis) ([place]):  Cont methotrexate and leflunomide Tele monitoring   DVT ppx: SCD,  Start lovenox 48m Wheeler qday at 12/19/2017 at 0800 per orthopedics Code Status: Full code Family Communication:  w patient Disposition Plan:  Anticipate discharge back to previous home environment Consults called:  Dr. VGriffin Basilof ortho Admission status:   Inpatient/tele      Anti-infectives (From admission, onward)   None        Objective:   Vitals:   12/17/17 1700 12/17/17 2308  BP: 132/67 128/68  Pulse: 72 69  Resp: 16   Temp: 98.5 F (36.9 C) 98.8 F (37.1 C)  TempSrc:  Oral Oral  SpO2: 97% 100%    Wt Readings from Last 3 Encounters:  05/12/14 70.5 kg (155 lb 6.4 oz)  04/23/13 64.9 kg (143 lb)  02/19/12 70.3 kg (155 lb)     Intake/Output Summary (Last 24 hours) at 12/18/2017 0700 Last data filed at 12/18/2017 0439 Gross per 24 hour  Intake 0 ml  Output -  Net 0 ml     Physical Exam  Awake Alert, Oriented X 3, No new F.N deficits, Normal affect [place].AT,PERRAL Supple Neck,No JVD, No cervical lymphadenopathy appriciated.  Symmetrical Chest wall movement, Good air movement bilaterally, CTAB RRR,No Gallops,Rubs or new Murmurs, No Parasternal Heave +ve B.Sounds, Abd Soft, No tenderness, No organomegaly appriciated, No rebound - guarding or rigidity. No Cyanosis, Clubbing or edema, No new Rash or bruise     Data Review:    CBC Recent Labs  Lab 12/17/17 2324  WBC 9.5  HGB 11.8*  HCT 36.2  PLT 251  MCV 95.0  MCH 31.0  MCHC 32.6  RDW 13.6    Chemistries  Recent Labs  Lab 12/17/17 2324  NA 138  K 3.6  CL 105  CO2 24  GLUCOSE 108*  BUN 9  CREATININE 0.67  CALCIUM 8.7*   ------------------------------------------------------------------------------------------------------------------ No results for input(s): CHOL, HDL, LDLCALC, TRIG, CHOLHDL, LDLDIRECT in the last 72 hours.  No results found for: HGBA1C ------------------------------------------------------------------------------------------------------------------ No results for input(s): TSH, T4TOTAL, T3FREE, THYROIDAB in the last 72 hours.  Invalid input(s): FREET3 ------------------------------------------------------------------------------------------------------------------ No results for input(s): VITAMINB12, FOLATE, FERRITIN, TIBC, IRON, RETICCTPCT in the last 72 hours.  Coagulation profile Recent Labs  Lab 12/17/17 2101  INR 1.07    No results for input(s): DDIMER in the last 72 hours.  Cardiac Enzymes No results for input(s): CKMB,  TROPONINI, MYOGLOBIN in  the last 168 hours.  Invalid input(s): CK ------------------------------------------------------------------------------------------------------------------ No results found for: BNP  Inpatient Medications  Scheduled Meds: . fluticasone  2 spray Each Nare Daily  . folic acid  1 mg Intravenous Daily  . ibuprofen  200 mg Oral TID  . leflunomide  10 mg Oral Daily  . levothyroxine  75 mcg Oral QAC breakfast  . [START ON 12/21/2017] methotrexate  2.5 mg Oral Weekly  . metoprolol succinate  50 mg Oral Daily   Continuous Infusions: . sodium chloride 75 mL/hr (12/18/17 0439)   PRN Meds:.acetaminophen, albuterol, cyclobenzaprine, ketorolac, ondansetron (ZOFRAN) IV, polyethylene glycol, zolpidem  Micro Results No results found for this or any previous visit (from the past 240 hour(s)).  Radiology Reports Ct Head Wo Contrast  Result Date: 12/17/2017 CLINICAL DATA:  67 year old female with fall and head trauma. EXAM: CT HEAD WITHOUT CONTRAST TECHNIQUE: Contiguous axial images were obtained from the base of the skull through the vertex without intravenous contrast. COMPARISON:  Brain MRI dated 07/08/2011 FINDINGS: Brain: There is mild cortical volume loss. The gray-white matter discrimination is preserved. There is no acute intracranial hemorrhage. No mass effect or midline shift noted. No extra-axial fluid collection. Vascular: No hyperdense vessel or unexpected calcification. Skull: Normal. Negative for fracture or focal lesion. Sinuses/Orbits: No acute finding. Other: Minimal left forehead contusion. IMPRESSION: No acute intracranial pathology. Electronically Signed   By: Anner Crete M.D.   On: 12/17/2017 22:41    Time Spent in minutes  30   Jani Gravel M.D on 12/18/2017 at 7:00 AM  Between 7am to 7pm - Pager - [phone]    After 7pm go to [email or web address] - password [facility]  [facility] -  Office  [phone]

## 2017-12-18 NOTE — Consult Note (Signed)
   ORTHOPAEDIC CONSULTATION  REQUESTING PHYSICIAN: Kim, James, MD  Chief Complaint: R hip pain  HPI: Dominique Lee is a 67 y.o. female with  acute traumatic fall from standing resulting in R hip pain and inability to ambulate.  No LOC, at baseline state of health prior to fall.  Patient chart had reported history of multiple myeloma but both internal medicine MD and I clarified and patient denied this history and stated it was false.  Reports significant pain at site of injury, no other areas of pain reported acutely.   Patient is a full ambulating without device prior to injury and has a history of COPD is well controlled rheumatoid arthritis on methotrexate but no other complaints.  Patient does report that she may have felt a shift of the hip when she was moved during transport.   Past Medical History:  Diagnosis Date  . COPD (chronic obstructive pulmonary disease) (HCC)   . Fibromyalgia   . High cholesterol   . Hypothyroidism   . Irregular heart beat   . Osteoarthritis   . Pneumothorax after biopsy   . [name] (premature ventricular contraction)   . RA (rheumatoid arthritis) (HCC)   . Thyroid disease    Past Surgical History:  Procedure Laterality Date  . ABDOMINAL HYSTERECTOMY  2000  . BREAST SURGERY    . CARDIOVASCULAR STRESS TEST  09/02/2005   Cardiolite Myocardial perfusion study; NegativeBruce protocol exercise stress test  . NASAL SINUS SURGERY    . TUBAL LIGATION    . US ECHOCARDIOGRAPHY  12/26/2011   mild abnormalities   Social History   Socioeconomic History  . Marital status: Divorced    Spouse name: Not on file  . Number of children: Not on file  . Years of education: Not on file  . Highest education level: Not on file  Occupational History  . Not on file  Social Needs  . Financial resource strain: Not on file  . Food insecurity:    Worry: Not on file    Inability: Not on file  . Transportation needs:    Medical: Not on file    Non-medical: Not on  file  Tobacco Use  . Smoking status: Former Smoker    Types: Cigarettes    Last attempt to quit: 11/04/2002    Years since quitting: 15.1  Substance and Sexual Activity  . Alcohol use: No  . Drug use: No  . Sexual activity: Not on file  Lifestyle  . Physical activity:    Days per week: Not on file    Minutes per session: Not on file  . Stress: Not on file  Relationships  . Social connections:    Talks on phone: Not on file    Gets together: Not on file    Attends religious service: Not on file    Active member of club or organization: Not on file    Attends meetings of clubs or organizations: Not on file    Relationship status: Not on file  Other Topics Concern  . Not on file  Social History Narrative  . Not on file   Family History  Problem Relation Age of Onset  . Hypertension Mother   . Cancer - Lung Father 71       deceased  . Heart attack Maternal Grandmother   . Cancer Maternal Grandfather   . Diabetes Paternal Grandfather   . Diabetes Brother   . Cancer - Colon Sister    Allergies    Allergen Reactions  . Codeine Other (See Comments)    Agitation   . Morphine And Related Other (See Comments)    agitation   . Statins Other (See Comments)    Joint  pain   Prior to Admission medications   Medication Sig Start Date End Date Taking? Authorizing Provider  acetaminophen (TYLENOL) 500 MG tablet Take 500 mg by mouth every 6 (six) hours as needed for mild pain, moderate pain, fever or headache.   Yes [provider]  fluticasone (FLONASE) 50 MCG/ACT nasal spray Place 1 spray into both nostrils daily as needed for allergies or rhinitis.   Yes [provider]  folic acid (FOLVITE) 1 MG tablet Take 1 mg by mouth daily at 12 noon.   Yes [provider]  gabapentin (NEURONTIN) 300 MG capsule Take 1-2 capsules by mouth daily as needed for pain. 11/07/17  Yes [provider]  leflunomide (ARAVA) 10 MG tablet Take 10 mg by mouth daily at 12  noon.   Yes [provider]  levothyroxine (SYNTHROID, LEVOTHROID) 75 MCG tablet Take 75 mcg by mouth daily.   Yes [provider]  methotrexate 2.5 MG tablet Take 15 mg by mouth once a week. Thursdays   Yes [provider]  metoprolol succinate (TOPROL-XL) 50 MG 24 hr tablet Take 1-1.5 tablets (50-75 mg total) by mouth daily. Patient taking differently: Take 50 mg by mouth at bedtime.  07/30/14  Yes Kelly, Thomas A, MD   Ct Head Wo Contrast  Result Date: 12/17/2017 CLINICAL DATA:  66-year-old female with fall and head trauma. EXAM: CT HEAD WITHOUT CONTRAST TECHNIQUE: Contiguous axial images were obtained from the base of the skull through the vertex without intravenous contrast. COMPARISON:  Brain MRI dated 07/08/2011 FINDINGS: Brain: There is mild cortical volume loss. The gray-white matter discrimination is preserved. There is no acute intracranial hemorrhage. No mass effect or midline shift noted. No extra-axial fluid collection. Vascular: No hyperdense vessel or unexpected calcification. Skull: Normal. Negative for fracture or focal lesion. Sinuses/Orbits: No acute finding. Other: Minimal left forehead contusion. IMPRESSION: No acute intracranial pathology. Electronically Signed   By: Arash  Radparvar M.D.   On: 12/17/2017 22:41   Family History Reviewed and non-contributory, no pertinent history of problems with bleeding or anesthesia      Review of Systems 14 system ROS conducted and negative except for that noted in HPI   OBJECTIVE  Vitals:No data found. General: Alert, no acute distress Cardiovascular: No pedal edema Respiratory: No cyanosis, no use of accessory musculature GI: No organomegaly, abdomen is soft and non-tender Skin: No lesions in the area of chief complaint other than those listed below in MSK exam.  Neurologic: Sensation intact distally save for the below mentioned MSK exam Psychiatric: Patient is competent for consent with normal mood and  affect Lymphatic: No axillary or cervical lymphadenopathy Extremities   RLE: Somewhat externally rotated at rest, ROM deferred. + GS/TA/EHL. Sensation intact in DP/SP/S/S/P distributions. 2+ DP pulse with warm and well perfused digits. Compartments soft and compressible, with no pain on passive stretch.    Test Results Imaging Reviewed CT and x-rays from disc provided from [facility] rocking him.  Minimally angulated and displaced subcapital femoral neck fracture noted with minimal arthritic change within the joint CT confirms the same findings of a right minimally displaced subcapital femoral neck fracture with impaction.  Labs cbc Recent Labs    12/17/17 2324  WBC 9.5  HGB 11.8*  HCT 36.2  PLT 251      Labs inflam No results for input(s): CRP in the last 72 hours.  Invalid input(s): ESR  Labs coag Recent Labs    12/17/17 2101  INR 1.07    Recent Labs    12/17/17 2324  NA 138  K 3.6  CL 105  CO2 24  GLUCOSE 108*  BUN 9  CREATININE 0.67  CALCIUM 8.7*     ASSESSMENT AND PLAN: 66 y.o. female with the following: Right minimally impacted subcapital femoral neck fracture though patient reports a recent shift  Based on the patient's report of a shift in the hip we will obtain new x-rays today.  If the appears similar to her previous films we will plan for a closed reduction and percutaneous pinning.  We talked about the risk of AVN, need for further surgery, infection, and limited weightbearing in the recovery.  She understands this and if the surgery needs to a suboptimal result of total hip arthroplasty next option.  She would like to preserve her own possible.  Understanding that she elected to proceed.  For new x-rays demonstrate a shift we will work on scheduling her for a total hip arthroplasty with one my partners  The risks benefits and alternatives were discussed with the patient including but not limited to the risks of nonoperative treatment, versus surgical  intervention including infection, bleeding, nerve injury, periprosthetic fracture, the need for revision surgery, leg length discrepancy, gait change, blood clots, cardiopulmonary complications, morbidity, mortality, among others, and they were willing to proceed.    We will post for OR today pending XR.   

## 2017-12-19 ENCOUNTER — Encounter (HOSPITAL_COMMUNITY): Payer: Self-pay

## 2017-12-19 ENCOUNTER — Ambulatory Visit: Admit: 2017-12-19 | Payer: BC Managed Care – PPO | Admitting: Orthopedic Surgery

## 2017-12-19 ENCOUNTER — Encounter (HOSPITAL_COMMUNITY): Admission: AD | Disposition: A | Discharge status: Home / Self Care | Payer: Self-pay | Attending: Internal Medicine

## 2017-12-19 LAB — CBC
HEMATOCRIT: 36 % (ref 36.0–46.0)
HEMOGLOBIN: 11.5 g/dL — AB (ref 12.0–15.0)
MCH: 30.6 pg (ref 26.0–34.0)
MCHC: 31.9 g/dL (ref 30.0–36.0)
MCV: 95.7 fL (ref 78.0–100.0)
Platelets: 246 10*3/uL (ref 150–400)
RBC: 3.76 MIL/uL — ABNORMAL LOW (ref 3.87–5.11)
RDW: 13.6 % (ref 11.5–15.5)
WBC: 7.6 10*3/uL (ref 4.0–10.5)

## 2017-12-19 LAB — COMPREHENSIVE METABOLIC PANEL
ALBUMIN: 3.1 g/dL — AB (ref 3.5–5.0)
ALT: 15 U/L (ref 0–44)
AST: 15 U/L (ref 15–41)
Alkaline Phosphatase: 80 U/L (ref 38–126)
Anion gap: 4 — ABNORMAL LOW (ref 5–15)
BILIRUBIN TOTAL: 0.5 mg/dL (ref 0.3–1.2)
BUN: 10 mg/dL (ref 8–23)
CO2: 26 mmol/L (ref 22–32)
Calcium: 7.9 mg/dL — ABNORMAL LOW (ref 8.9–10.3)
Chloride: 107 mmol/L (ref 98–111)
Creatinine, Ser: 0.66 mg/dL (ref 0.44–1.00)
GFR calc Af Amer: >60 mL/min (ref >60)
GFR calc non Af Amer: >60 mL/min (ref >60)
GLUCOSE: 154 mg/dL — AB (ref 70–99)
POTASSIUM: 4.3 mmol/L (ref 3.5–5.1)
Sodium: 137 mmol/L (ref 135–145)
Total Protein: 5.9 g/dL — ABNORMAL LOW (ref 6.5–8.1)

## 2017-12-19 SURGERY — ARTHROPLASTY, HIP, TOTAL, ANTERIOR APPROACH
Anesthesia: General | Laterality: Right

## 2017-12-19 MED ORDER — POLYETHYLENE GLYCOL 3350 17 G PO PACK
17.0000 g | PACK | Freq: Every day | ORAL | Status: DC
  Administered 2017-12-19: 17 g via ORAL
  Filled 2017-12-19 (×2): qty 1

## 2017-12-19 MED ORDER — OXYCODONE HCL 5 MG PO TABS: ORAL_TABLET | ORAL | 0 refills | Status: DC

## 2017-12-19 MED ORDER — ENOXAPARIN SODIUM 40 MG/0.4ML ~~LOC~~ SOLN: 40.0000 mg | SUBCUTANEOUS | 1 refills | Status: AC

## 2017-12-19 MED ORDER — MELOXICAM 7.5 MG PO TABS: 7.5000 mg | ORAL_TABLET | Freq: Every day | ORAL | 2 refills | Status: DC

## 2017-12-19 MED ORDER — ONDANSETRON HCL 4 MG PO TABS: 4.0000 mg | ORAL_TABLET | Freq: Three times a day (TID) | ORAL | 1 refills | Status: DC | PRN

## 2017-12-19 MED ORDER — ENSURE ENLIVE PO LIQD
237.0000 mL | Freq: Two times a day (BID) | ORAL | Status: DC
  Administered 2017-12-19 – 2017-12-20 (×2): 237 mL via ORAL

## 2017-12-19 MED ORDER — ACETAMINOPHEN 500 MG PO TABS: 1000.0000 mg | ORAL_TABLET | Freq: Three times a day (TID) | ORAL | 0 refills | Status: AC

## 2017-12-19 MED ORDER — ACETAMINOPHEN 500 MG PO TABS: 1000.0000 mg | ORAL_TABLET | Freq: Three times a day (TID) | ORAL | 0 refills | Status: DC

## 2017-12-19 MED ORDER — OXYCODONE HCL 5 MG PO TABS: ORAL_TABLET | ORAL | 0 refills | Status: AC

## 2017-12-19 MED ORDER — OMEPRAZOLE 20 MG PO CPDR: 20.0000 mg | DELAYED_RELEASE_CAPSULE | Freq: Every day | ORAL | 0 refills | Status: DC

## 2017-12-19 MED ORDER — OMEPRAZOLE 20 MG PO CPDR: 20.0000 mg | DELAYED_RELEASE_CAPSULE | Freq: Every day | ORAL | 0 refills | Status: AC

## 2017-12-19 MED ORDER — FOLIC ACID 1 MG PO TABS
1.0000 mg | ORAL_TABLET | Freq: Every day | ORAL | Status: DC
  Administered 2017-12-20: 1 mg via ORAL
  Filled 2017-12-19: qty 1

## 2017-12-19 MED ORDER — ONDANSETRON HCL 4 MG PO TABS: 4.0000 mg | ORAL_TABLET | Freq: Three times a day (TID) | ORAL | 1 refills | Status: AC | PRN

## 2017-12-19 NOTE — Progress Notes (Signed)
ORTHOPAEDIC PROGRESS NOTE  s/p Procedure(s): CANNULATED HIP PINNING  SUBJECTIVE: Reports mild pain about operative site. No chest pain. No SOB. No nausea/vomiting. No other complaints.  OBJECTIVE: PE: DPO:EUMPNTIR CDI, leg lengths equal, warm well perfused foot, intact EHL/TA/GSC   Vitals:   12/18/17 2047 12/19/17 0436  BP: 130/63 (!) 133/59  Pulse: (!) 51 (!) 53  Resp: 16   Temp: 97.8 F (36.6 C) (!) 97.5 F (36.4 C)  SpO2: 99% 99%     ASSESSMENT: Dominique Lee is a 68 y.o. female doing well postoperatively.  PLAN: Weightbearing: TDWB RLE Insicional and dressing care: OK to remove dressings 7 days postop and leave open to air with dry gauze PRN Orthopedic device(s): None Showering: prn VTE prophylaxis: Lovenox 40mg  qd 4 weeks Pain control: prns Follow - up plan: 2 weeks Contact information:  Weekdays 8-5 Ophelia Charter MD [phone], After hours and holidays please check [email or web address] for group call information for [facility]  OK FOR DC FROM ORTHO PERSPECTIVE WHEN MOBILIZING

## 2017-12-19 NOTE — Evaluation (Signed)
Physical Therapy Evaluation Patient Details Name: Dominique Lee MRN: [id number] DOB: [birth year] Today's Date: 12/19/2017   History of Present Illness  67 y.o. female with medical history significant of rheumatoid arthritis, hyperlipidemia, COPD, fibromyalgia, [name], hypothyroidism, COPD, who presents with fall, right hip pain after mechanical fall. s/p 12/18/2017 R hip pinning.   Clinical Impression  Patient is s/p above surgery resulting in functional limitations due to the deficits listed below (see PT Problem List). PTA pt independent in all aspects of daily living. Pt currently, requires supervision for bed mobility and minA for transfers and ambulation of 15 feet with RW. Pt experienced lightheadedness with activity, BP and oxygenation WNL (see general comments). Pt would benefit from PT session tomorrow before going home to work on stability with RW. Patient will benefit from skilled HHPT to increase their independence and safety with mobility to allow discharge to the venue listed below.       Follow Up Recommendations Home health PT;Supervision for mobility/OOB    Equipment Recommendations  Rolling walker with 5" wheels    Recommendations for Other Services       Precautions / Restrictions Restrictions Weight Bearing Restrictions: Yes RLE Weight Bearing: Touchdown weight bearing      Mobility  Bed Mobility Overal bed mobility: Needs Assistance Bed Mobility: Supine to Sit     Supine to sit: Supervision     General bed mobility comments: supervision for safety, increased time and effort vc for reaching to bedrail and for scooting hips to EoB  Transfers Overall transfer level: Needs assistance Equipment used: Rolling walker (2 wheeled) Transfers: Sit to/from Stand Sit to Stand: Min assist         General transfer comment: minA for power up and steadying with RW, vc for maintaining weightbearing restrictions  Ambulation/Gait Ambulation/Gait assistance: Min  assist Gait Distance (Feet): 15 Feet Assistive device: Rolling walker (2 wheeled) Gait Pattern/deviations: Step-to pattern;Antalgic;Decreased step length - left;Trunk flexed Gait velocity: slowed Gait velocity interpretation: <1.31 ft/sec, indicative of household ambulator General Gait Details: minA for steadying with hop to pattern, vc for ability to touch down R toes for balance however pt prefers to not TTWB      Balance Overall balance assessment: Needs assistance Sitting-balance support: No upper extremity supported;Feet unsupported Sitting balance-Leahy Scale: Good     Standing balance support: Bilateral upper extremity supported Standing balance-Leahy Scale: Poor                               Pertinent Vitals/Pain Pain Assessment: 0-10 Pain Score: 1  Pain Location: R hip and knee Pain Descriptors / Indicators: Aching;Stabbing;Sore Pain Intervention(s): Limited activity within patient's tolerance;Monitored during session;Premedicated before session;Repositioned    Home Living Family/patient expects to be discharged to:: Private residence Living Arrangements: Alone Available Help at Discharge: Family;Available 24 hours/day Type of Home: House Home Access: Ramped entrance     Home Layout: One level Home Equipment: Grab bars - toilet;Walker - 2 wheels;Bedside commode;Wheelchair - manual      Prior Function Level of Independence: Independent         Comments: independent with all aspects of daily living     Hand Dominance        Extremity/Trunk Assessment   Upper Extremity Assessment Upper Extremity Assessment: Overall WFL for tasks assessed    Lower Extremity Assessment Lower Extremity Assessment: RLE deficits/detail RLE Deficits / Details: R hip strength and ROM limited by post operative pain,  knee and ankle ROM WFL, strength grossly assessed at 4/5 RLE Sensation: WNL(prior R foot tingling has resolved) RLE Coordination: WNL        Communication   Communication: No difficulties  Cognition Arousal/Alertness: Awake/alert Behavior During Therapy: WFL for tasks assessed/performed Overall Cognitive Status: Within Functional Limits for tasks assessed                                        General Comments General comments (skin integrity, edema, etc.): pt reports general lightheadedness after getting up to chair, vitals taken BP 125/56, SaO2 on RA 97%O2, HR 56, pt reports not eating much breakfast, given crackers and Sprite and RN notified, daughter present throughout session    Exercises General Exercises - Lower Extremity Ankle Circles/Pumps: Both;10 reps;AROM;Supine Heel Slides: AROM;Right;5 reps;Supine   Assessment/Plan    PT Assessment Patient needs continued PT services  PT Problem List Decreased strength;Decreased range of motion;Decreased balance;Decreased mobility;Pain;Decreased activity tolerance       PT Treatment Interventions DME instruction;Gait training;Functional mobility training;Therapeutic activities;Therapeutic exercise;Balance training;Patient/family education    PT Goals (Current goals can be found in the Care Plan section)  Acute Rehab PT Goals Patient Stated Goal: go home PT Goal Formulation: With patient/family Time For Goal Achievement: 01/02/18 Potential to Achieve Goals: Good    Frequency 7X/week    AM-PAC PT "6 Clicks" Daily Activity  Outcome Measure Difficulty turning over in bed (including adjusting bedclothes, sheets and blankets)?: A Little Difficulty moving from lying on back to sitting on the side of the bed? : A Lot Difficulty sitting down on and standing up from a chair with arms (e.g., wheelchair, bedside commode, etc,.)?: Unable Help needed moving to and from a bed to chair (including a wheelchair)?: A Little Help needed walking in hospital room?: A Little Help needed climbing 3-5 steps with a railing? : Total 6 Click Score: 13    End of Session  Equipment Utilized During Treatment: Gait belt Activity Tolerance: Patient tolerated treatment well Patient left: in chair;with call bell/phone within reach;with chair alarm set;with family/visitor present Nurse Communication: Mobility status PT Visit Diagnosis: Unsteadiness on feet (R26.81);Other abnormalities of gait and mobility (R26.89);History of falling (Z91.81);Muscle weakness (generalized) (M62.81);Difficulty in walking, not elsewhere classified (R26.2);Pain Pain - Right/Left: Right Pain - part of body: Hip    Time: 2330-0762 PT Time Calculation (min) (ACUTE ONLY): 35 min   Charges:   PT Evaluation $PT Eval Low Complexity: 1 Low PT Treatments $Gait Training: 8-22 mins   PT G Codes:        Dominique Lee B. Migdalia Dk PT, DPT Acute Rehabilitation  [phone] Pager [phone]    [place] 12/19/2017, 11:51 AM

## 2017-12-19 NOTE — Evaluation (Signed)
Occupational Therapy Evaluation Patient Details Name: Dominique Lee MRN: [id number] DOB: 11/27/1950 Today's Date: 12/19/2017    History of Present Illness 67 y.o. female with medical history significant of rheumatoid arthritis, hyperlipidemia, COPD, fibromyalgia, [name], hypothyroidism, COPD, who presents with fall, right hip pain after mechanical fall. s/p 12/18/2017 R hip pinning.    Clinical Impression   Pt reports she was independent with ADL PTA. Currently pt requires min guard assist for functional mobility and min assist for LB ADL. Pt planning to d/c to her mothers house where she will have 24/7 supervision from family. Pt would benefit from continued skilled OT to address established goals.    Follow Up Recommendations  No OT follow up;Supervision - Intermittent    Equipment Recommendations  None recommended by OT    Recommendations for Other Services       Precautions / Restrictions Precautions Precautions: Fall Restrictions Weight Bearing Restrictions: Yes RLE Weight Bearing: Touchdown weight bearing      Mobility Bed Mobility      General bed mobility comments: Pt OOB in chair upon arrival  Transfers Overall transfer level: Needs assistance Equipment used: Rolling walker (2 wheeled) Transfers: Sit to/from Stand Sit to Stand: Min guard         General transfer comment: Good hand placement and technique. Min guard for safety. Pt able to maintain TDWB RLE thoughout    Balance Overall balance assessment: Needs assistance Sitting-balance support: No upper extremity supported;Feet unsupported Sitting balance-Leahy Scale: Good     Standing balance support: Single extremity supported;During functional activity Standing balance-Leahy Scale: Poor Standing balance comment: Unable to maintain static standing without at least single UE support                           ADL either performed or assessed with clinical judgement   ADL Overall ADL's :  Needs assistance/impaired Eating/Feeding: Independent;Sitting   Grooming: Min guard;Standing;Wash/dry hands   Upper Body Bathing: Set up;Supervision/ safety;Sitting   Lower Body Bathing: Minimal assistance;Sit to/from stand   Upper Body Dressing : Set up;Supervision/safety;Sitting   Lower Body Dressing: Minimal assistance;Sit to/from stand   Toilet Transfer: Min guard;Ambulation;RW Toilet Transfer Details (indicate cue type and reason): Simulated by sit to stand from chair with functional mobility in room       Tub/Shower Transfer Details (indicate cue type and reason): Pt planning to sponge bathe initially. Discussed tub bench but pt reports her shower has doors  Functional mobility during ADLs: Min guard;Rolling walker General ADL Comments: Pt able to maintain TDWB on RLE throughout functional activities this session.     Vision         Perception     Praxis      Pertinent Vitals/Pain Pain Assessment: Faces Pain Score:  Faces Pain Scale: Hurts a little bit Pain Location: R hip Pain Descriptors / Indicators: Discomfort Pain Intervention(s): Monitored during session     Hand Dominance     Extremity/Trunk Assessment Upper Extremity Assessment Upper Extremity Assessment: Overall WFL for tasks assessed   Lower Extremity Assessment Lower Extremity Assessment: Defer to PT evaluation    Cervical / Trunk Assessment Cervical / Trunk Assessment: Normal   Communication Communication Communication: No difficulties   Cognition Arousal/Alertness: Awake/alert Behavior During Therapy: WFL for tasks assessed/performed Overall Cognitive Status: Within Functional Limits for tasks assessed  General Comments    Exercises   Shoulder Instructions      Home Living Family/patient expects to be discharged to:: Private residence Living Arrangements: Alone Available Help at Discharge: Family;Available 24 hours/day(daughter  and sister) Type of Home: House Home Access: Ramped entrance     Home Layout: One level     Bathroom Shower/Tub: Tub/shower unit;Door   [employer] Toilet: Standard Bathroom Accessibility: Yes   Home Equipment: Grab bars - toilet;Walker - 2 wheels;Bedside commode;Wheelchair - manual   Additional Comments: Planning to stay at her mothers house initially; above information reflects mothers house      Prior Functioning/Environment Level of Independence: Independent        Comments: independent with all aspects of daily living        OT Problem List: Impaired balance (sitting and/or standing);Decreased knowledge of use of DME or AE;Decreased knowledge of precautions;Pain      OT Treatment/Interventions: Self-care/ADL training;DME and/or AE instruction;Therapeutic activities;Patient/family education;Balance training    OT Goals(Current goals can be found in the care plan section) Acute Rehab OT Goals Patient Stated Goal: go home OT Goal Formulation: With patient/family Time For Goal Achievement: 01/02/18 Potential to Achieve Goals: Good ADL Goals Pt Will Perform Lower Body Bathing: with supervision;sit to/from stand Pt Will Perform Lower Body Dressing: with supervision;sit to/from stand Pt Will Perform Tub/Shower Transfer: Tub transfer;with supervision;ambulating;rolling walker(shower chair vs tub bench?? problem solve)  OT Frequency: Min 2X/week   Barriers to D/C:            Co-evaluation              AM-PAC PT "6 Clicks" Daily Activity     Outcome Measure Help from another person eating meals?: None Help from another person taking care of personal grooming?: A Little Help from another person toileting, which includes using toliet, bedpan, or urinal?: A Little Help from another person bathing (including washing, rinsing, drying)?: A Little Help from another person to put on and taking off regular upper body clothing?: None Help from another person to put on and  taking off regular lower body clothing?: A Little 6 Click Score: 20   End of Session Equipment Utilized During Treatment: Rolling walker  Activity Tolerance: Patient tolerated treatment well Patient left: in chair;with call bell/phone within reach;with chair alarm set;with family/visitor present  OT Visit Diagnosis: Unsteadiness on feet (R26.81);History of falling (Z91.81);Pain Pain - Right/Left: Right Pain - part of body: Hip                Time: 1211-1223 OT Time Calculation (min): 12 min Charges:  OT General Charges $OT Visit: 1 Visit OT Evaluation $OT Eval Moderate Complexity: 1 Mod G-Codes:     Yannely Kintzel A. Ulice Brilliant, M.S., OTR/L Acute Rehab Department: [phone]  Binnie Kand 12/19/2017, 1:17 PM

## 2017-12-19 NOTE — Progress Notes (Signed)
Initial Nutrition Assessment  DOCUMENTATION CODES:   Not applicable  INTERVENTION:   - Ensure Enlive po BID, each supplement provides 350 kcal and 20 grams of protein  - Encourage PO intake  NUTRITION DIAGNOSIS:   Increased nutrient needs related to post-op healing, catabolic illness(COPD) as evidenced by estimated needs.  GOAL:   Patient will meet greater than or equal to 90% of their needs  MONITOR:   PO intake, Supplement acceptance, Labs, Weight trends, Skin  REASON FOR ASSESSMENT:   Consult Assessment of nutrition requirement/status  ASSESSMENT:   67 year old female who was admitted directly from [facility] due to right hip fracture. PMH significant for rheumatoid arthritis, hyperlipidemia, fibromyalgia, [name], hypothyroidism, and COPD.  7/1 - s/p right percutaneous pinning femoral neck fracture  RD attempted to obtain bed weight but pt in recliner at time of visit with limited mobility.  Pt states that breakfast was her first meal since Sunday and that she has a decreased appetite. Pt states she ate half of the eggs, a few bites of potatoes, 1 bite of sausage, and orange juice. Pt reports that she normally is "not a big eater" but does consume 3 meals and snacks daily. A typical breakfast include yogurt, blueberries, and toast. A typical lunch includes 1/2 of a peanut butter sandwich and a piece of fruit with V-8 juice. A typical dinner "varies" but typically includes a meat and 2 vegetables. Pt snacks on apples and peanuts.  Pt reports gaining weight due to prednisone and that her highest weight was 172 lbs. Pt weighed this in early 2018. Pt reports her most current UBW since stopping prednisone and losing weight (some intentional) has been 135-140 lbs. Weight history in chart is limited as last weight recorded is 155 lbs in 2015.  Pt agreeable to trying Ensure Enlive to maximize protein and calorie intake and promote surgical incision healing during admission. Discussed  importance of nutrition in healing and in helping pt get back to her baseline.  Medications reviewed and include: 100 mg Colace BID, 1 mg folic acid daily, 75 mcg levothyroxine daily  Labs reviewed: calcium 7.9 (L), anion gap 4 (L), hemoglobin 11.5 (L)  NUTRITION - FOCUSED PHYSICAL EXAM:    Most Recent Value  Orbital Region  Mild depletion  Upper Arm Region  Mild depletion  Thoracic and Lumbar Region  Mild depletion  Buccal Region  No depletion  Temple Region  Moderate depletion  Clavicle Bone Region  Mild depletion  Clavicle and Acromion Bone Region  Mild depletion  Scapular Bone Region  Unable to assess  Dorsal Hand  Mild depletion  Patellar Region  Mild depletion  Anterior Thigh Region  Mild depletion  Posterior Calf Region  No depletion  Edema (RD Assessment)  None  Hair  Reviewed  Eyes  Reviewed  Mouth  Reviewed  Skin  Reviewed  Nails  Reviewed       Diet Order:   Diet Order           Diet regular Room service appropriate? Yes; Fluid consistency: Thin  Diet effective now          EDUCATION NEEDS:   Education needs have been addressed  Skin:  Skin Assessment: Skin Integrity Issues: Skin Integrity Issues:: Incisions Incisions: closed surgical incision to R hip  Last BM:  12/17/17  Height:   Ht Readings from Last 1 Encounters:  12/18/17 5' 5.5" (1.664 m)    Weight:   Wt Readings from Last 1 Encounters:  12/18/17 143 lb (  64.9 kg)    Ideal Body Weight:  58 kg  BMI:  Body mass index is 23.43 kg/m.  Estimated Nutritional Needs:   Kcal:  1600-1800 kcal/day  Protein:  80-95 grams/day  Fluid:  1.6-1.8 L/day    Gaynell Face, MS, RD, LDN Pager: [phone] Weekend/After Hours: [phone]

## 2017-12-19 NOTE — Care Management (Signed)
Benefit check completed lovenox:   1. LOVENOX 40 MG QD 4 WEEKS SYRINGES  COVER- YES  CO-PAY- $ 64.00  TIER- 3 DRUG  PRIOR APPROVAL- NO   2. ENOXAPARIN  40 MG QD WEEKS  COVER- YES  CO-PAY- $ 53.84  TIER- 3 DRUG  PRIOR APPROVAL- NO   NO DEDUCTIBLE   PREFERRED PHARMACY : YES [facility] AND [facility]

## 2017-12-19 NOTE — Progress Notes (Signed)
Patient ID: Dominique Lee, female   DOB: 14-Jul-1950, 67 y.o.   MRN: [id number]                                                                PROGRESS NOTE                                                                                                                                                                                                             Patient Demographics:    Dominique Lee, is a 67 y.o. female, DOB - 1951-03-05, QTM:[id number]  Admit date - 12/17/2017   Admitting Physician Courage Denton Brick, MD  Outpatient Primary MD for the patient is Elsie Lincoln, MD  LOS - 2  Outpatient Specialists:     No chief complaint on file.    Impacted right femoral neck fracture  Brief Narrative    67 y.o.femalewith medical history significant ofrheumatoid arthritis, hyperlipidemia, COPD, fibromyalgia, [name], hypothyroidism, COPD,whopresents with fall, right hip pain.  Pt is transferred her from [facility] due to right hip fracture.   Pt states that she fell accidentally in [place]. She hurt her head and developedhematoma in left frontal area. Shealso developed severe pain in the right hip, which is constant, sharp, nonradiating, 8 out of 10 severity, aggravated by movement. She also reports mild numbness in the right foot. No leg weakness. No loss control for bowel movement orbladder. Patient does not have chest pain, shortness of breath, cough, fever or chills. No nausea, vomiting, diarrhea, abdominal pain, symptoms of UTI. Of note, multiple myeloma with bone metastasized is on her medical problem list, but the patient denies this history. I remove this diagnosis from medical problem list. Patient states that she is taking metoprolol for [name].  Pt was seen initially in [facility] and found to have "acute minimally impacted subcapital right femoral neck fracture and small cystocele" by CT scan. Theyconsulted orthopedic surgeon, Dr. Griffin Basil who agreed to consult  on this case. Patient is transferred here.  Pwas found to have WBC 12.4, electrolytes renal function okay, temperature normal, no tachycardia, no tachypnea, oxygen saturation 97% on room air, negative chest x-ray. Patient is admitted to telemetry bed as inpatient.         Subjective:    Dominique Lee today has not yet had  a BM yet but no constipation.  Pt states pain controlled. Much more alert this morning.     No headache, No chest pain, No abdominal pain - No Nausea, No new weakness tingling or numbness, No Cough - SOB.    Assessment  & Plan :    Principal Problem:   Fracture of femoral neck, right (HCC) Active Problems:   Fall   Hypothyroidism   COPD (chronic obstructive pulmonary disease) (HCC)   [name] (premature ventricular contraction)   RA (rheumatoid arthritis) (HCC)    Fracture of femoral neck, right [facility]): Right percutaneous pinning femoral neck fracture 7/1 Orthopedic surgeon, Dr. Griffin Basil  Input much appreciated PT/OT to evaluate and tx  Fall: seems to be mechanic fall.  CT-head her is negativ foracute intracranial abnormalities. PT/OT as above  Hypothyroidism:  ContinueSynthroid  COPD (chronic obstructive pulmonary disease) ([place]): stable Albuterol nebs  Hx ofPVC (premature ventricular contraction):  Continue metoprolol  RA (rheumatoid arthritis) ([place]): Contmethotrexate and leflunomide  Mild Anemia likely routine postoperative blood loss Check cbc in am   DVT ppx:SCD,  Start lovenox 67m [place] qday at 12/19/2017 at 0800 per orthopedics Code Status:Full code Family Communication: w patient Disposition Plan: Anticipate discharge back to previous home environment Consults called:Dr. VGriffin Basilof ortho Admission status: Inpatient/tele         Anti-infectives (From admission, onward)   None         Lab Results  Component Value Date   PLT 246 12/19/2017    Antibiotics  :  Ancef 7/1, 7/2  Anti-infectives  (From admission, onward)   Start     Dose/Rate Route Frequency Ordered Stop   12/19/17 0945  ceFAZolin (ANCEF) IVPB 2g/100 mL premix     2 g 200 mL/hr over 30 Minutes Intravenous  Once 12/18/17 0713 12/18/17 1435   12/18/17 2030  ceFAZolin (ANCEF) IVPB 2g/100 mL premix     2 g 200 mL/hr over 30 Minutes Intravenous Every 6 hours 12/18/17 1711 12/19/17 0428        Objective:   Vitals:   12/18/17 1715 12/18/17 2047 12/19/17 0436 12/19/17 0852  BP: 128/69 130/63 (!) 133/59 (!) 118/58  Pulse: 64 (!) 51 (!) 53 60  Resp: 14 16    Temp: 97.8 F (36.6 C) 97.8 F (36.6 C) (!) 97.5 F (36.4 C)   TempSrc: Oral Oral Oral   SpO2: 97% 99% 99%   Weight:      Height:        Wt Readings from Last 3 Encounters:  12/18/17 64.9 kg (143 lb)  05/12/14 70.5 kg (155 lb 6.4 oz)  04/23/13 64.9 kg (143 lb)     Intake/Output Summary (Last 24 hours) at 12/19/2017 0933 Last data filed at 12/19/2017 0425 Gross per 24 hour  Intake 3800.65 ml  Output 20 ml  Net 3780.65 ml     Physical Exam  Awake Alert, Oriented X 3, No new F.N deficits, Normal affect .AT,PERRAL Supple Neck,No JVD, No cervical lymphadenopathy appriciated.  Symmetrical Chest wall movement, Good air movement bilaterally, CTAB RRR,No Gallops,Rubs or new Murmurs, No Parasternal Heave +ve B.Sounds, Abd Soft, No tenderness, No organomegaly appriciated, No rebound - guarding or rigidity. No Cyanosis, Clubbing or edema, No new Rash or bruise     Data Review:    CBC Recent Labs  Lab 12/17/17 2324 12/19/17 0422  WBC 9.5 7.6  HGB 11.8* 11.5*  HCT 36.2 36.0  PLT 251 246  MCV 95.0 95.7  MCH 31.0 30.6  MCHC  32.6 31.9  RDW 13.6 13.6    Chemistries  Recent Labs  Lab 12/17/17 2324 12/19/17 0422  NA 138 137  K 3.6 4.3  CL 105 107  CO2 24 26  GLUCOSE 108* 154*  BUN 9 10  CREATININE 0.67 0.66  CALCIUM 8.7* 7.9*  AST  --  15  ALT  --  15  ALKPHOS  --  80  BILITOT  --  0.5    ------------------------------------------------------------------------------------------------------------------ No results for input(s): CHOL, HDL, LDLCALC, TRIG, CHOLHDL, LDLDIRECT in the last 72 hours.  No results found for: HGBA1C ------------------------------------------------------------------------------------------------------------------ No results for input(s): TSH, T4TOTAL, T3FREE, THYROIDAB in the last 72 hours.  Invalid input(s): FREET3 ------------------------------------------------------------------------------------------------------------------ No results for input(s): VITAMINB12, FOLATE, FERRITIN, TIBC, IRON, RETICCTPCT in the last 72 hours.  Coagulation profile Recent Labs  Lab 12/17/17 2101  INR 1.07    No results for input(s): DDIMER in the last 72 hours.  Cardiac Enzymes No results for input(s): CKMB, TROPONINI, MYOGLOBIN in the last 168 hours.  Invalid input(s): CK ------------------------------------------------------------------------------------------------------------------ No results found for: BNP  Inpatient Medications  Scheduled Meds: . docusate sodium  100 mg Oral BID  . enoxaparin (LOVENOX) injection  40 mg Subcutaneous Q24H  . fluticasone  2 spray Each Nare Daily  . folic acid  1 mg Intravenous Daily  . ibuprofen  200 mg Oral TID  . leflunomide  10 mg Oral Daily  . levothyroxine  75 mcg Oral QAC breakfast  . [START ON 12/21/2017] methotrexate  2.5 mg Oral Weekly  . metoprolol succinate  50 mg Oral Daily   Continuous Infusions: . sodium chloride 75 mL/hr at 12/18/17 2322  . lactated ringers Stopped (12/18/17 1522)   PRN Meds:.acetaminophen, albuterol, cyclobenzaprine, HYDROmorphone (DILAUDID) injection, ketorolac, menthol-cetylpyridinium **OR** phenol, metoCLOPramide **OR** metoCLOPramide (REGLAN) injection, ondansetron **OR** ondansetron (ZOFRAN) IV, oxyCODONE, polyethylene glycol, zolpidem  Micro Results Recent Results (from the  past 240 hour(s))  MRSA PCR Screening     Status: None   Collection Time: 12/18/17  4:41 AM  Result Value Ref Range Status   MRSA by PCR NEGATIVE NEGATIVE Final    Comment:        The GeneXpert MRSA Assay (FDA approved for NASAL specimens only), is one component of a comprehensive MRSA colonization surveillance program. It is not intended to diagnose MRSA infection nor to guide or monitor treatment for MRSA infections. Performed at [facility], [place] [address], [place], [place] [address]     Radiology Reports Ct Head Wo Contrast  Result Date: 12/17/2017 CLINICAL DATA:  67 year old female with fall and head trauma. EXAM: CT HEAD WITHOUT CONTRAST TECHNIQUE: Contiguous axial images were obtained from the base of the skull through the vertex without intravenous contrast. COMPARISON:  Brain MRI dated 07/08/2011 FINDINGS: Brain: There is mild cortical volume loss. The gray-white matter discrimination is preserved. There is no acute intracranial hemorrhage. No mass effect or midline shift noted. No extra-axial fluid collection. Vascular: No hyperdense vessel or unexpected calcification. Skull: Normal. Negative for fracture or focal lesion. Sinuses/Orbits: No acute finding. Other: Minimal left forehead contusion. IMPRESSION: No acute intracranial pathology. Electronically Signed   By: Anner Crete M.D.   On: 12/17/2017 22:41   Dg C-arm 1-60 Min  Result Date: 12/18/2017 CLINICAL DATA:  Right hip pinning. EXAM: DG C-ARM 61-120 MIN; OPERATIVE RIGHT HIP WITH PELVIS COMPARISON:  Right hip x-rays from same day. FLUOROSCOPY TIME:  1 minutes, 34 seconds. C-arm fluoroscopic images were obtained intraoperatively and submitted for post operative  interpretation. FINDINGS: Multiple intraoperative fluoroscopic images demonstrate interval pinning of the right subcapital femoral neck fracture with 3 partially cannulated screws. No evidence of hardware complication. IMPRESSION: Interval right subcapital  femoral neck fracture pinning. No acute abnormality. Electronically Signed   By: Titus Dubin M.D.   On: 12/18/2017 15:27   Dg Hip Port Unilat With Pelvis 1v Right  Result Date: 12/18/2017 CLINICAL DATA:  Right hip pinning for femoral neck fracture EXAM: DG HIP (WITH OR WITHOUT PELVIS) 1V PORT RIGHT COMPARISON:  12/18/2017 FINDINGS: Inverted triangle grouping of 3 cannulated hip lag screws observed, successfully lagging the femoral neck fracture, and without observed distal cortical capture. Near anatomic alignment. No new fracture or complicating feature observed. IMPRESSION: 1. Successful cannulated lag screw placement across the femoral neck fracture, without complicating feature. Electronically Signed   By: Van Clines M.D.   On: 12/18/2017 16:00   Dg Hip Operative Unilat W Or W/o Pelvis Right  Result Date: 12/18/2017 CLINICAL DATA:  Right hip pinning. EXAM: DG C-ARM 61-120 MIN; OPERATIVE RIGHT HIP WITH PELVIS COMPARISON:  Right hip x-rays from same day. FLUOROSCOPY TIME:  1 minutes, 34 seconds. C-arm fluoroscopic images were obtained intraoperatively and submitted for post operative interpretation. FINDINGS: Multiple intraoperative fluoroscopic images demonstrate interval pinning of the right subcapital femoral neck fracture with 3 partially cannulated screws. No evidence of hardware complication. IMPRESSION: Interval right subcapital femoral neck fracture pinning. No acute abnormality. Electronically Signed   By: Titus Dubin M.D.   On: 12/18/2017 15:27   Dg Hip Unilat With Pelvis 2-3 Views Right  Result Date: 12/18/2017 CLINICAL DATA:  Recent fall with right hip pain, initial encounter EXAM: DG HIP (WITH OR WITHOUT PELVIS) 2-3V RIGHT COMPARISON:  None. FINDINGS: Pelvic ring is intact. There is evidence of a mildly impacted right subcapital femoral neck fracture with angulation at the fracture site. No other focal abnormality is noted. IMPRESSION: Right subcapital femoral neck fracture.  Electronically Signed   By: Inez Catalina M.D.   On: 12/18/2017 08:58    Time Spent in minutes  30   Jani Gravel M.D on 12/19/2017 at 9:33 AM  Between 7am to 7pm - Pager - [phone]    After 7pm go to [email or web address] - password [facility]  [facility] -  Office  [phone]

## 2017-12-19 NOTE — Plan of Care (Signed)
  Problem: Education: Goal: Knowledge of General Education information will improve Outcome: Progressing   Problem: Activity: Goal: Risk for activity intolerance will decrease Outcome: Progressing   Problem: Pain Managment: Goal: General experience of comfort will improve Outcome: Progressing   Problem: Safety: Goal: Ability to remain free from injury will improve Outcome: Progressing

## 2017-12-20 DIAGNOSIS — S72001A Fracture of unspecified part of neck of right femur, initial encounter for closed fracture: Secondary | ICD-10-CM

## 2017-12-20 LAB — COMPREHENSIVE METABOLIC PANEL
ALT: 11 U/L (ref 0–44)
AST: 15 U/L (ref 15–41)
Albumin: 3.1 g/dL — ABNORMAL LOW (ref 3.5–5.0)
Alkaline Phosphatase: 71 U/L (ref 38–126)
Anion gap: 6 (ref 5–15)
BILIRUBIN TOTAL: 0.6 mg/dL (ref 0.3–1.2)
BUN: 14 mg/dL (ref 8–23)
CO2: 29 mmol/L (ref 22–32)
CREATININE: 0.69 mg/dL (ref 0.44–1.00)
Calcium: 8.3 mg/dL — ABNORMAL LOW (ref 8.9–10.3)
Chloride: 107 mmol/L (ref 98–111)
Glucose, Bld: 86 mg/dL (ref 70–99)
Potassium: 3.7 mmol/L (ref 3.5–5.1)
Sodium: 142 mmol/L (ref 135–145)
TOTAL PROTEIN: 5.6 g/dL — AB (ref 6.5–8.1)

## 2017-12-20 LAB — CBC
HEMATOCRIT: 33.5 % — AB (ref 36.0–46.0)
Hemoglobin: 10.7 g/dL — ABNORMAL LOW (ref 12.0–15.0)
MCH: 30.8 pg (ref 26.0–34.0)
MCHC: 31.9 g/dL (ref 30.0–36.0)
MCV: 96.5 fL (ref 78.0–100.0)
Platelets: 265 10*3/uL (ref 150–400)
RBC: 3.47 MIL/uL — ABNORMAL LOW (ref 3.87–5.11)
RDW: 14 % (ref 11.5–15.5)
WBC: 7.8 10*3/uL (ref 4.0–10.5)

## 2017-12-20 MED ORDER — CYCLOBENZAPRINE HCL 7.5 MG PO TABS: 7.5000 mg | ORAL_TABLET | Freq: Three times a day (TID) | ORAL | 0 refills | Status: AC | PRN

## 2017-12-20 MED ORDER — POLYETHYLENE GLYCOL 3350 17 G PO PACK: 17.0000 g | PACK | Freq: Every day | ORAL | 0 refills | Status: AC | PRN

## 2017-12-20 MED ORDER — IBUPROFEN 200 MG PO TABS: 200.0000 mg | ORAL_TABLET | Freq: Three times a day (TID) | ORAL | 0 refills | Status: AC

## 2017-12-20 NOTE — Progress Notes (Signed)
Patient discharging home today. Discharge instructions explained to patient and she verbalized understanding. Performed teaching on Lovenox sq injection with patient and daughter with return demonstration. No c/o pain or discomfort upon discharge. No further questions or concerns voiced.

## 2017-12-20 NOTE — Discharge Summary (Signed)
Physician Discharge Summary  Dominique Lee VZC:[id number] DOB: 02/20/1951 DOA: 12/17/2017  PCP: Elsie Lincoln, MD  Admit date: 12/17/2017 Discharge date: 12/20/2017  Time spent: *45  Recommendations for Outpatient Follow-up:  1. Follow-up in 1 to 2 weeks with orthopedics Dr. Adele Dan 2. touchdown weightbearing, remove dressing 7 days postop leave open to air with dry gauze PRN, 3.  Lovenox X 4 weeks   Discharge Diagnoses:  Principal Problem:   Fracture of femoral neck, right (HCC) Active Problems:   Fall   Hypothyroidism   COPD (chronic obstructive pulmonary disease) (HCC)   [name] (premature ventricular contraction)   RA (rheumatoid arthritis) ([place])   Discharge Condition: Improved   dIet recommendation: Heart healthy  Filed Weights   12/18/17 1229  Weight: 64.9 kg (143 lb)    History of present illness:  67 year old female with rheumatoid PVCs on metoprolol hypothyroidism fibromyalgia hyperlipidemia COPD presented with mechanical fall from [facility] rocking him on 6/30 accidentally fell hurt her head CT was negative but was found to have hip fracture on the right side and taken to surgery Postop course was uneventful-therapy did not have any specific recommendations and recommended 24/7 supervision which will be done at her mother's home Pain control will be with ibuprofen and OxyIR as needed-, she should take Lovenox for 1 month and her sister will give it to her daily  Hospital Course:  See above  Procedures:  Right hip pinning on 7/1  Consultations:  Orthopedics  Discharge Exam: Vitals:   12/19/17 2100 12/20/17 0400  BP: 114/62 138/70  Pulse: 62 60  Resp: 13 15  Temp: 98.5 F (36.9 C) 98.2 F (36.8 C)  SpO2: 97% 97%   Alert pleasant oriented no distress some constipation which is now resolved General: EOMI NCAT Cardiovascular: S1-S2 no murmur rub or gallop Respiratory: Chest clear Right hip bandage is intact with only minimal oozing neurovascular exam is  intact moving all 4 limbs equally without severe pain  Discharge Instructions    Allergies as of 12/20/2017      Reactions   Codeine Other (See Comments)   Agitation    Morphine And Related Other (See Comments)   agitation    Statins Other (See Comments)   Joint  pain      Medication List    TAKE these medications   acetaminophen 500 MG tablet Commonly known as:  TYLENOL Take 2 tablets (1,000 mg total) by mouth every 8 (eight) hours for 14 days. What changed:    how much to take  when to take this  reasons to take this   cyclobenzaprine 7.5 MG tablet Commonly known as:  FEXMID Take 1 tablet (7.5 mg total) by mouth 3 (three) times daily as needed for muscle spasms.   enoxaparin 40 MG/0.4ML injection Commonly known as:  LOVENOX Inject 0.4 mLs (40 mg total) into the skin daily.   fluticasone 50 MCG/ACT nasal spray Commonly known as:  FLONASE Place 1 spray into both nostrils daily as needed for allergies or rhinitis.   folic acid 1 MG tablet Commonly known as:  FOLVITE Take 1 mg by mouth daily at 12 noon.   gabapentin 300 MG capsule Commonly known as:  NEURONTIN Take 1-2 capsules by mouth daily as needed for pain.   ibuprofen 200 MG tablet Commonly known as:  ADVIL,MOTRIN Take 1 tablet (200 mg total) by mouth 3 (three) times daily.   leflunomide 10 MG tablet Commonly known as:  ARAVA Take 10 mg by mouth daily  at 12 noon.   levothyroxine 75 MCG tablet Commonly known as:  SYNTHROID, LEVOTHROID Take 75 mcg by mouth daily.   methotrexate 2.5 MG tablet Take 15 mg by mouth once a week. Thursdays   metoprolol succinate 50 MG 24 hr tablet Commonly known as:  TOPROL-XL Take 1-1.5 tablets (50-75 mg total) by mouth daily. What changed:    how much to take  how to take this  when to take this  additional instructions   omeprazole 20 MG capsule Commonly known as:  PRILOSEC Take 1 capsule (20 mg total) by mouth daily for 14 days.   ondansetron 4 MG  tablet Commonly known as:  ZOFRAN Take 1 tablet (4 mg total) by mouth every 8 (eight) hours as needed for up to 7 days for nausea or vomiting.   oxyCODONE 5 MG immediate release tablet Commonly known as:  Oxy IR/ROXICODONE Take 1 pills every 4 hrs as needed for pain   polyethylene glycol packet Commonly known as:  MIRALAX / GLYCOLAX Take 17 g by mouth daily as needed for mild constipation.            Durable Medical Equipment  (From admission, onward)        Start     Ordered   12/20/17 0921  For home use only DME Walker rolling  [facility])  Once    Question:  Patient needs a walker to treat with the following condition  Answer:  Hip fracture ([place])   12/20/17 0935   12/19/17 1543  For home use only DME Walker rolling  Once    Question:  Patient needs a walker to treat with the following condition  Answer:  Femur fracture, right ([place])   12/19/17 1543     Allergies  Allergen Reactions  . Codeine Other (See Comments)    Agitation   . Morphine And Related Other (See Comments)    agitation   . Statins Other (See Comments)    Joint  pain   Follow-up Information    [facility] Follow up.   Specialty:  [place] Why:  A representative from [place] will contact you to arrange start date and time for your therapy. Contact information: [address] [place] [address] [place], Dax T, MD. Schedule an appointment as soon as possible for a visit on 01/02/2018.   Specialty:  Orthopedic Surgery Contact information: [address] [place] [address] [phone]            The results of significant diagnostics from this hospitalization (including imaging, microbiology, ancillary and laboratory) are listed below for reference.    Significant Diagnostic Studies: Ct Head Wo Contrast  Result Date: 12/17/2017 CLINICAL DATA:  67 year old female with fall and head trauma. EXAM: CT HEAD  WITHOUT CONTRAST TECHNIQUE: Contiguous axial images were obtained from the base of the skull through the vertex without intravenous contrast. COMPARISON:  Brain MRI dated 07/08/2011 FINDINGS: Brain: There is mild cortical volume loss. The gray-white matter discrimination is preserved. There is no acute intracranial hemorrhage. No mass effect or midline shift noted. No extra-axial fluid collection. Vascular: No hyperdense vessel or unexpected calcification. Skull: Normal. Negative for fracture or focal lesion. Sinuses/Orbits: No acute finding. Other: Minimal left forehead contusion. IMPRESSION: No acute intracranial pathology. Electronically Signed   By: Anner Crete M.D.   On: 12/17/2017 22:41   Dg C-arm 1-60 Min  Result Date:  12/18/2017 CLINICAL DATA:  Right hip pinning. EXAM: DG C-ARM 61-120 MIN; OPERATIVE RIGHT HIP WITH PELVIS COMPARISON:  Right hip x-rays from same day. FLUOROSCOPY TIME:  1 minutes, 34 seconds. C-arm fluoroscopic images were obtained intraoperatively and submitted for post operative interpretation. FINDINGS: Multiple intraoperative fluoroscopic images demonstrate interval pinning of the right subcapital femoral neck fracture with 3 partially cannulated screws. No evidence of hardware complication. IMPRESSION: Interval right subcapital femoral neck fracture pinning. No acute abnormality. Electronically Signed   By: Titus Dubin M.D.   On: 12/18/2017 15:27   Dg Hip Port Unilat With Pelvis 1v Right  Result Date: 12/18/2017 CLINICAL DATA:  Right hip pinning for femoral neck fracture EXAM: DG HIP (WITH OR WITHOUT PELVIS) 1V PORT RIGHT COMPARISON:  12/18/2017 FINDINGS: Inverted triangle grouping of 3 cannulated hip lag screws observed, successfully lagging the femoral neck fracture, and without observed distal cortical capture. Near anatomic alignment. No new fracture or complicating feature observed. IMPRESSION: 1. Successful cannulated lag screw placement across the femoral neck  fracture, without complicating feature. Electronically Signed   By: Van Clines M.D.   On: 12/18/2017 16:00   Dg Hip Operative Unilat W Or W/o Pelvis Right  Result Date: 12/18/2017 CLINICAL DATA:  Right hip pinning. EXAM: DG C-ARM 61-120 MIN; OPERATIVE RIGHT HIP WITH PELVIS COMPARISON:  Right hip x-rays from same day. FLUOROSCOPY TIME:  1 minutes, 34 seconds. C-arm fluoroscopic images were obtained intraoperatively and submitted for post operative interpretation. FINDINGS: Multiple intraoperative fluoroscopic images demonstrate interval pinning of the right subcapital femoral neck fracture with 3 partially cannulated screws. No evidence of hardware complication. IMPRESSION: Interval right subcapital femoral neck fracture pinning. No acute abnormality. Electronically Signed   By: Titus Dubin M.D.   On: 12/18/2017 15:27   Dg Hip Unilat With Pelvis 2-3 Views Right  Result Date: 12/18/2017 CLINICAL DATA:  Recent fall with right hip pain, initial encounter EXAM: DG HIP (WITH OR WITHOUT PELVIS) 2-3V RIGHT COMPARISON:  None. FINDINGS: Pelvic ring is intact. There is evidence of a mildly impacted right subcapital femoral neck fracture with angulation at the fracture site. No other focal abnormality is noted. IMPRESSION: Right subcapital femoral neck fracture. Electronically Signed   By: Inez Catalina M.D.   On: 12/18/2017 08:58    Microbiology: Recent Results (from the past 240 hour(s))  MRSA PCR Screening     Status: None   Collection Time: 12/18/17  4:41 AM  Result Value Ref Range Status   MRSA by PCR NEGATIVE NEGATIVE Final    Comment:        The GeneXpert MRSA Assay (FDA approved for NASAL specimens only), is one component of a comprehensive MRSA colonization surveillance program. It is not intended to diagnose MRSA infection nor to guide or monitor treatment for MRSA infections. Performed at [facility], [place] [address], [place], [place] [address]      Labs: Basic Metabolic  Panel: Recent Labs  Lab 12/17/17 2324 12/19/17 0422 12/20/17 0430  NA 138 137 142  K 3.6 4.3 3.7  CL 105 107 107  CO2 24 26 29   GLUCOSE 108* 154* 86  BUN 9 10 14   CREATININE 0.67 0.66 0.69  CALCIUM 8.7* 7.9* 8.3*   Liver Function Tests: Recent Labs  Lab 12/19/17 0422 12/20/17 0430  AST 15 15  ALT 15 11  ALKPHOS 80 71  BILITOT 0.5 0.6  PROT 5.9* 5.6*  ALBUMIN 3.1* 3.1*   No results for input(s): LIPASE, AMYLASE in the last  168 hours. No results for input(s): AMMONIA in the last 168 hours. CBC: Recent Labs  Lab 12/17/17 2324 12/19/17 0422 12/20/17 0430  WBC 9.5 7.6 7.8  HGB 11.8* 11.5* 10.7*  HCT 36.2 36.0 33.5*  MCV 95.0 95.7 96.5  PLT 251 246 265   Cardiac Enzymes: No results for input(s): CKTOTAL, CKMB, CKMBINDEX, TROPONINI in the last 168 hours. BNP: BNP (last 3 results) No results for input(s): BNP in the last 8760 hours.  ProBNP (last 3 results) No results for input(s): PROBNP in the last 8760 hours.  CBG: No results for input(s): GLUCAP in the last 168 hours.     Signed:  Nita Sells MD   [facility] 12/20/2017, 9:20 AM

## 2017-12-20 NOTE — Progress Notes (Signed)
ORTHOPAEDIC PROGRESS NOTE  s/p Procedure(s): CANNULATED HIP PINNING  SUBJECTIVE: NAD, did well with PT, likely home dc  OBJECTIVE: PE: OEU:MPNTIRWE CDI, leg lengths equal, warm well perfused foot, intact EHL/TA/GSC   Vitals:   12/19/17 2100 12/20/17 0400  BP: 114/62 138/70  Pulse: 62 60  Resp: 13 15  Temp: 98.5 F (36.9 C) 98.2 F (36.8 C)  SpO2: 97% 97%     ASSESSMENT: Dominique Lee is a 67 y.o. female doing well postoperatively.  PLAN: Weightbearing: TDWB RLE Insicional and dressing care: OK to remove dressings 7 days postop and leave open to air with dry gauze PRN Orthopedic device(s): None Showering: prn VTE prophylaxis: Lovenox 40mg  qd 4 weeks Pain control: prns Follow - up plan: 2 weeks Contact information:  Weekdays 8-5 Ophelia Charter MD [phone], After hours and holidays please check [email or web address] for group call information for [facility]  OK FOR DC FROM ORTHO PERSPECTIVE WHEN MOBILIZING

## 2017-12-20 NOTE — Discharge Instructions (Signed)
TDWB, dressing can be removed POD7, replace steri strips if they come off.

## 2017-12-20 NOTE — Progress Notes (Signed)
Occupational Therapy Treatment Patient Details Name: Dominique Lee MRN: [id number] DOB: 1950/09/11 Today's Date: 12/20/2017    History of present illness 67 y.o. female with medical history significant of rheumatoid arthritis, hyperlipidemia, COPD, fibromyalgia, [name], hypothyroidism, COPD, who presents with fall, right hip pain after mechanical fall. s/p 12/18/2017 R hip pinning.    OT comments  Patient pleasant and cooperative, highly motivated. Demonstrates ability to complete UB dressing with setup, LB dressing with close supervision after education on compensatory techniques for safety (1 handed technique), toileting and toilet transfers with close supervision using RW, and simulated tub transfers with close supervision. Determined safest method for bathing with tub sliding door setup will be for patient to sit 3:1 facing out of tub, having assistance to ensure floor is dry and safe prior to exiting seat, using long handled sponge to wash lower legs (as they will be outside of tub)--as patient is unable to safely backwards step over tub if sliding doors were removed.  Patient progressing well, will continue to follow while admitted. Continue to recommend intermittent supervision during standing self care, transfers and mobility, no OT follow up required.    Follow Up Recommendations  No OT follow up;Supervision - Intermittent    Equipment Recommendations  None recommended by OT    Recommendations for Other Services      Precautions / Restrictions Precautions Precautions: Fall Restrictions Weight Bearing Restrictions: Yes RLE Weight Bearing: Touchdown weight bearing       Mobility Bed Mobility Overal bed mobility: Independent Bed Mobility: Supine to Sit     Supine to sit: Independent     General bed mobility comments: simulated home setup  Transfers Overall transfer level: Needs assistance Equipment used: Rolling walker (2 wheeled) Transfers: Sit to/from [employer] Sit to Stand: Supervision Stand pivot transfers: Supervision       General transfer comment: Good hand placement and technique. close supervision for safety. Pt able to maintain TDWB RLE thoughout    Balance Overall balance assessment: Needs assistance Sitting-balance support: No upper extremity supported;Feet unsupported Sitting balance-Leahy Scale: Good     Standing balance support: Single extremity supported;During functional activity Standing balance-Leahy Scale: Fair Standing balance comment: reliant on 1 UE support                           ADL either performed or assessed with clinical judgement   ADL Overall ADL's : Needs assistance/impaired             Lower Body Bathing: Supervison/ safety;Sitting/lateral leans Lower Body Bathing Details (indicate cue type and reason): educated on positioning, safety and techniques to increased independnece using long sponge and lateral leans Upper Body Dressing : Set up;Supervision/safety;Sitting   Lower Body Dressing: Supervision/safety;Sit to/from stand;Cueing for safety;Cueing for compensatory techniques Lower Body Dressing Details (indicate cue type and reason): edu on compensatory techniques (1 hand technique and threading R LE first) Toilet Transfer: Supervision/safety;Ambulation;BSC;RW Toilet Transfer Details (indicate cue type and reason): to 3:1 requires, close supervision for safety due to balance and TDWB R LE Toileting- Clothing Manipulation and Hygiene: Supervision/safety;Sit to/from stand Toileting - Clothing Manipulation Details (indicate cue type and reason): edu on lateral lean or 1 handed technique Tub/ Shower Transfer: Supervision/safety;Cueing for safety;Stand-pivot;3 in 1;Rolling walker;Tub transfer Tub/Shower Transfer Details (indicate cue type and reason): discussed options based on tub sliding door setup, determined 3:1 facing out of tub will be safest initally with assistance Functional  mobility during ADLs: Supervision/safety;Rolling  walker(close supervision for safety) General ADL Comments: Pt able to maintain TDWB on RLE throughout functional activities this session.     Vision       Perception     Praxis      Cognition Arousal/Alertness: Awake/alert Behavior During Therapy: WFL for tasks assessed/performed Overall Cognitive Status: Within Functional Limits for tasks assessed                                          Exercises     Shoulder Instructions       General Comments      Pertinent Vitals/ Pain       Pain Assessment: Faces Faces Pain Scale: Hurts a little bit Pain Location: R hip Pain Descriptors / Indicators: Discomfort Pain Intervention(s): Monitored during session  Home Living                                          Prior Functioning/Environment              Frequency  Min 2X/week        Progress Toward Goals  OT Goals(current goals can now be found in the care plan section)  Progress towards OT goals: Progressing toward goals  Acute Rehab OT Goals Patient Stated Goal: go home OT Goal Formulation: With patient Time For Goal Achievement: 01/02/18 Potential to Achieve Goals: Good  Plan Discharge plan remains appropriate;Frequency remains appropriate    Co-evaluation                 AM-PAC PT "6 Clicks" Daily Activity     Outcome Measure   Help from another person eating meals?: None Help from another person taking care of personal grooming?: A Little Help from another person toileting, which includes using toliet, bedpan, or urinal?: A Little Help from another person bathing (including washing, rinsing, drying)?: A Little Help from another person to put on and taking off regular upper body clothing?: None Help from another person to put on and taking off regular lower body clothing?: A Little 6 Click Score: 20    End of Session Equipment Utilized During Treatment:  Rolling walker  OT Visit Diagnosis: Unsteadiness on feet (R26.81);History of falling (Z91.81);Pain Pain - Right/Left: Right Pain - part of body: Hip   Activity Tolerance Patient tolerated treatment well   Patient Left with family/visitor present;with call bell/phone within reach(seated EOB)   Nurse Communication Mobility status        Time: 1638-4665 OT Time Calculation (min): 25 min  Charges: OT General Charges $OT Visit: 1 Visit OT Treatments $Self Care/Home Management : 23-37 mins  Delight Stare, OTR/L  Pager [place] 12/20/2017, 11:40 AM

## 2017-12-20 NOTE — Progress Notes (Addendum)
Physical Therapy Treatment Patient Details Name: JANNEY PRIEGO MRN: [id number] DOB: April 14, 1951 Today's Date: 12/20/2017    History of Present Illness 67 y.o. female with medical history significant of rheumatoid arthritis, hyperlipidemia, COPD, fibromyalgia, [name], hypothyroidism, COPD, who presents with fall, right hip pain after mechanical fall. s/p 12/18/2017 R hip pinning.     PT Comments    POD #2 patient was sitting EOB upon arrival. Patient was able to verbalize clear understanding of weight bearing restrictions (TTWB) prior to transfer and gait. Patient was able to transfer without VC's or physical assist. Patient ambulated with RW for 30 ft without violating WB restrictions and only needed min guard for safety. Patient needed two standing rest breaks during ambulation. Patient went from sitting on EOB to supine with bed flat and did not use any rails, simulating her home situation. Patient correctly demonstrated her home exercises without the need for VC's. Went over activity pacing and icing with patient and family members. Patient is safe with bed mobility, transfers, and ambulation with RW.   Follow Up Recommendations  Home health PT;Supervision for mobility/OOB     Equipment Recommendations  Rolling walker with 5" wheels    Recommendations for Other Services       Precautions / Restrictions Precautions Precautions: Fall Restrictions Weight Bearing Restrictions: Yes RLE Weight Bearing: Touchdown weight bearing    Mobility  Bed Mobility Overal bed mobility: Independent Bed Mobility: Supine to Sit     Supine to sit: Independent     General bed mobility comments: simulated home setup  Transfers Overall transfer level: Needs assistance Equipment used: Rolling walker (2 wheeled) Transfers: Sit to/from Stand Sit to Stand: Supervision Stand pivot transfers: Supervision       General transfer comment: competed transfers without cueing or  assist  Ambulation/Gait Ambulation/Gait assistance: Min guard Gait Distance (Feet): 30 Feet Assistive device: Rolling walker (2 wheeled) Gait Pattern/deviations: Step-to pattern;Antalgic;Decreased step length - left;Trunk flexed Gait velocity: slowed   General Gait Details: anxious about touching toe to floor for balance; prefers to keep R LE off the floor; needed two short standing rest breaks; only min guard needed for safety   Stairs             Wheelchair Mobility    Modified Rankin (Stroke Patients Only)       Balance Overall balance assessment: Needs assistance Sitting-balance support: No upper extremity supported;Feet unsupported Sitting balance-Leahy Scale: Good     Standing balance support: Single extremity supported;During functional activity Standing balance-Leahy Scale: Fair Standing balance comment: reliant on 1 UE support                            Cognition Arousal/Alertness: Awake/alert Behavior During Therapy: WFL for tasks assessed/performed Overall Cognitive Status: Within Functional Limits for tasks assessed                                        Exercises Total Joint Exercises Ankle Circles/Pumps: AROM;Both;Supine;5 reps Quad Sets: AROM;Right;Supine;5 reps Short Arc Quad: AROM;Right;Supine;5 reps Heel Slides: AROM;Right;Supine;5 reps Hip ABduction/ADduction: AROM;Right;Supine;5 reps Long Arc Quad: AROM;Right;5 reps;Seated    General Comments        Pertinent Vitals/Pain Pain Assessment: 0-10 Pain Score: 2  Faces Pain Scale: Hurts a little bit Pain Location: R hip Pain Descriptors / Indicators: Discomfort Pain Intervention(s): Limited activity within patient's tolerance;Repositioned;Ice  applied;Monitored during session    Home Living                      Prior Function            PT Goals (current goals can now be found in the care plan section) Acute Rehab PT Goals Patient Stated Goal:  go home Progress towards PT goals: Progressing toward goals    Frequency    7X/week      PT Plan Current plan remains appropriate    Co-evaluation              AM-PAC PT "6 Clicks" Daily Activity  Outcome Measure  Difficulty turning over in bed (including adjusting bedclothes, sheets and blankets)?: A Little Difficulty moving from lying on back to sitting on the side of the bed? : A Little Difficulty sitting down on and standing up from a chair with arms (e.g., wheelchair, bedside commode, etc,.)?: Unable Help needed moving to and from a bed to chair (including a wheelchair)?: A Little Help needed walking in hospital room?: A Little Help needed climbing 3-5 steps with a railing? : Total 6 Click Score: 14    End of Session Equipment Utilized During Treatment: Gait belt Activity Tolerance: Patient tolerated treatment well Patient left: in bed;with call bell/phone within reach;with family/visitor present   PT Visit Diagnosis: Unsteadiness on feet (R26.81);Other abnormalities of gait and mobility (R26.89);History of falling (Z91.81);Muscle weakness (generalized) (M62.81);Difficulty in walking, not elsewhere classified (R26.2);Pain Pain - Right/Left: Right Pain - part of body: Hip     Time: 0131-4388 PT Time Calculation (min) (ACUTE ONLY): 23 min  Charges:  $Gait Training: 8-22 mins $Therapeutic Exercise: 8-22 mins                    G Codes:       Rachel Bo, SPTA 12/20/2017, 12:38 PM  Direct Supervision given and agree with above  Rica Koyanagi  PTA [facility]  Acute  Rehab Pager      [phone]

## 2017-12-20 NOTE — Plan of Care (Signed)
  Problem: Education: Goal: Knowledge of General Education information will improve Outcome: Progressing   Problem: Activity: Goal: Risk for activity intolerance will decrease Outcome: Progressing   Problem: Elimination: Goal: Will not experience complications related to bowel motility Outcome: Progressing   Problem: Pain Managment: Goal: General experience of comfort will improve Outcome: Progressing   Problem: Safety: Goal: Ability to remain free from injury will improve Outcome: Progressing   

## 2018-05-28 NOTE — Progress Notes (Addendum)
[facility] Note  05/30/2018     CHIEF COMPLAINT Patient presents for Retina Evaluation   HISTORY OF PRESENT ILLNESS: Dominique Lee is a 67 y.o. female who presents to the clinic today for:   HPI    Retina Evaluation    In both eyes.  This started 2 months ago.  Associated Symptoms Distortion, Floaters and Blind Spot.  Negative for Glare, Shoulder/Hip pain, Flashes, Pain, Trauma, Fever, Weight Loss, Fatigue, Jaw Claudication, Photophobia, Redness and Scalp Tenderness.  Context:  distance vision, mid-range vision and near vision.  Treatments tried include artificial tears.  I, the attending physician,  performed the HPI with the patient and updated documentation appropriately.          Comments    Referral of Dr. Jorja Loa for retina eval. Patient states appx 2 months ago, she noticed her "vision had changed, things appeared smaller, words appered smaller, middle of words was missing and  figures was distorted". Denies flashes and ocular pain but has occasional floaters per patient. Pt reports appx 2 yrs ago she had "something like tunnel vision, lasted for appx one hour and gradually  went away. Pt is using Systane complete, denies Vit's.        Last edited by Bernarda Caffey, MD on 05/30/2018  8:50 AM. (History)    pt states she saw Dr. Jorja Loa last week, she states when she made the original appointment -- back in Sept/Oct -- it was just for a routine eye exam, but as time went on she started to notice that parts of words were missing, she states the earliest appointment she could get was early December, she states she tried to be seen earlier, but was told they had no earlier appointments, pt states her central vision is still missing, pt states she has RA, hypothyroidism and [name]'s, pt states she was on prednisone for about 3 years, but has been off of it for about a year, she states she had sx to remove part of her intestines last year that was  pre-cancerous  Referring physician: Madelin Headings, DO [address] [place], [place] [address]  HISTORICAL INFORMATION:   Selected notes from the MEDICAL RECORD NUMBER 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 Cotter) [BCVA: OD: 20/40- OS: 20/100-] Ocular Hx-central serous chorioretinopathy, DES PMH-arrthymia, HLD, Rheumatoid Arthritis, Thyroid disease    CURRENT MEDICATIONS: No current outpatient medications on file. (Ophthalmic Drugs)   No current facility-administered medications for this visit.  (Ophthalmic Drugs)   Current Outpatient Medications (Other)  Medication Sig  . cyclobenzaprine (FEXMID) 7.5 MG tablet Take 1 tablet (7.5 mg total) by mouth 3 (three) times daily as needed for muscle spasms.  Marland Kitchen enoxaparin (LOVENOX) 40 MG/0.4ML injection Inject 0.4 mLs (40 mg total) into the skin daily.  . fluticasone (FLONASE) 50 MCG/ACT nasal spray Place 1 spray into both nostrils daily as needed for allergies or rhinitis.  . folic acid (FOLVITE) 1 MG tablet Take 1 mg by mouth daily at 12 noon.  . gabapentin (NEURONTIN) 300 MG capsule Take 1-2 capsules by mouth daily as needed for pain.  Marland Kitchen ibuprofen (ADVIL,MOTRIN) 200 MG tablet Take 1 tablet (200 mg total) by mouth 3 (three) times daily.  Marland Kitchen leflunomide (ARAVA) 10 MG tablet Take 10 mg by mouth daily at 12 noon.  Marland Kitchen levothyroxine (SYNTHROID, LEVOTHROID) 75 MCG tablet Take 75 mcg by mouth daily.  . methotrexate 2.5 MG tablet Take 15 mg  by mouth once a week. Thursdays  . metoprolol succinate (TOPROL-XL) 50 MG 24 hr tablet Take 1-1.5 tablets (50-75 mg total) by mouth daily. (Patient taking differently: Take 50 mg by mouth at bedtime. )  . polyethylene glycol (MIRALAX / GLYCOLAX) packet Take 17 g by mouth daily as needed for mild constipation.  Marland Kitchen omeprazole (PRILOSEC) 20 MG capsule Take 1 capsule (20 mg total) by mouth daily for 14 days.   No current facility-administered medications for this  visit.  (Other)      REVIEW OF SYSTEMS: ROS    Positive for: Eyes   Negative for: Constitutional, Gastrointestinal, Neurological, Skin, Genitourinary, Musculoskeletal, HENT, Endocrine, Cardiovascular, Respiratory, Psychiatric, Allergic/Imm, Heme/Lymph   Last edited by Zenovia Jordan, LPN on 37/90/2409  7:35 AM. (History)       ALLERGIES Allergies  Allergen Reactions  . Codeine Other (See Comments)    Agitation   . Morphine And Related Other (See Comments)    agitation   . Statins Other (See Comments)    Joint  pain    PAST MEDICAL HISTORY Past Medical History:  Diagnosis Date  . COPD (chronic obstructive pulmonary disease) ([place])   . Fibromyalgia   . High cholesterol   . Hypothyroidism   . Irregular heart beat   . Osteoarthritis   . Pneumothorax after biopsy   . [name] (premature ventricular contraction)   . RA (rheumatoid arthritis) ([place])   . Thyroid disease    Hypothyroid   Past Surgical History:  Procedure Laterality Date  . ABDOMINAL HYSTERECTOMY  2000  . BREAST SURGERY    . CARDIOVASCULAR STRESS TEST  09/02/2005   Cardiolite Myocardial perfusion study; NegativeBruce protocol exercise stress test  . HIP PINNING,CANNULATED Right 12/18/2017   Procedure: CANNULATED HIP PINNING;  Surgeon: Hiram Gash, MD;  Location: [facility];  Service: Orthopedics;  Laterality: Right;  . LAPAROSCOPIC PARTIAL COLECTOMY Left 05/18/2017  . NASAL SINUS SURGERY    . TUBAL LIGATION    . US ECHOCARDIOGRAPHY  12/26/2011   mild abnormalities    FAMILY HISTORY Family History  Problem Relation Age of Onset  . Hypertension Mother   . Cancer - Lung Father 73       deceased  . Heart attack Maternal Grandmother   . Cancer Maternal Grandfather   . Diabetes Paternal Grandfather   . Diabetes Brother   . Cancer - Colon Sister     SOCIAL HISTORY Social History   Tobacco Use  . Smoking status: Former Smoker    Types: Cigarettes    Last attempt to quit: 11/04/2002    Years since quitting:  15.5  . Smokeless tobacco: Never Used  Substance Use Topics  . Alcohol use: No  . Drug use: No         OPHTHALMIC EXAM:  Base Eye Exam    Visual Acuity (Snellen - Linear)      Right Left   Dist cc 20/40 20/100 -1   Dist ph cc 20/40 +2 NI   Correction:  Glasses       Tonometry (Tonopen, 8:25 AM)      Right Left   Pressure 16 20       Pupils      Dark Light Shape React APD   Right 3 2 Round Brisk None   Left 3 2 Round Brisk None       Visual Fields (Counting fingers)      Left Right     Full   Restrictions Partial outer  superior temporal, inferior temporal, superior nasal, inferior nasal deficiencies        Extraocular Movement      Right Left    Full, Ortho Full, Ortho       Neuro/Psych    Oriented x3:  Yes       Dilation    Both eyes:  1.0% Mydriacyl, 2.5% Phenylephrine @ 8:25 AM        Slit Lamp and Fundus Exam    Slit Lamp Exam      Right Left   Lids/Lashes Dermatochalasis - upper lid Dermatochalasis - upper lid   Conjunctiva/Sclera White and quiet mild nasal and temporal Pinguecula   Cornea 1-2+ Punctate epithelial erosions 1-2+ Punctate epithelial erosions, Arcus   Anterior Chamber Deep and clear Deep and clear   Iris Round and moderately dilated Round and moderately dilated to 85mm   Lens 2+ Nuclear sclerosis, 2+ Cortical cataract 2+ Nuclear sclerosis, 2+ Cortical cataract   Vitreous Vitreous syneresis, Posterior vitreous detachment Vitreous syneresis, Posterior vitreous detachment       Fundus Exam      Right Left   Disc Pink and Sharp Pink and Sharp   C/D Ratio 0.5 0.5   Macula Flat, Blunted foveal reflex, RPE mottling and clumping, No heme or edema Macular hole with surrounding cystic changes, no heme   Vessels mild Copper wiring, Vascular attenuation, mild AV crossing changes mild Copper wiring, Vascular attenuation, mild AV crossing changes   Periphery Attached, No RT/RD Attached, No RT/RD          IMAGING AND PROCEDURES  Imaging  and Procedures for @TODAY @  OCT, Retina - OU - Both Eyes       Right Eye Quality was good. Central Foveal Thickness: 253. Progression has no prior data. Findings include normal foveal contour, no SRF, no IRF.   Left Eye Quality was good. Central Foveal Thickness: 354. Progression has no prior data. Findings include macular hole, abnormal foveal contour, intraretinal fluid, subretinal fluid.   Notes *Images captured and stored on drive  Diagnosis / Impression:  OD: NFP, No IRF/SRF OS: Full thickness macular hole   Clinical management:  See below  Abbreviations: NFP - Normal foveal profile. CME - cystoid macular edema. PED - pigment epithelial detachment. IRF - intraretinal fluid. SRF - subretinal fluid. EZ - ellipsoid zone. ERM - epiretinal membrane. ORA - outer retinal atrophy. ORT - outer retinal tubulation. SRHM - subretinal hyper-reflective material                 ASSESSMENT/PLAN:    ICD-10-CM   1. Macular hole of left eye H35.342   2. Retinal edema H35.81 OCT, Retina - OU - Both Eyes  3. Essential hypertension I10   4. Hypertensive retinopathy of both eyes H35.033   5. Combined forms of age-related cataract of both eyes H25.813   6. Dry eyes H04.123     1,2. Full Thickness Macular Hole OS - BCVA 20/100 w/ central scotoma OS - The natural history, anatomy, potential for loss of vision, and treatment options including vitrectomy techniques and the complications of endophthalmitis, retinal detachment, vitreous hemorrhage, cataract progression and permanent vision loss discussed with the patient. - recommend PPV w/ ICG/Membrane peel/gas OS under general anesthesia - RBA of procedure discussed, questions answered - informed consent obtained and signed - surgery scheduled for June 21, 2018 - will need medical clearance from PCP - f/u POV1 06/22/18  3,4. Hypertensive retinopathy OU - discussed importance of tight BP control -  monitor  5. Mixed for age related  cataracts  - The symptoms of cataract, surgical options, and treatments and risks were discussed with patient.  - discussed diagnosis and progression and likelihood of cataract progression OS with PPV  - not yet visually significant  - monitor for now  6. Dry eyes OU - recommend artificial tears and lubricating ointment as needed   Ophthalmic Meds Ordered this visit:  No orders of the defined types were placed in this encounter.      Return in about 23 days (around 06/22/2018) for POV.  There are no Patient Instructions on file for this visit.   Explained the diagnoses, plan, and follow up with the patient and they expressed understanding.  Patient expressed understanding of the importance of proper follow up care.   This document serves as a record of services personally performed by Gardiner Sleeper, MD, PhD. It was created on their behalf by Ernest Mallick, OA, an ophthalmic assistant. The creation of this record is the provider's dictation and/or activities during the visit.    Electronically signed by: Ernest Mallick, OA  12.09.19 11:02 PM    Gardiner Sleeper, M.D., Ph.D. Diseases & Surgery of the Retina and Vitreous [facility]  I have reviewed the above documentation for accuracy and completeness, and I agree with the above. Gardiner Sleeper, M.D., Ph.D. 06/02/18 11:02 PM     Abbreviations: M myopia (nearsighted); A astigmatism; H hyperopia (farsighted); P presbyopia; Mrx spectacle prescription;  CTL contact lenses; OD right eye; OS left eye; OU both eyes  XT exotropia; ET esotropia; PEK punctate epithelial keratitis; PEE punctate epithelial erosions; DES dry eye syndrome; MGD meibomian gland dysfunction; ATs artificial tears; PFAT's preservative free artificial tears; [place] nuclear sclerotic cataract; PSC posterior subcapsular cataract; ERM epi-retinal membrane; PVD posterior vitreous detachment; RD retinal detachment; DM diabetes mellitus; DR diabetic  retinopathy; NPDR non-proliferative diabetic retinopathy; PDR proliferative diabetic retinopathy; CSME clinically significant macular edema; DME diabetic macular edema; dbh dot blot hemorrhages; CWS cotton wool spot; POAG primary open angle glaucoma; C/D cup-to-disc ratio; HVF humphrey visual field; GVF goldmann visual field; OCT optical coherence tomography; IOP intraocular pressure; BRVO Branch retinal vein occlusion; CRVO central retinal vein occlusion; CRAO central retinal artery occlusion; BRAO branch retinal artery occlusion; RT retinal tear; SB scleral buckle; PPV pars plana vitrectomy; VH Vitreous hemorrhage; PRP panretinal laser photocoagulation; IVK intravitreal kenalog; VMT vitreomacular traction; MH Macular hole;  NVD neovascularization of the disc; NVE neovascularization elsewhere; AREDS age related eye disease study; ARMD age related macular degeneration; POAG primary open angle glaucoma; EBMD epithelial/anterior basement membrane dystrophy; ACIOL anterior chamber intraocular lens; IOL intraocular lens; PCIOL posterior chamber intraocular lens; Phaco/IOL phacoemulsification with intraocular lens placement; [place] photorefractive keratectomy; LASIK laser assisted in situ keratomileusis; HTN hypertension; DM diabetes mellitus; COPD chronic obstructive pulmonary disease

## 2018-05-30 ENCOUNTER — Encounter (INDEPENDENT_AMBULATORY_CARE_PROVIDER_SITE_OTHER): Payer: Self-pay | Admitting: Ophthalmology

## 2018-05-30 ENCOUNTER — Ambulatory Visit (INDEPENDENT_AMBULATORY_CARE_PROVIDER_SITE_OTHER): Payer: Medicare Other | Admitting: Ophthalmology

## 2018-05-30 DIAGNOSIS — H04123 Dry eye syndrome of bilateral lacrimal glands: Secondary | ICD-10-CM

## 2018-05-30 DIAGNOSIS — H3581 Retinal edema: Secondary | ICD-10-CM

## 2018-05-30 DIAGNOSIS — I10 Essential (primary) hypertension: Secondary | ICD-10-CM | POA: Diagnosis not present

## 2018-05-30 DIAGNOSIS — H35033 Hypertensive retinopathy, bilateral: Secondary | ICD-10-CM | POA: Diagnosis not present

## 2018-05-30 DIAGNOSIS — H35342 Macular cyst, hole, or pseudohole, left eye: Secondary | ICD-10-CM

## 2018-05-30 DIAGNOSIS — H25813 Combined forms of age-related cataract, bilateral: Secondary | ICD-10-CM

## 2018-06-01 ENCOUNTER — Telehealth (INDEPENDENT_AMBULATORY_CARE_PROVIDER_SITE_OTHER): Payer: Self-pay

## 2018-06-02 ENCOUNTER — Encounter (INDEPENDENT_AMBULATORY_CARE_PROVIDER_SITE_OTHER): Payer: Self-pay | Admitting: Ophthalmology

## 2018-06-17 NOTE — H&P (Signed)
Dominique Lee is an 67 y.o. female.    Chief Complaint: decreased vision OS  HPI: Pt with PMH significant for COPD, fibromyalgia, [name], RA on methotrexate, and thyroid disease, who presents with decreased vision OS x1-2 mos. On dilated eye exam, noted to have a full thickness macular hole OS.  Past Medical History:  Diagnosis Date  . COPD (chronic obstructive pulmonary disease) ([place])   . Fibromyalgia   . High cholesterol   . Hypothyroidism   . Irregular heart beat   . Osteoarthritis   . Pneumothorax after biopsy   . [name] (premature ventricular contraction)   . RA (rheumatoid arthritis) ([place])   . Thyroid disease    Hypothyroid    Past Surgical History:  Procedure Laterality Date  . ABDOMINAL HYSTERECTOMY  2000  . BREAST SURGERY    . CARDIOVASCULAR STRESS TEST  09/02/2005   Cardiolite Myocardial perfusion study; NegativeBruce protocol exercise stress test  . HIP PINNING,CANNULATED Right 12/18/2017   Procedure: CANNULATED HIP PINNING;  Surgeon: Hiram Gash, MD;  Location: [facility];  Service: Orthopedics;  Laterality: Right;  . LAPAROSCOPIC PARTIAL COLECTOMY Left 05/18/2017  . NASAL SINUS SURGERY    . TUBAL LIGATION    . US ECHOCARDIOGRAPHY  12/26/2011   mild abnormalities    Family History  Problem Relation Age of Onset  . Hypertension Mother   . Cancer - Lung Father 66       deceased  . Heart attack Maternal Grandmother   . Cancer Maternal Grandfather   . Diabetes Paternal Grandfather   . Diabetes Brother   . Cancer - Colon Sister    Social History:  reports that she quit smoking about 15 years ago. Her smoking use included cigarettes. She has never used smokeless tobacco. She reports that she does not drink alcohol or use drugs.  Allergies:  Allergies  Allergen Reactions  . Codeine Other (See Comments)    Agitation   . Morphine And Related Other (See Comments)    agitation   . Statins Other (See Comments)    Joint  pain    No medications prior to admission.     Review of systems otherwise negative  There were no vitals taken for this visit.  Physical exam: Mental status: oriented x3. Eyes: See eye exam associated with this date of surgery Ears, Nose, Throat: within normal limits Neck: Within Normal limits General: within normal limits Chest: Within normal limits Breast: deferred Heart: Within normal limits Abdomen: Within normal limits GU: deferred Extremities: within normal limits Skin: within normal limits  Assessment/Plan 1. Full thickness macular hole, left eye 2. Combined form cataract, left eye  Plan: To [facility] for macular hole repair via 25g PPV w/ ICG, membrane peel and gas, left eye, under general anesthesia - case scheduled for 06/21/18, 1130 am in [facility] OR 08  Gardiner Sleeper, M.D., Ph.D. Vitreoretinal Surgeon [facility]

## 2018-06-18 ENCOUNTER — Other Ambulatory Visit: Payer: Self-pay

## 2018-06-18 ENCOUNTER — Encounter (HOSPITAL_COMMUNITY): Payer: Self-pay | Admitting: *Deleted

## 2018-06-18 NOTE — Progress Notes (Addendum)
Dominique Lee denies chest pain or shortness of breath.  PCP is Marliss Coots  at [facility] by the [place].  Dominique Fiallos has been a patient of Dr Claiborne Billings , [facility] in the past- she had PVCs , since being on Metoprolol , she has not had any issue.

## 2018-06-21 ENCOUNTER — Ambulatory Visit (HOSPITAL_COMMUNITY)
Admission: RE | Admit: 2018-06-21 | Discharge: 2018-06-21 | Disposition: A | Discharge status: Home / Self Care | Payer: Medicare Other | Attending: Ophthalmology | Admitting: Ophthalmology

## 2018-06-21 ENCOUNTER — Encounter (HOSPITAL_COMMUNITY): Payer: Self-pay | Admitting: *Deleted

## 2018-06-21 ENCOUNTER — Ambulatory Visit (HOSPITAL_COMMUNITY): Payer: Medicare Other | Admitting: Registered Nurse

## 2018-06-21 ENCOUNTER — Encounter (HOSPITAL_COMMUNITY)
Admission: RE | Disposition: A | Discharge status: Home / Self Care | Payer: Self-pay | Source: Home / Self Care | Attending: Ophthalmology

## 2018-06-21 ENCOUNTER — Other Ambulatory Visit: Payer: Self-pay

## 2018-06-21 DIAGNOSIS — M797 Fibromyalgia: Secondary | ICD-10-CM | POA: Insufficient documentation

## 2018-06-21 DIAGNOSIS — I493 Ventricular premature depolarization: Secondary | ICD-10-CM | POA: Insufficient documentation

## 2018-06-21 DIAGNOSIS — E7800 Pure hypercholesterolemia, unspecified: Secondary | ICD-10-CM | POA: Diagnosis not present

## 2018-06-21 DIAGNOSIS — Z90710 Acquired absence of both cervix and uterus: Secondary | ICD-10-CM | POA: Diagnosis not present

## 2018-06-21 DIAGNOSIS — Z79899 Other long term (current) drug therapy: Secondary | ICD-10-CM | POA: Diagnosis not present

## 2018-06-21 DIAGNOSIS — Z885 Allergy status to narcotic agent status: Secondary | ICD-10-CM | POA: Diagnosis not present

## 2018-06-21 DIAGNOSIS — H33002 Unspecified retinal detachment with retinal break, left eye: Secondary | ICD-10-CM | POA: Diagnosis not present

## 2018-06-21 DIAGNOSIS — H35342 Macular cyst, hole, or pseudohole, left eye: Secondary | ICD-10-CM | POA: Insufficient documentation

## 2018-06-21 DIAGNOSIS — Z87891 Personal history of nicotine dependence: Secondary | ICD-10-CM | POA: Diagnosis not present

## 2018-06-21 DIAGNOSIS — E039 Hypothyroidism, unspecified: Secondary | ICD-10-CM | POA: Diagnosis not present

## 2018-06-21 DIAGNOSIS — M069 Rheumatoid arthritis, unspecified: Secondary | ICD-10-CM | POA: Diagnosis not present

## 2018-06-21 DIAGNOSIS — E119 Type 2 diabetes mellitus without complications: Secondary | ICD-10-CM | POA: Insufficient documentation

## 2018-06-21 DIAGNOSIS — J449 Chronic obstructive pulmonary disease, unspecified: Secondary | ICD-10-CM | POA: Diagnosis not present

## 2018-06-21 HISTORY — PX: GAS/FLUID EXCHANGE: SHX5334

## 2018-06-21 HISTORY — DX: Cardiac arrhythmia, unspecified: I49.9

## 2018-06-21 HISTORY — DX: Other specified postprocedural states: Z98.890

## 2018-06-21 HISTORY — DX: Malignant (primary) neoplasm, unspecified: C80.1

## 2018-06-21 HISTORY — DX: Adverse effect of unspecified anesthetic, initial encounter: T41.45XA

## 2018-06-21 HISTORY — DX: Other specified postprocedural states: R11.2

## 2018-06-21 HISTORY — DX: Family history of other specified conditions: Z84.89

## 2018-06-21 HISTORY — DX: Other complications of anesthesia, initial encounter: T88.59XA

## 2018-06-21 HISTORY — PX: 25 GAUGE PARS PLANA VITRECTOMY WITH 20 GAUGE MVR PORT FOR MACULAR HOLE: SHX6096

## 2018-06-21 HISTORY — PX: MEMBRANE PEEL: SHX5967

## 2018-06-21 HISTORY — PX: PHOTOCOAGULATION WITH LASER: SHX6027

## 2018-06-21 LAB — CBC
HCT: 42 % (ref 36.0–46.0)
Hemoglobin: 13.5 g/dL (ref 12.0–15.0)
MCH: 31 pg (ref 26.0–34.0)
MCHC: 32.1 g/dL (ref 30.0–36.0)
MCV: 96.6 fL (ref 80.0–100.0)
PLATELETS: 308 10*3/uL (ref 150–400)
RBC: 4.35 MIL/uL (ref 3.87–5.11)
RDW: 13.9 % (ref 11.5–15.5)
WBC: 6.6 10*3/uL (ref 4.0–10.5)
nRBC: 0 % (ref 0.0–0.2)

## 2018-06-21 SURGERY — 25 GAUGE PARS PLANA VITRECTOMY WITH 20 GAUGE MVR PORT FOR MACULAR HOLE
Anesthesia: General | Site: Eye | Laterality: Left

## 2018-06-21 MED ORDER — PREDNISOLONE ACETATE 1 % OP SUSP
OPHTHALMIC | Status: DC | PRN
  Administered 2018-06-21: 1 [drp] via OPHTHALMIC

## 2018-06-21 MED ORDER — PHENYLEPHRINE 40 MCG/ML (10ML) SYRINGE FOR IV PUSH (FOR BLOOD PRESSURE SUPPORT)
PREFILLED_SYRINGE | INTRAVENOUS | Status: DC | PRN
  Administered 2018-06-21: 80 ug via INTRAVENOUS
  Administered 2018-06-21: 120 ug via INTRAVENOUS
  Administered 2018-06-21: 80 ug via INTRAVENOUS

## 2018-06-21 MED ORDER — FENTANYL CITRATE (PF) 250 MCG/5ML IJ SOLN
INTRAMUSCULAR | Status: AC
  Filled 2018-06-21: qty 5

## 2018-06-21 MED ORDER — SODIUM CHLORIDE 0.9 % IV SOLN
INTRAVENOUS | Status: DC
  Administered 2018-06-21 (×2): via INTRAVENOUS

## 2018-06-21 MED ORDER — PROPOFOL 10 MG/ML IV BOLUS
INTRAVENOUS | Status: DC | PRN
  Administered 2018-06-21: 100 mg via INTRAVENOUS

## 2018-06-21 MED ORDER — CEFTAZIDIME 1 G IJ SOLR
INTRAMUSCULAR | Status: AC
  Filled 2018-06-21: qty 1

## 2018-06-21 MED ORDER — LIDOCAINE HCL (PF) 2 % IJ SOLN
INTRAMUSCULAR | Status: AC
  Filled 2018-06-21: qty 10

## 2018-06-21 MED ORDER — PROPARACAINE HCL 0.5 % OP SOLN
1.0000 [drp] | OPHTHALMIC | Status: AC | PRN
  Administered 2018-06-21 (×3): 1 [drp] via OPHTHALMIC
  Filled 2018-06-21: qty 15

## 2018-06-21 MED ORDER — ROCURONIUM BROMIDE 50 MG/5ML IV SOSY
PREFILLED_SYRINGE | INTRAVENOUS | Status: AC
  Filled 2018-06-21: qty 5

## 2018-06-21 MED ORDER — TRIAMCINOLONE ACETONIDE 40 MG/ML IJ SUSP
INTRAMUSCULAR | Status: DC | PRN
  Administered 2018-06-21: 40 mg

## 2018-06-21 MED ORDER — EPINEPHRINE PF 1 MG/ML IJ SOLN
INTRAMUSCULAR | Status: AC
  Filled 2018-06-21: qty 1

## 2018-06-21 MED ORDER — TRIAMCINOLONE ACETONIDE 40 MG/ML IJ SUSP
INTRAMUSCULAR | Status: AC
  Filled 2018-06-21: qty 5

## 2018-06-21 MED ORDER — BRIMONIDINE TARTRATE 0.2 % OP SOLN
OPHTHALMIC | Status: AC
  Filled 2018-06-21: qty 5

## 2018-06-21 MED ORDER — MEPERIDINE HCL 50 MG/ML IJ SOLN: 6.2500 mg | INTRAMUSCULAR | Status: DC | PRN

## 2018-06-21 MED ORDER — PHENYLEPHRINE 40 MCG/ML (10ML) SYRINGE FOR IV PUSH (FOR BLOOD PRESSURE SUPPORT)
PREFILLED_SYRINGE | INTRAVENOUS | Status: AC
  Filled 2018-06-21: qty 10

## 2018-06-21 MED ORDER — ATROPINE SULFATE 1 % OP SOLN
OPHTHALMIC | Status: AC
  Filled 2018-06-21: qty 5

## 2018-06-21 MED ORDER — EPINEPHRINE PF 1 MG/ML IJ SOLN
INTRAOCULAR | Status: DC | PRN
  Administered 2018-06-21: 500 mL

## 2018-06-21 MED ORDER — PREDNISOLONE ACETATE 1 % OP SUSP
OPHTHALMIC | Status: AC
  Filled 2018-06-21: qty 5

## 2018-06-21 MED ORDER — LIDOCAINE 2% (20 MG/ML) 5 ML SYRINGE
INTRAMUSCULAR | Status: DC | PRN
  Administered 2018-06-21: 100 mg via INTRAVENOUS

## 2018-06-21 MED ORDER — NA CHONDROIT SULF-NA HYALURON 40-30 MG/ML IO SOLN
INTRAOCULAR | Status: DC | PRN
  Administered 2018-06-21: 0.5 mL via INTRAOCULAR

## 2018-06-21 MED ORDER — BACITRACIN-POLYMYXIN B 500-10000 UNIT/GM OP OINT
TOPICAL_OINTMENT | OPHTHALMIC | Status: AC
  Filled 2018-06-21: qty 3.5

## 2018-06-21 MED ORDER — PROPOFOL 10 MG/ML IV BOLUS
INTRAVENOUS | Status: AC
  Filled 2018-06-21: qty 20

## 2018-06-21 MED ORDER — CARBACHOL 0.01 % IO SOLN
INTRAOCULAR | Status: AC
  Filled 2018-06-21: qty 1.5

## 2018-06-21 MED ORDER — ONDANSETRON HCL 4 MG/2ML IJ SOLN
INTRAMUSCULAR | Status: AC
  Filled 2018-06-21: qty 2

## 2018-06-21 MED ORDER — BSS PLUS IO SOLN
INTRAOCULAR | Status: AC
  Filled 2018-06-21: qty 500

## 2018-06-21 MED ORDER — ONDANSETRON HCL 4 MG/2ML IJ SOLN
INTRAMUSCULAR | Status: DC | PRN
  Administered 2018-06-21: 4 mg via INTRAVENOUS

## 2018-06-21 MED ORDER — HYDROMORPHONE HCL 1 MG/ML IJ SOLN: 0.2500 mg | INTRAMUSCULAR | Status: DC | PRN

## 2018-06-21 MED ORDER — ATROPINE SULFATE 1 % OP SOLN
1.0000 [drp] | OPHTHALMIC | Status: AC | PRN
  Administered 2018-06-21 (×3): 1 [drp] via OPHTHALMIC
  Filled 2018-06-21: qty 2

## 2018-06-21 MED ORDER — STERILE WATER FOR INJECTION IJ SOLN
INTRAMUSCULAR | Status: DC | PRN
  Administered 2018-06-21: 20 mL

## 2018-06-21 MED ORDER — PHENYLEPHRINE HCL 10 % OP SOLN
1.0000 [drp] | OPHTHALMIC | Status: AC | PRN
  Administered 2018-06-21 (×3): 1 [drp] via OPHTHALMIC
  Filled 2018-06-21: qty 5

## 2018-06-21 MED ORDER — BUPIVACAINE HCL (PF) 0.75 % IJ SOLN
INTRAMUSCULAR | Status: AC
  Filled 2018-06-21: qty 10

## 2018-06-21 MED ORDER — BSS IO SOLN
INTRAOCULAR | Status: AC
  Filled 2018-06-21: qty 15

## 2018-06-21 MED ORDER — ONDANSETRON HCL 4 MG/2ML IJ SOLN: 4.0000 mg | Freq: Once | INTRAMUSCULAR | Status: DC | PRN

## 2018-06-21 MED ORDER — DORZOLAMIDE HCL-TIMOLOL MAL 2-0.5 % OP SOLN
OPHTHALMIC | Status: DC | PRN
  Administered 2018-06-21: 1 [drp] via OPHTHALMIC

## 2018-06-21 MED ORDER — LIDOCAINE 2% (20 MG/ML) 5 ML SYRINGE
INTRAMUSCULAR | Status: AC
  Filled 2018-06-21: qty 5

## 2018-06-21 MED ORDER — NA CHONDROIT SULF-NA HYALURON 40-30 MG/ML IO SOLN
INTRAOCULAR | Status: AC
  Filled 2018-06-21: qty 1

## 2018-06-21 MED ORDER — DEXAMETHASONE SODIUM PHOSPHATE 10 MG/ML IJ SOLN
INTRAMUSCULAR | Status: DC | PRN
  Administered 2018-06-21: 10 mg via INTRAVENOUS

## 2018-06-21 MED ORDER — STERILE WATER FOR INJECTION IJ SOLN
INTRAMUSCULAR | Status: AC
  Filled 2018-06-21: qty 10

## 2018-06-21 MED ORDER — ROCURONIUM BROMIDE 50 MG/5ML IV SOSY
PREFILLED_SYRINGE | INTRAVENOUS | Status: DC | PRN
  Administered 2018-06-21: 50 mg via INTRAVENOUS
  Administered 2018-06-21: 20 mg via INTRAVENOUS

## 2018-06-21 MED ORDER — ATROPINE SULFATE 1 % OP SOLN
OPHTHALMIC | Status: DC | PRN
  Administered 2018-06-21: 1 [drp] via OPHTHALMIC

## 2018-06-21 MED ORDER — DEXAMETHASONE SODIUM PHOSPHATE 10 MG/ML IJ SOLN
INTRAMUSCULAR | Status: AC
  Filled 2018-06-21: qty 1

## 2018-06-21 MED ORDER — GATIFLOXACIN 0.5 % OP SOLN
OPHTHALMIC | Status: AC
  Filled 2018-06-21: qty 2.5

## 2018-06-21 MED ORDER — TROPICAMIDE 1 % OP SOLN
1.0000 [drp] | OPHTHALMIC | Status: AC | PRN
  Administered 2018-06-21 (×3): 1 [drp] via OPHTHALMIC
  Filled 2018-06-21: qty 15

## 2018-06-21 MED ORDER — INDOCYANINE GREEN 25 MG IV SOLR
INTRAVENOUS | Status: DC | PRN
  Administered 2018-06-21: 1.7 mg via OPHTHALMIC

## 2018-06-21 MED ORDER — SUGAMMADEX SODIUM 200 MG/2ML IV SOLN
INTRAVENOUS | Status: DC | PRN
  Administered 2018-06-21 (×2): 100 mg via INTRAVENOUS

## 2018-06-21 MED ORDER — SODIUM CHLORIDE (PF) 0.9 % IJ SOLN
INTRAMUSCULAR | Status: AC
  Filled 2018-06-21: qty 10

## 2018-06-21 MED ORDER — MIDAZOLAM HCL 2 MG/2ML IJ SOLN
INTRAMUSCULAR | Status: AC
  Filled 2018-06-21: qty 2

## 2018-06-21 MED ORDER — STERILE WATER FOR IRRIGATION IR SOLN
Status: DC | PRN
  Administered 2018-06-21: 1000 mL

## 2018-06-21 MED ORDER — BACITRACIN-POLYMYXIN B 500-10000 UNIT/GM OP OINT
TOPICAL_OINTMENT | OPHTHALMIC | Status: DC | PRN
  Administered 2018-06-21: 1 via OPHTHALMIC

## 2018-06-21 MED ORDER — BRIMONIDINE TARTRATE 0.2 % OP SOLN
OPHTHALMIC | Status: DC | PRN
  Administered 2018-06-21: 1 [drp] via OPHTHALMIC

## 2018-06-21 MED ORDER — INDOCYANINE GREEN 25 MG IV SOLR
INTRAVENOUS | Status: AC
  Filled 2018-06-21: qty 25

## 2018-06-21 MED ORDER — SODIUM CHLORIDE 0.9 % IV SOLN
INTRAVENOUS | Status: DC | PRN
  Administered 2018-06-21: 35 ug/min via INTRAVENOUS

## 2018-06-21 MED ORDER — POLYMYXIN B SULFATE 500000 UNITS IJ SOLR
INTRAMUSCULAR | Status: AC
  Filled 2018-06-21: qty 500000

## 2018-06-21 MED ORDER — 0.9 % SODIUM CHLORIDE (POUR BTL) OPTIME
TOPICAL | Status: DC | PRN
  Administered 2018-06-21: 1000 mL

## 2018-06-21 MED ORDER — FENTANYL CITRATE (PF) 100 MCG/2ML IJ SOLN
INTRAMUSCULAR | Status: DC | PRN
  Administered 2018-06-21: 150 ug via INTRAVENOUS

## 2018-06-21 MED ORDER — DORZOLAMIDE HCL-TIMOLOL MAL 2-0.5 % OP SOLN
OPHTHALMIC | Status: AC
  Filled 2018-06-21: qty 10

## 2018-06-21 MED ORDER — GATIFLOXACIN 0.5 % OP SOLN OPTIME - NO CHARGE
OPHTHALMIC | Status: DC | PRN
  Administered 2018-06-21: 1 [drp] via OPHTHALMIC

## 2018-06-21 SURGICAL SUPPLY — 51 items
APL SWBSTK 6 STRL LF DISP (MISCELLANEOUS) ×4
APPLICATOR COTTON TIP 6 STRL (MISCELLANEOUS) ×4 IMPLANT
APPLICATOR COTTON TIP 6IN STRL (MISCELLANEOUS) ×20 IMPLANT
BANDAGE EYE OVAL (MISCELLANEOUS) ×3 IMPLANT
BLADE MVR KNIFE 20G (BLADE) ×2 IMPLANT
CABLE BIPOLOR RESECTION CORD (MISCELLANEOUS) ×2 IMPLANT
CANNULA FLEX TIP 25G (CANNULA) ×2 IMPLANT
CLSR STERI-STRIP ANTIMIC 1/2X4 (GAUZE/BANDAGES/DRESSINGS) ×2 IMPLANT
DRAPE MICROSCOPE LEICA 46X105 (MISCELLANEOUS) ×2 IMPLANT
DRAPE OPHTHALMIC 77X100 STRL (CUSTOM PROCEDURE TRAY) ×2 IMPLANT
FILTER BLUE MILLIPORE (MISCELLANEOUS) ×2 IMPLANT
FORCEPS GRIESHABER ILM 25G A (INSTRUMENTS) IMPLANT
GAS AUTO FILL CONSTEL (OPHTHALMIC) ×2
GAS AUTO FILL CONSTELLATION (OPHTHALMIC) IMPLANT
GLOVE BIO SURGEON STRL SZ7.5 (GLOVE) ×4 IMPLANT
GLOVE BIOGEL M 7.0 STRL (GLOVE) ×2 IMPLANT
GOWN STRL REUS W/ TWL LRG LVL3 (GOWN DISPOSABLE) ×2 IMPLANT
GOWN STRL REUS W/ TWL XL LVL3 (GOWN DISPOSABLE) ×1 IMPLANT
GOWN STRL REUS W/TWL LRG LVL3 (GOWN DISPOSABLE) ×2
GOWN STRL REUS W/TWL XL LVL3 (GOWN DISPOSABLE) ×4
KIT BASIN OR (CUSTOM PROCEDURE TRAY) ×2 IMPLANT
NDL 18GX1X1/2 (RX/OR ONLY) (NEEDLE) ×1 IMPLANT
NDL 25GX 5/8IN NON SAFETY (NEEDLE) ×5 IMPLANT
NDL HYPO 30X.5 LL (NEEDLE) ×2 IMPLANT
NEEDLE 18GX1X1/2 (RX/OR ONLY) (NEEDLE) ×2 IMPLANT
NEEDLE 25GX 5/8IN NON SAFETY (NEEDLE) ×10 IMPLANT
NEEDLE HYPO 30X.5 LL (NEEDLE) ×4 IMPLANT
NS IRRIG 1000ML POUR BTL (IV SOLUTION) ×2 IMPLANT
PACK VITRECTOMY CUSTOM (CUSTOM PROCEDURE TRAY) ×2 IMPLANT
PAD ARMBOARD 7.5X6 YLW CONV (MISCELLANEOUS) ×3 IMPLANT
PAK PIK VITRECTOMY CVS 25GA (OPHTHALMIC) ×2 IMPLANT
PENCIL BIPOLAR 25GA STR DISP (OPHTHALMIC RELATED) ×2 IMPLANT
PROBE ENDO DIATHERMY 25G (MISCELLANEOUS) ×3 IMPLANT
SUT CHROMIC 7 0 TG140 8 (SUTURE) ×1 IMPLANT
SUT ETHILON 5.0 S-24 (SUTURE) ×1 IMPLANT
SUT ETHILON 9 0 TG140 8 (SUTURE) ×1 IMPLANT
SUT MERSILENE 4 0 RV 2 (SUTURE) ×1 IMPLANT
SUT SILK 2 0 (SUTURE)
SUT SILK 2 0 TIES 17X18 (SUTURE)
SUT SILK 2-0 18XBRD TIE 12 (SUTURE) ×1 IMPLANT
SUT SILK 2-0 18XBRD TIE BLK (SUTURE) ×1 IMPLANT
SUT SILK 4 0 RB 1 (SUTURE) ×1 IMPLANT
SUT VICRYL 7 0 TG140 8 (SUTURE) ×2 IMPLANT
SYR 10ML LL (SYRINGE) ×3 IMPLANT
SYR 20CC LL (SYRINGE) ×2 IMPLANT
SYR 5ML LL (SYRINGE) ×3 IMPLANT
SYR BULB 3OZ (MISCELLANEOUS) ×2 IMPLANT
SYR TB 1ML LUER SLIP (SYRINGE) ×1 IMPLANT
TOWEL NATURAL 6PK STERILE (DISPOSABLE) ×2 IMPLANT
TUBING HIGH PRESS EXTEN 6IN (TUBING) ×2 IMPLANT
WATER STERILE IRR 1000ML POUR (IV SOLUTION) ×2 IMPLANT

## 2018-06-21 NOTE — Op Note (Signed)
Date of procedure:  06/21/2018  Surgeon: Bernarda Caffey, MD, PhD  Pre-operative Diagnoses: Full thickness macular hole, Left Eye  Post-operative diagnosis: Full thickness macular hole, Left Eye Retinal tears w/ focal retinal detachment, Left Eye  Anesthesia: General  Procedure:  1. 25 gauge pars plana vitrectomy, Left Eye 2. Indocyanine green stain, Left Eye 3. Internal Limiting Membrane peel, Left Eye 4. FAX, Left Eye 5. Endolaser, Left Eye 6. Injection 14% C3F8, Left Eye   Indications for procedure: The patient presented with a full thickness macular hole and complaint of central visual loss consistent with a scotoma. After discussing the risks, benefits, and alternatives to surgery, the patient electively decided to undergo surgical repair and informed consent was obtained. The surgery was an attempt to close the macular hole and potentially improve the vision within the reasonable expectations of the surgeon.  Procedure in Detail:  The patient was met in the pre-operative holding area where their identification data was verified. It was noted that there was a signed, informed consent in the chart and the Left Eye eye was verbally verified by the patient as the operative eye and was marked with a marking pen.The patient was then taken to the operating room and placed in the supine position. General endotracheal anesthesia was induced.  The eye was then prepped with 5% betadine and draped in the normal fashion for ophthalmic surgery. The microscope was draped and swung into position, and a secondary time-out was performed to identify the correct patient, eyes, procedures, and any allergies.  A 25 gauge trocar was inserted in a 30-45 degrees fashion into the inferotemporal quadrant 4 mm posterior to the limbus in this phakic patient. Correct positioning within the vitreous was verified externally with the light pipe. The infusion was then connected to the cannula and BSS  infusion was commenced. Additional ports were placed in the superonasal and superotemporal quadrants.Viscoat was placed on the cornea. The BIOM was used to visualize the posterior segment while the core vitrectomy was completed. The patient had a visible full thickness macular hole and a posterior vitreous detachment was confirmed using suction over the optic nerve head and lifting anteriorly. The remaining vitreous was removed. Kenalog was used to aid in this process. A thorough peripheral vitrectomy was performed. Of note, there were 2 retinal tears along the vitreous base, inferiorly at 630 and 0700 OS, with surrounding SRF / focal retinal detachment.   Pre-diluted indocyanine green was then used to stain the internal limiting membrane. A macular contact lens was placed on the eye. End-grasping ILM forceps were used to create an opening in the ILM and the ILM was peeled fully from the macula taking care to avoid traction on the macular hole.   The wide angle viewing system was brought back into position. Scleral depression was performed and used to meticulously shave the thick and adherent vitreous base. A focal inferior retinal detachment with multiple retinal tears was noted. The retinal tears were marked with diathermy and the vitreous traction was relieved from all the tears. Next, a fluid air exchange was performed and subretinal fluid was drained from the focal detachment, allowing the retina to reattach flat. Endolaser was then used to surround the inferior tears with three rows of laser spots plus additional 360 laser.  The superotemporal port was then removed and sutured with 7-0 vicryl, there was no leakage. 14% C3F8 gas was connected to the infusion line and gas was injected into the posterior segment while venting air through the  superonasal trocar using the extrusion cannula. Once a full, 40cc of gas was vented through the eye, the infusion port and venting ports were removed and they  were sutured with 7-0 vicryl. There was no leakage from the sclerotomy sites.  Subconjunctival injections of kefzol + bacitracin + polymixin b and kenalog were then administered, and antibiotic and steroid drops as well as antibiotic ointment were placed in the eye. The drapes were removed and the eye was patched and shielded. A green gas bracelet was placed on the patient's left wrist. The patient was then taken to the post-operative area for recovery having tolerated the procedure well. She was instructed to perform face down positioning postoperatively and to follow up in clinic the following morning as scheduled.  Estimate blood lost: none Complications: None

## 2018-06-21 NOTE — Anesthesia Postprocedure Evaluation (Signed)
Anesthesia Post Note  Patient: Dominique Lee  Procedure(s) Performed: 25 GAUGE PARS PLANA VITRECTOMY WITH 20 GAUGE MVR PORT FOR MACULAR HOLE (Left Eye) MEMBRANE PEEL (Left Eye) GAS/FLUID EXCHANGE (Left Eye) PHOTOCOAGULATION WITH LASER (Left Eye)     Patient location during evaluation: PACU Anesthesia Type: General Level of consciousness: awake and alert Pain management: pain level controlled Vital Signs Assessment: post-procedure vital signs reviewed and stable Respiratory status: spontaneous breathing, nonlabored ventilation, respiratory function stable and patient connected to nasal cannula oxygen Cardiovascular status: blood pressure returned to baseline and stable Postop Assessment: no apparent nausea or vomiting Anesthetic complications: no    Last Vitals:  Vitals:   06/21/18 0914 06/21/18 1411  BP: (!) 166/74   Pulse: 66   Resp: 20   Temp: 36.9 C (!) 36.3 C  SpO2: 99%     Last Pain:  Vitals:   06/21/18 1411  TempSrc:   PainSc: [place] DAVID

## 2018-06-21 NOTE — Discharge Instructions (Signed)
POSTOPERATIVE INSTRUCTIONS  Your doctor has performed vitreoretinal surgery on you at [facility].  - Keep eye patched and shielded until seen by Dr. Elonda Giuliano 8 AM tomorrow in clinic - Do not use drops until return - FACE DOWN POSITIONING WHILE AWAKE - Sleep with belly down or on right side, avoid laying flat on back.    - No strenuous bending, stooping or lifting.  - You may not drive until further notice.  - If your doctor used a gas bubble in your eye during the procedure he will advise you on postoperative positioning. If you have a gas bubble you will be wearing a green bracelet that was applied in the operating room. The green bracelet should stay on as long as the gas bubble is in your eye. While the gas bubble is present you should not fly in an airplane. If you require general anesthesia while the gas bubble is present you must notify your anesthesiologist that an intraocular gas bubble is present so he can take the appropriate precautions.  - Tylenol or any other over-the-counter pain reliever can be used according to your doctor. If more pain medicine is required, your doctor will have a prescription for you.  - You may read, go up and down stairs, and watch television.     Kyndahl Jablon, M.D., Ph.D.  

## 2018-06-21 NOTE — Brief Op Note (Signed)
06/21/2018  2:14 PM  PATIENT:  Dominique Lee  68 y.o. female  PRE-OPERATIVE DIAGNOSIS:  macular hole, left eye  POST-OPERATIVE DIAGNOSIS:  macular hole, left eye Focal retinal detachment, left eye  PROCEDURE:  Procedure(s) with comments: 25 GAUGE PARS PLANA VITRECTOMY WITH 20 GAUGE MVR PORT FOR MACULAR HOLE (Left) MEMBRANE PEEL (Left) GAS/FLUID EXCHANGE (Left) - C3F8 PHOTOCOAGULATION WITH LASER (Left)  SURGEON:  Surgeon(s) and Role:    Bernarda Caffey, MD - Primary  ASSISTANTS: Ernest Mallick, OA   ANESTHESIA:   general  EBL:  5 mL   BLOOD ADMINISTERED:none  DRAINS: none   LOCAL MEDICATIONS USED:  NONE  SPECIMEN:  No Specimen  DISPOSITION OF SPECIMEN:  N/A  COUNTS:  YES  TOURNIQUET:  * No tourniquets in log *  DICTATION: .Note written in EPIC  PLAN OF CARE: Discharge to home after PACU  PATIENT DISPOSITION:  PACU - hemodynamically stable.   Delay start of Pharmacological VTE agent (>24hrs) due to surgical blood loss or risk of bleeding: not applicable

## 2018-06-21 NOTE — Anesthesia Preprocedure Evaluation (Signed)
Anesthesia Evaluation  Patient identified by MRN, date of birth, ID band Patient awake    Reviewed: Allergy & Precautions, NPO status   History of Anesthesia Complications (+) PONV and Family history of anesthesia reaction  Airway Mallampati: I  TM Distance: >3 FB Neck ROM: Full    Dental   Pulmonary former smoker,    Pulmonary exam normal        Cardiovascular Normal cardiovascular exam     Neuro/Psych    GI/Hepatic   Endo/Other    Renal/GU      Musculoskeletal   Abdominal   Peds  Hematology   Anesthesia Other Findings   Reproductive/Obstetrics                             Anesthesia Physical Anesthesia Plan  ASA: II  Anesthesia Plan: General   Post-op Pain Management:    Induction: Intravenous  PONV Risk Score and Plan: 4 or greater and Midazolam, Ondansetron, Dexamethasone and Treatment may vary due to age or medical condition  Airway Management Planned: Oral ETT  Additional Equipment:   Intra-op Plan:   Post-operative Plan: Extubation in OR  Informed Consent: I have reviewed the patients History and Physical, chart, labs and discussed the procedure including the risks, benefits and alternatives for the proposed anesthesia with the patient or authorized representative who has indicated his/her understanding and acceptance.     Plan Discussed with: CRNA and Surgeon  Anesthesia Plan Comments:         Anesthesia Quick Evaluation

## 2018-06-21 NOTE — Anesthesia Procedure Notes (Signed)
Procedure Name: Intubation Date/Time: 06/21/2018 11:52 AM Performed by: Trinna Post., CRNA Pre-anesthesia Checklist: Patient identified, Emergency Drugs available, Suction available, Patient being monitored and Timeout performed Patient Re-evaluated:Patient Re-evaluated prior to induction Oxygen Delivery Method: Circle system utilized Preoxygenation: Pre-oxygenation with 100% oxygen Induction Type: IV induction Ventilation: Mask ventilation without difficulty Laryngoscope Size: Mac and 3 Grade View: Grade I Tube type: Oral Tube size: 7.0 mm Number of attempts: 1 Airway Equipment and Method: Stylet Placement Confirmation: ETT inserted through vocal cords under direct vision,  positive ETCO2 and breath sounds checked- equal and bilateral Secured at: 22 cm Tube secured with: Tape Dental Injury: Teeth and Oropharynx as per pre-operative assessment

## 2018-06-21 NOTE — Interval H&P Note (Signed)
History and Physical Interval Note:  06/21/2018 11:08 AM  Dominique Lee  has presented today for surgery, with the diagnosis of macular hole, left eye  The various methods of treatment have been discussed with the patient and family. After consideration of risks, benefits and other options for treatment, the patient has consented to  Procedure(s): 25 GAUGE PARS PLANA VITRECTOMY WITH 20 GAUGE MVR PORT FOR MACULAR HOLE (Left) as a surgical intervention .  The patient's history has been reviewed, patient examined, no change in status, stable for surgery.  I have reviewed the patient's chart and labs.  Questions were answered to the patient's satisfaction.     Bernarda Caffey

## 2018-06-21 NOTE — Transfer of Care (Signed)
Immediate Anesthesia Transfer of Care Note  Patient: Dominique Lee  Procedure(s) Performed: 25 GAUGE PARS PLANA VITRECTOMY WITH 20 GAUGE MVR PORT FOR MACULAR HOLE (Left Eye) MEMBRANE PEEL (Left Eye) GAS/FLUID EXCHANGE (Left Eye) PHOTOCOAGULATION WITH LASER (Left Eye)  Patient Location: PACU  Anesthesia Type:General  Level of Consciousness: awake and drowsy  Airway & Oxygen Therapy: Patient Spontanous Breathing and Patient connected to face mask oxygen  Post-op Assessment: Report given to RN and Post -op Vital signs reviewed and stable  Post vital signs: Reviewed and stable  Last Vitals:  Vitals Value Taken Time  BP 124/63 06/21/2018  2:10 PM  Temp    Pulse 74 06/21/2018  2:11 PM  Resp 13 06/21/2018  2:11 PM  SpO2 98 % 06/21/2018  2:11 PM  Vitals shown include unvalidated device data.  Last Pain:  Vitals:   06/21/18 1012  TempSrc:   PainSc: 0-No pain      Patients Stated Pain Goal: 4 (04/20/27 1188)  Complications: No apparent anesthesia complications

## 2018-06-21 NOTE — Progress Notes (Signed)
[facility] Note  06/22/2018     CHIEF COMPLAINT Patient presents for Post-op Follow-up   HISTORY OF PRESENT ILLNESS: Dominique Lee is a 68 y.o. female who presents to the clinic today for:   HPI    Post-op Follow-up    In left eye.  Discomfort includes Negative for pain, itching, foreign body sensation, tearing, discharge and floaters.  Vision is stable.  I, the attending physician,  performed the HPI with the patient and updated documentation appropriately.          Comments    POV#1 FTMH. Patient states she maintained head position 90% of the time, she slept face down. Rates pain 1. She took Tylenol mainly for her arthritis. Pt woke this am with nausea ,she took Phenergan with some relief.        Last edited by Bernarda Caffey, MD on 06/24/2018 11:51 PM. (History)    pt states she did well last night, states she was not in much pain, she states she took tylenol for arthritis mainly, pt states she is very nauseous this morning from moving around  Referring physician: No referring provider defined for this encounter.  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 Cotter) [BCVA: OD: 20/40- OS: 20/100-] Ocular Hx-central serous chorioretinopathy, DES PMH-arrthymia, HLD, Rheumatoid Arthritis, Thyroid disease    CURRENT MEDICATIONS: Current Outpatient Medications (Ophthalmic Drugs)  Medication Sig  . Propylene Glycol (SYSTANE COMPLETE) 0.6 % SOLN Place 1 drop into both eyes 3 (three) times daily as needed (dry/irritated eyes.).   No current facility-administered medications for this visit.  (Ophthalmic Drugs)   Current Outpatient Medications (Other)  Medication Sig  . acetaminophen (TYLENOL) 500 MG tablet Take 1,000 mg by mouth at bedtime.  . Cholecalciferol (VITAMIN D-1000 MAX ST) 25 MCG (1000 UT) tablet Take 1,000 Units by  mouth daily with lunch.  . cyclobenzaprine (FEXMID) 7.5 MG tablet Take 1 tablet (7.5 mg total) by mouth 3 (three) times daily as needed for muscle spasms.  Marland Kitchen enoxaparin (LOVENOX) 40 MG/0.4ML injection Inject 0.4 mLs (40 mg total) into the skin daily.  . fluticasone (FLONASE) 50 MCG/ACT nasal spray Place 1 spray into both nostrils daily as needed for allergies or rhinitis.  . folic acid (FOLVITE) 1 MG tablet Take 1 mg by mouth daily with lunch.   . ibuprofen (ADVIL,MOTRIN) 200 MG tablet Take 1 tablet (200 mg total) by mouth 3 (three) times daily.  Marland Kitchen leflunomide (ARAVA) 10 MG tablet Take 10 mg by mouth daily with lunch.   . levothyroxine (SYNTHROID, LEVOTHROID) 75 MCG tablet Take 75 mcg by mouth daily before breakfast.   . methotrexate 2.5 MG tablet Take 15 mg by mouth every Thursday. Thursdays with evening meal  . metoprolol succinate (TOPROL-XL) 50 MG 24 hr tablet Take 1-1.5 tablets (50-75 mg total) by mouth daily. (Patient taking differently: Take 50 mg by mouth at bedtime. )  . polyethylene glycol (MIRALAX / GLYCOLAX) packet Take 17 g by mouth daily as needed for mild constipation.  Marland Kitchen omeprazole (PRILOSEC) 20 MG capsule Take 1 capsule (20 mg total) by mouth daily for 14 days. (Patient not taking: Reported on 06/08/2018)   No current facility-administered medications for this visit.  (Other)      REVIEW OF SYSTEMS: ROS    Positive for: Eyes   Negative for: Constitutional, Gastrointestinal, Neurological, Skin, Genitourinary, Musculoskeletal, HENT,  Endocrine, Cardiovascular, Respiratory, Psychiatric, Allergic/Imm, Heme/Lymph   Last edited by Zenovia Jordan, LPN on 02/24/6733  1:93 AM. (History)       ALLERGIES Allergies  Allergen Reactions  . Codeine Other (See Comments)    Agitation   . Morphine And Related Other (See Comments)    agitation   . Statins Other (See Comments)    Joint  pain    PAST MEDICAL HISTORY Past Medical History:  Diagnosis Date  . Cancer (HCC)    Basal  cell- skin  . Complication of anesthesia   . COPD (chronic obstructive pulmonary disease) ([place])   . Dysrhythmia    [name]  . Family history of adverse reaction to anesthesia    Mother N/V  . Fibromyalgia   . High cholesterol   . Hypothyroidism   . Irregular heart beat   . Osteoarthritis   . Pneumothorax after biopsy   . PONV (postoperative nausea and vomiting)   . [name] (premature ventricular contraction)   . RA (rheumatoid arthritis) ([place])   . Thyroid disease    Hypothyroid   Past Surgical History:  Procedure Laterality Date  . [place] VITRECTOMY WITH 20 GAUGE MVR PORT FOR MACULAR HOLE Left 06/21/2018   Procedure: 25 GAUGE PARS PLANA VITRECTOMY WITH 20 GAUGE MVR PORT FOR MACULAR HOLE;  Surgeon: Bernarda Caffey, MD;  Location: [facility];  Service: Ophthalmology;  Laterality: Left;  . ABDOMINAL HYSTERECTOMY  2000  . BREAST SURGERY     saline implants  . CARDIOVASCULAR STRESS TEST  09/02/2005   Cardiolite Myocardial perfusion study; NegativeBruce protocol exercise stress test  . GAS/FLUID EXCHANGE Left 06/21/2018   Procedure: GAS/FLUID EXCHANGE;  Surgeon: Bernarda Caffey, MD;  Location: [facility];  Service: Ophthalmology;  Laterality: Left;  C3F8  . HIP PINNING,CANNULATED Right 12/18/2017   Procedure: CANNULATED HIP PINNING;  Surgeon: Hiram Gash, MD;  Location: [facility];  Service: Orthopedics;  Laterality: Right;  . LAPAROSCOPIC PARTIAL COLECTOMY Left 05/18/2017  . Lung Biospy    . MEMBRANE PEEL Left 06/21/2018   Procedure: MEMBRANE PEEL;  Surgeon: Bernarda Caffey, MD;  Location: [facility];  Service: Ophthalmology;  Laterality: Left;  . NASAL SINUS SURGERY    . PHOTOCOAGULATION WITH LASER Left 06/21/2018   Procedure: PHOTOCOAGULATION WITH LASER;  Surgeon: Bernarda Caffey, MD;  Location: [facility];  Service: Ophthalmology;  Laterality: Left;  . TUBAL LIGATION    . US ECHOCARDIOGRAPHY  12/26/2011   mild abnormalities    FAMILY HISTORY Family History  Problem Relation Age of Onset  . Hypertension Mother    . Cancer - Lung Father 43       deceased  . Heart attack Maternal Grandmother   . Cancer Maternal Grandfather   . Diabetes Paternal Grandfather   . Diabetes Brother   . Cancer - Colon Sister     SOCIAL HISTORY Social History   Tobacco Use  . Smoking status: Former Smoker    Types: Cigarettes    Last attempt to quit: 11/04/2002    Years since quitting: 15.6  . Smokeless tobacco: Never Used  Substance Use Topics  . Alcohol use: No  . Drug use: No         OPHTHALMIC EXAM:  Base Eye Exam    Visual Acuity (Snellen - Linear)      Right Left   Dist [place] 20/40 CF at 2'       Tonometry (Tonopen, 8:22 AM)      Right Left   Pressure  15 18       Pupils      Dark Light Shape React APD   Right 3 2 Round Brisk None   Left 3  Round         Visual Fields (Counting fingers)      Left Right     Full       Extraocular Movement      Right Left    Full Full       Neuro/Psych    Oriented x3:  Yes       Dilation    Both eyes:  1.0% Mydriacyl, 2.5% Phenylephrine @ 8:21 AM        Slit Lamp and Fundus Exam    Slit Lamp Exam      Right Left   Lids/Lashes Dermatochalasis - upper lid periorbital edema   Conjunctiva/Sclera White and quiet Mild subconj heme, sututes intact   Cornea 1-2+ Punctate epithelial erosions Mild epi defect   Anterior Chamber Deep and clear Deep and clear   Iris Round and moderately dilated Round and moderately dilated to 15mm   Lens 2+ Nuclear sclerosis, 2+ Cortical cataract 2+ Nuclear sclerosis, 2+ Cortical cataract, 2+ Posterior subcapsular cataract   Vitreous Vitreous syneresis, Posterior vitreous detachment Vitreous syneresis, Posterior vitreous detachment       Fundus Exam      Right Left   Disc Pink and Sharp Pink and Sharp   C/D Ratio 0.5 0.5   Macula Flat, Blunted foveal reflex, RPE mottling and clumping, No heme or edema Hazy view, flat under gas   Vessels mild Copper wiring, Vascular attenuation, mild AV crossing changes mild Copper  wiring, Vascular attenuation, mild AV crossing changes   Periphery Attached, No RT/RD Attached, good laser 360          IMAGING AND PROCEDURES  Imaging and Procedures for @TODAY @           ASSESSMENT/PLAN:    ICD-10-CM   1. Macular hole of left eye H35.342   2. Retinal edema H35.81   3. Essential hypertension I10   4. Hypertensive retinopathy of both eyes H35.033   5. Combined forms of age-related cataract of both eyes H25.813   6. Dry eyes H04.123     1,2. Full Thickness Macular Hole OS - preop BCVA 20/100 w/ central scotoma OS - POD1, s/p PPV/ICG/Membrane peel/C3F8, 01.02.20             - doing well this morning             - hazy view but retina attached under gas             - IOP 18 OS  - Start  Zymaxid QID OS            Brimonidine BID OS            Atropine BID OS            Pred Forte 6 times a day OS            PSO ung QID             - cont face down positioning x3 days; avoid laying flat on back             - eye shield when sleeping             - post op drop and positioning instructions reviewed             -  tylenol/ibuprofen for pain - F/U 1 week  3,4. Hypertensive retinopathy OU - discussed importance of tight BP control - monitor  5. Mixed for age related cataracts  - The symptoms of cataract, surgical options, and treatments and risks were discussed with patient.  - discussed diagnosis and progression and likelihood of cataract progression OS with PPV  - not yet visually significant  - monitor for now  6. Dry eyes OU - recommend artificial tears and lubricating ointment as needed   Ophthalmic Meds Ordered this visit:  No orders of the defined types were placed in this encounter.      Return in about 1 week (around 06/29/2018) for F/U FTMH, DFE.  There are no Patient Instructions on file for this visit.   Explained the diagnoses, plan, and follow up with the patient and they expressed understanding.  Patient expressed  understanding of the importance of proper follow up care.   This document serves as a record of services personally performed by Gardiner Sleeper, MD, PhD. It was created on their behalf by Ernest Mallick, OA, an ophthalmic assistant. The creation of this record is the provider's dictation and/or activities during the visit.    Electronically signed by: Ernest Mallick, OA  01.02.2020 11:51 PM    Gardiner Sleeper, M.D., Ph.D. Diseases & Surgery of the Retina and Vitreous [facility]   I have reviewed the above documentation for accuracy and completeness, and I agree with the above. Gardiner Sleeper, M.D., Ph.D. 06/24/18 11:54 PM   Abbreviations: M myopia (nearsighted); A astigmatism; H hyperopia (farsighted); P presbyopia; Mrx spectacle prescription;  CTL contact lenses; OD right eye; OS left eye; OU both eyes  XT exotropia; ET esotropia; PEK punctate epithelial keratitis; PEE punctate epithelial erosions; DES dry eye syndrome; MGD meibomian gland dysfunction; ATs artificial tears; PFAT's preservative free artificial tears; Nelson nuclear sclerotic cataract; PSC posterior subcapsular cataract; ERM epi-retinal membrane; PVD posterior vitreous detachment; RD retinal detachment; DM diabetes mellitus; DR diabetic retinopathy; NPDR non-proliferative diabetic retinopathy; PDR proliferative diabetic retinopathy; CSME clinically significant macular edema; DME diabetic macular edema; dbh dot blot hemorrhages; CWS cotton wool spot; POAG primary open angle glaucoma; C/D cup-to-disc ratio; HVF humphrey visual field; GVF goldmann visual field; OCT optical coherence tomography; IOP intraocular pressure; BRVO Branch retinal vein occlusion; CRVO central retinal vein occlusion; CRAO central retinal artery occlusion; BRAO branch retinal artery occlusion; RT retinal tear; SB scleral buckle; PPV pars plana vitrectomy; VH Vitreous hemorrhage; PRP panretinal laser photocoagulation; IVK intravitreal kenalog;  VMT vitreomacular traction; MH Macular hole;  NVD neovascularization of the disc; NVE neovascularization elsewhere; AREDS age related eye disease study; ARMD age related macular degeneration; POAG primary open angle glaucoma; EBMD epithelial/anterior basement membrane dystrophy; ACIOL anterior chamber intraocular lens; IOL intraocular lens; PCIOL posterior chamber intraocular lens; Phaco/IOL phacoemulsification with intraocular lens placement; Upson photorefractive keratectomy; LASIK laser assisted in situ keratomileusis; HTN hypertension; DM diabetes mellitus; COPD chronic obstructive pulmonary disease

## 2018-06-22 ENCOUNTER — Ambulatory Visit (INDEPENDENT_AMBULATORY_CARE_PROVIDER_SITE_OTHER): Payer: Medicare Other | Admitting: Ophthalmology

## 2018-06-22 ENCOUNTER — Encounter (HOSPITAL_COMMUNITY): Payer: Self-pay | Admitting: Ophthalmology

## 2018-06-22 DIAGNOSIS — I10 Essential (primary) hypertension: Secondary | ICD-10-CM

## 2018-06-22 DIAGNOSIS — H25813 Combined forms of age-related cataract, bilateral: Secondary | ICD-10-CM

## 2018-06-22 DIAGNOSIS — H35033 Hypertensive retinopathy, bilateral: Secondary | ICD-10-CM

## 2018-06-22 DIAGNOSIS — H35342 Macular cyst, hole, or pseudohole, left eye: Secondary | ICD-10-CM

## 2018-06-22 DIAGNOSIS — H04123 Dry eye syndrome of bilateral lacrimal glands: Secondary | ICD-10-CM

## 2018-06-22 DIAGNOSIS — H3581 Retinal edema: Secondary | ICD-10-CM

## 2018-06-24 ENCOUNTER — Encounter (INDEPENDENT_AMBULATORY_CARE_PROVIDER_SITE_OTHER): Payer: Self-pay | Admitting: Ophthalmology

## 2018-06-27 NOTE — Progress Notes (Signed)
[facility] Note  06/29/2018     CHIEF COMPLAINT Patient presents for Post-op Follow-up   HISTORY OF PRESENT ILLNESS: Dominique Lee is a 68 y.o. female who presents to the clinic today for:   HPI    Post-op Follow-up    In left eye.  Discomfort includes Negative for pain, itching, foreign body sensation, tearing, discharge and floaters.  Vision is stable.  I, the attending physician,  performed the HPI with the patient and updated documentation appropriately.          Comments    Pt presents for POV for [place] (s/p PPV 01.02), pt states the first 4 days of recovery were rough, but things have gotten better, she states she is not having any eye pain, she says every once in awhile it feels scratchy, but drops usually help, she states she can see movement with OS, she states compliance with all gtts, pt states she is doing head down position 50 minutes out of 60       Last edited by Bernarda Caffey, MD on 06/29/2018  1:23 PM. (History)    pt states she did well last night, states she was not in much pain, she states she took tylenol for arthritis mainly, pt states she is very nauseous this morning from moving around  Referring physician: Geoffery Spruce, [place], [place] [id number]  HISTORICAL INFORMATION:   Selected notes from the [facility] 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 Cotter) [BCVA: OD: 20/40- OS: 20/100-] Ocular Hx-central serous chorioretinopathy, DES PMH-arrthymia, HLD, Rheumatoid Arthritis, Thyroid disease    CURRENT MEDICATIONS: Current Outpatient Medications (Ophthalmic Drugs)  Medication Sig  . bacitracin-polymyxin b (POLYSPORIN) ophthalmic ointment Place into the left eye 4 (four) times daily. Place a 1/2 inch ribbon of ointment into the lower eyelid.  . prednisoLONE acetate (PRED FORTE) 1 % ophthalmic suspension Place 1 drop into the left eye  4 (four) times daily.  Marland Kitchen Propylene Glycol (SYSTANE COMPLETE) 0.6 % SOLN Place 1 drop into both eyes 3 (three) times daily as needed (dry/irritated eyes.).   No current facility-administered medications for this visit.  (Ophthalmic Drugs)   Current Outpatient Medications (Other)  Medication Sig  . acetaminophen (TYLENOL) 500 MG tablet Take 1,000 mg by mouth at bedtime.  . Cholecalciferol (VITAMIN D-1000 MAX ST) 25 MCG (1000 UT) tablet Take 1,000 Units by mouth daily with lunch.  . cyclobenzaprine (FEXMID) 7.5 MG tablet Take 1 tablet (7.5 mg total) by mouth 3 (three) times daily as needed for muscle spasms.  Marland Kitchen enoxaparin (LOVENOX) 40 MG/0.4ML injection Inject 0.4 mLs (40 mg total) into the skin daily.  . fluticasone (FLONASE) 50 MCG/ACT nasal spray Place 1 spray into both nostrils daily as needed for allergies or rhinitis.  . folic acid (FOLVITE) 1 MG tablet Take 1 mg by mouth daily with lunch.   . ibuprofen (ADVIL,MOTRIN) 200 MG tablet Take 1 tablet (200 mg total) by mouth 3 (three) times daily.  Marland Kitchen leflunomide (ARAVA) 10 MG tablet Take 10 mg by mouth daily with lunch.   . levothyroxine (SYNTHROID, LEVOTHROID) 75 MCG tablet Take 75 mcg by mouth daily before breakfast.   . methotrexate 2.5 MG tablet Take 15 mg by mouth every Thursday. Thursdays with evening meal  . metoprolol succinate (TOPROL-XL) 50 MG 24 hr tablet Take 1-1.5 tablets (50-75 mg total) by mouth daily. (Patient taking differently: Take  50 mg by mouth at bedtime. )  . omeprazole (PRILOSEC) 20 MG capsule Take 1 capsule (20 mg total) by mouth daily for 14 days. (Patient not taking: Reported on 06/08/2018)  . polyethylene glycol (MIRALAX / GLYCOLAX) packet Take 17 g by mouth daily as needed for mild constipation.   No current facility-administered medications for this visit.  (Other)      REVIEW OF SYSTEMS: ROS    Positive for: Eyes   Negative for: Constitutional, Gastrointestinal, Neurological, Skin, Genitourinary,  Musculoskeletal, HENT, Endocrine, Cardiovascular, Respiratory, Psychiatric, Allergic/Imm, Heme/Lymph   Last edited by Debbrah Alar, COT on 06/29/2018  1:15 PM. (History)       ALLERGIES Allergies  Allergen Reactions  . Codeine Other (See Comments)    Agitation   . Morphine And Related Other (See Comments)    agitation   . Statins Other (See Comments)    Joint  pain    PAST MEDICAL HISTORY Past Medical History:  Diagnosis Date  . Cancer (HCC)    Basal cell- skin  . Complication of anesthesia   . COPD (chronic obstructive pulmonary disease) ([place])   . Dysrhythmia    [name]  . Family history of adverse reaction to anesthesia    Mother N/V  . Fibromyalgia   . High cholesterol   . Hypothyroidism   . Irregular heart beat   . Osteoarthritis   . Pneumothorax after biopsy   . PONV (postoperative nausea and vomiting)   . [name] (premature ventricular contraction)   . RA (rheumatoid arthritis) ([place])   . Thyroid disease    Hypothyroid   Past Surgical History:  Procedure Laterality Date  . [place] VITRECTOMY WITH 20 GAUGE MVR PORT FOR MACULAR HOLE Left 06/21/2018   Procedure: 25 GAUGE PARS PLANA VITRECTOMY WITH 20 GAUGE MVR PORT FOR MACULAR HOLE;  Surgeon: Bernarda Caffey, MD;  Location: [facility];  Service: Ophthalmology;  Laterality: Left;  . ABDOMINAL HYSTERECTOMY  2000  . BREAST SURGERY     saline implants  . CARDIOVASCULAR STRESS TEST  09/02/2005   Cardiolite Myocardial perfusion study; NegativeBruce protocol exercise stress test  . GAS/FLUID EXCHANGE Left 06/21/2018   Procedure: GAS/FLUID EXCHANGE;  Surgeon: Bernarda Caffey, MD;  Location: [facility];  Service: Ophthalmology;  Laterality: Left;  C3F8  . HIP PINNING,CANNULATED Right 12/18/2017   Procedure: CANNULATED HIP PINNING;  Surgeon: Hiram Gash, MD;  Location: [facility];  Service: Orthopedics;  Laterality: Right;  . LAPAROSCOPIC PARTIAL COLECTOMY Left 05/18/2017  . Lung Biospy    . MEMBRANE PEEL Left 06/21/2018   Procedure:  MEMBRANE PEEL;  Surgeon: Bernarda Caffey, MD;  Location: [facility];  Service: Ophthalmology;  Laterality: Left;  . NASAL SINUS SURGERY    . PHOTOCOAGULATION WITH LASER Left 06/21/2018   Procedure: PHOTOCOAGULATION WITH LASER;  Surgeon: Bernarda Caffey, MD;  Location: [facility];  Service: Ophthalmology;  Laterality: Left;  . TUBAL LIGATION    . US ECHOCARDIOGRAPHY  12/26/2011   mild abnormalities    FAMILY HISTORY Family History  Problem Relation Age of Onset  . Hypertension Mother   . Cancer - Lung Father 23       deceased  . Heart attack Maternal Grandmother   . Cancer Maternal Grandfather   . Diabetes Paternal Grandfather   . Diabetes Brother   . Cancer - Colon Sister     SOCIAL HISTORY Social History   Tobacco Use  . Smoking status: Former Smoker    Types: Cigarettes  Last attempt to quit: 11/04/2002    Years since quitting: 15.6  . Smokeless tobacco: Never Used  Substance Use Topics  . Alcohol use: No  . Drug use: No         OPHTHALMIC EXAM:  Base Eye Exam    Visual Acuity (Snellen - Linear)      Right Left   Dist [place] 20/20 -2 HM   Dist ph [place]  NI       Tonometry (Tonopen, 1:21 PM)      Right Left   Pressure 12 14       Pupils      Dark Light Shape React APD   Right 4 2 Round Brisk None   Left 5 5 Round NR None       Visual Fields      Left Right     Full   Restrictions Partial outer superior temporal, inferior temporal, superior nasal, inferior nasal deficiencies        Extraocular Movement      Right Left    Full, Ortho Full, Ortho       Neuro/Psych    Oriented x3:  Yes   Mood/Affect:  Normal       Dilation    Left eye:  1.0% Mydriacyl, 2.5% Phenylephrine @ 1:21 PM        Slit Lamp and Fundus Exam    Slit Lamp Exam      Right Left   Lids/Lashes Dermatochalasis - upper lid periorbital edema   Conjunctiva/Sclera White and quiet Mild subconj heme -- resolving, sututes intact   Cornea 1-2+ Punctate epithelial erosions 3+ Punctate epithelial  erosions, irregular epi inferiorly, endo pigment inferiorly   Anterior Chamber Deep and clear moderate depth   Iris Round and moderately dilated Round and moderately dilated   Lens 2+ Nuclear sclerosis, 2+ Cortical cataract 2+ Nuclear sclerosis, 2+ Cortical cataract, 3+ Posterior subcapsular cataract/feathering   Vitreous Vitreous syneresis, Posterior vitreous detachment Vitreous syneresis, Posterior vitreous detachment, 95% gas fill       Fundus Exam      Right Left   Disc Pink and Sharp perfused   C/D Ratio 0.5 0.5   Macula Flat, Blunted foveal reflex, RPE mottling and clumping, No heme or edema very Hazy view, grossly flat under gas, mac hole closing   Vessels mild Copper wiring, Vascular attenuation, mild AV crossing changes mild Copper wiring, Vascular attenuation, mild AV crossing changes   Periphery Attached, No RT/RD Attached, good laser 360          IMAGING AND PROCEDURES  Imaging and Procedures for @TODAY @           ASSESSMENT/PLAN:    ICD-10-CM   1. Macular hole of left eye H35.342 CANCELED: Intravitreal Injection, Pharmacologic Agent - OD - Right Eye  2. Retinal edema H35.81   3. Essential hypertension I10   4. Hypertensive retinopathy of both eyes H35.033   5. Combined forms of age-related cataract of both eyes H25.813   6. Dry eyes H04.123     1,2. Full Thickness Macular Hole OS - preop BCVA 20/100 w/ central scotoma OS - POW1, s/p PPV/ICG/Membrane peel/C3F8, 01.02.20             - doing well              - hazy view but retina attached under gas             - IOP 14 OS  - Cont  Zymaxid QID OS -- stop when bottle runs out            Brimonidine BID OS            Atropine BID OS -- stop when bottle runs out            Pred Forte 6 times a day OS            PSO ung QID             - cont face down positioning 50% of the time; avoid laying flat on back             - eye shield when sleeping -- one more week             - post op drop and positioning  instructions reviewed             - tylenol/ibuprofen for pain - F/U 3 week  3,4. Hypertensive retinopathy OU - discussed importance of tight BP control - monitor  5. Mixed for age related cataracts  - The symptoms of cataract, surgical options, and treatments and risks were discussed with patient.  - discussed diagnosis and progression and likelihood of cataract progression OS with PPV  - not yet visually significant  - monitor for now  6. Dry eyes OU - recommend artificial tears and lubricating ointment as needed   Ophthalmic Meds Ordered this visit:  Meds ordered this encounter  Medications  . prednisoLONE acetate (PRED FORTE) 1 % ophthalmic suspension    Sig: Place 1 drop into the left eye 4 (four) times daily.    Dispense:  15 mL    Refill:  0  . bacitracin-polymyxin b (POLYSPORIN) ophthalmic ointment    Sig: Place into the left eye 4 (four) times daily. Place a 1/2 inch ribbon of ointment into the lower eyelid.    Dispense:  3.5 g    Refill:  3       Return in about 3 weeks (around 07/20/2018) for f/u mac hole, DFE, OCT.  There are no Patient Instructions on file for this visit.   Explained the diagnoses, plan, and follow up with the patient and they expressed understanding.  Patient expressed understanding of the importance of proper follow up care.   This document serves as a record of services personally performed by Gardiner Sleeper, MD, PhD. It was created on their behalf by Ernest Mallick, OA, an ophthalmic assistant. The creation of this record is the provider's dictation and/or activities during the visit.    Electronically signed by: Ernest Mallick, OA  01.08.2020 2:13 AM    Gardiner Sleeper, M.D., Ph.D. Diseases & Surgery of the Retina and Vitreous [facility]  I have reviewed the above documentation for accuracy and completeness, and I agree with the above. Gardiner Sleeper, M.D., Ph.D. 07/01/18 2:15 AM    Abbreviations: M myopia  (nearsighted); A astigmatism; H hyperopia (farsighted); P presbyopia; Mrx spectacle prescription;  CTL contact lenses; OD right eye; OS left eye; OU both eyes  XT exotropia; ET esotropia; PEK punctate epithelial keratitis; PEE punctate epithelial erosions; DES dry eye syndrome; MGD meibomian gland dysfunction; ATs artificial tears; PFAT's preservative free artificial tears; [place] nuclear sclerotic cataract; PSC posterior subcapsular cataract; ERM epi-retinal membrane; PVD posterior vitreous detachment; RD retinal detachment; DM diabetes mellitus; DR diabetic retinopathy; NPDR non-proliferative diabetic retinopathy; PDR proliferative diabetic retinopathy; CSME clinically significant macular edema; DME diabetic macular edema; dbh  dot blot hemorrhages; CWS cotton wool spot; POAG primary open angle glaucoma; C/D cup-to-disc ratio; HVF humphrey visual field; GVF goldmann visual field; OCT optical coherence tomography; IOP intraocular pressure; BRVO Branch retinal vein occlusion; CRVO central retinal vein occlusion; CRAO central retinal artery occlusion; BRAO branch retinal artery occlusion; RT retinal tear; SB scleral buckle; PPV pars plana vitrectomy; VH Vitreous hemorrhage; PRP panretinal laser photocoagulation; IVK intravitreal kenalog; VMT vitreomacular traction; MH Macular hole;  NVD neovascularization of the disc; NVE neovascularization elsewhere; AREDS age related eye disease study; ARMD age related macular degeneration; POAG primary open angle glaucoma; EBMD epithelial/anterior basement membrane dystrophy; ACIOL anterior chamber intraocular lens; IOL intraocular lens; PCIOL posterior chamber intraocular lens; Phaco/IOL phacoemulsification with intraocular lens placement; [place] photorefractive keratectomy; LASIK laser assisted in situ keratomileusis; HTN hypertension; DM diabetes mellitus; COPD chronic obstructive pulmonary disease

## 2018-06-29 ENCOUNTER — Ambulatory Visit (INDEPENDENT_AMBULATORY_CARE_PROVIDER_SITE_OTHER): Payer: Medicare Other | Admitting: Ophthalmology

## 2018-06-29 ENCOUNTER — Encounter (INDEPENDENT_AMBULATORY_CARE_PROVIDER_SITE_OTHER): Payer: Self-pay | Admitting: Ophthalmology

## 2018-06-29 DIAGNOSIS — H25813 Combined forms of age-related cataract, bilateral: Secondary | ICD-10-CM

## 2018-06-29 DIAGNOSIS — H3581 Retinal edema: Secondary | ICD-10-CM

## 2018-06-29 DIAGNOSIS — I10 Essential (primary) hypertension: Secondary | ICD-10-CM

## 2018-06-29 DIAGNOSIS — H35342 Macular cyst, hole, or pseudohole, left eye: Secondary | ICD-10-CM

## 2018-06-29 DIAGNOSIS — H35033 Hypertensive retinopathy, bilateral: Secondary | ICD-10-CM

## 2018-06-29 DIAGNOSIS — H04123 Dry eye syndrome of bilateral lacrimal glands: Secondary | ICD-10-CM

## 2018-06-29 MED ORDER — PREDNISOLONE ACETATE 1 % OP SUSP: 1.0000 [drp] | Freq: Four times a day (QID) | OPHTHALMIC | 0 refills | Status: DC

## 2018-06-29 MED ORDER — BACITRACIN-POLYMYXIN B 500-10000 UNIT/GM OP OINT: TOPICAL_OINTMENT | Freq: Four times a day (QID) | OPHTHALMIC | 3 refills | Status: AC

## 2018-07-01 ENCOUNTER — Encounter (INDEPENDENT_AMBULATORY_CARE_PROVIDER_SITE_OTHER): Payer: Self-pay | Admitting: Ophthalmology

## 2018-07-16 ENCOUNTER — Other Ambulatory Visit (INDEPENDENT_AMBULATORY_CARE_PROVIDER_SITE_OTHER): Payer: Self-pay

## 2018-07-16 MED ORDER — BRIMONIDINE TARTRATE 0.2 % OP SOLN: 1.0000 [drp] | Freq: Two times a day (BID) | OPHTHALMIC | 1 refills | Status: AC

## 2018-07-17 NOTE — Progress Notes (Signed)
[facility] Note  07/20/2018     CHIEF COMPLAINT Patient presents for Post-op Follow-up   HISTORY OF PRESENT ILLNESS: Dominique Lee is a 68 y.o. female who presents to the clinic today for:   HPI    Post-op Follow-up    In left eye.  Discomfort includes pain.  Negative for itching, foreign body sensation, tearing, discharge, floaters and none.  Vision is improved.  I, the attending physician,  performed the HPI with the patient and updated documentation appropriately.          Comments    Patient states vision improved some OS. Occasional discomfort OS but minimal.        Last edited by Bernarda Caffey, MD on 07/20/2018  1:27 PM. (History)    pt states her vision is getting better, she states things are still not clear, but she can make out forms better than before, she states she can see a line in her vision were the gas bubble is   Referring physician: Geoffery Spruce, FNP No address on file  HISTORICAL INFORMATION:   Selected notes from the [facility] 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 Cotter) [BCVA: OD: 20/40- OS: 20/100-] Ocular Hx-central serous chorioretinopathy, DES PMH-arrthymia, HLD, Rheumatoid Arthritis, Thyroid disease    CURRENT MEDICATIONS: Current Outpatient Medications (Ophthalmic Drugs)  Medication Sig  . bacitracin-polymyxin b (POLYSPORIN) ophthalmic ointment Place into the left eye 4 (four) times daily. Place a 1/2 inch ribbon of ointment into the lower eyelid.  Marland Kitchen brimonidine (ALPHAGAN) 0.2 % ophthalmic solution Place 1 drop into the left eye 2 (two) times daily.  . prednisoLONE acetate (PRED FORTE) 1 % ophthalmic suspension Place 1 drop into the left eye 4 (four) times daily.  Marland Kitchen Propylene Glycol (SYSTANE COMPLETE) 0.6 % SOLN Place 1 drop into both eyes 3 (three) times daily as needed (dry/irritated eyes.).   No current facility-administered  medications for this visit.  (Ophthalmic Drugs)   Current Outpatient Medications (Other)  Medication Sig  . acetaminophen (TYLENOL) 500 MG tablet Take 1,000 mg by mouth at bedtime.  . Cholecalciferol (VITAMIN D-1000 MAX ST) 25 MCG (1000 UT) tablet Take 1,000 Units by mouth daily with lunch.  . cyclobenzaprine (FEXMID) 7.5 MG tablet Take 1 tablet (7.5 mg total) by mouth 3 (three) times daily as needed for muscle spasms.  Marland Kitchen enoxaparin (LOVENOX) 40 MG/0.4ML injection Inject 0.4 mLs (40 mg total) into the skin daily.  . fluticasone (FLONASE) 50 MCG/ACT nasal spray Place 1 spray into both nostrils daily as needed for allergies or rhinitis.  . folic acid (FOLVITE) 1 MG tablet Take 1 mg by mouth daily with lunch.   . ibuprofen (ADVIL,MOTRIN) 200 MG tablet Take 1 tablet (200 mg total) by mouth 3 (three) times daily.  Marland Kitchen leflunomide (ARAVA) 10 MG tablet Take 10 mg by mouth daily with lunch.   . levothyroxine (SYNTHROID, LEVOTHROID) 75 MCG tablet Take 75 mcg by mouth daily before breakfast.   . methotrexate 2.5 MG tablet Take 15 mg by mouth every Thursday. Thursdays with evening meal  . metoprolol succinate (TOPROL-XL) 50 MG 24 hr tablet Take 1-1.5 tablets (50-75 mg total) by mouth daily. (Patient taking differently: Take 50 mg by mouth at bedtime. )  . polyethylene glycol (MIRALAX / GLYCOLAX) packet Take 17 g by mouth daily as needed for mild constipation.  Marland Kitchen omeprazole (PRILOSEC) 20 MG capsule Take 1 capsule (20 mg  total) by mouth daily for 14 days. (Patient not taking: Reported on 06/08/2018)   No current facility-administered medications for this visit.  (Other)      REVIEW OF SYSTEMS: ROS    Positive for: Eyes   Negative for: Constitutional, Gastrointestinal, Neurological, Skin, Genitourinary, Musculoskeletal, HENT, Endocrine, Cardiovascular, Respiratory, Psychiatric, Allergic/Imm, Heme/Lymph   Last edited by Roselee Nova D on 07/20/2018 12:59 PM. (History)       ALLERGIES Allergies   Allergen Reactions  . Codeine Other (See Comments)    Agitation   . Morphine And Related Other (See Comments)    agitation   . Statins Other (See Comments)    Joint  pain    PAST MEDICAL HISTORY Past Medical History:  Diagnosis Date  . Cancer (HCC)    Basal cell- skin  . Complication of anesthesia   . COPD (chronic obstructive pulmonary disease) ([place])   . Dysrhythmia    [name]  . Family history of adverse reaction to anesthesia    Mother N/V  . Fibromyalgia   . High cholesterol   . Hypothyroidism   . Irregular heart beat   . Osteoarthritis   . Pneumothorax after biopsy   . PONV (postoperative nausea and vomiting)   . [name] (premature ventricular contraction)   . RA (rheumatoid arthritis) ([place])   . Thyroid disease    Hypothyroid   Past Surgical History:  Procedure Laterality Date  . [place] VITRECTOMY WITH 20 GAUGE MVR PORT FOR MACULAR HOLE Left 06/21/2018   Procedure: 25 GAUGE PARS PLANA VITRECTOMY WITH 20 GAUGE MVR PORT FOR MACULAR HOLE;  Surgeon: Bernarda Caffey, MD;  Location: [facility];  Service: Ophthalmology;  Laterality: Left;  . ABDOMINAL HYSTERECTOMY  2000  . BREAST SURGERY     saline implants  . CARDIOVASCULAR STRESS TEST  09/02/2005   Cardiolite Myocardial perfusion study; NegativeBruce protocol exercise stress test  . EYE SURGERY Left    macular hole repair  . GAS/FLUID EXCHANGE Left 06/21/2018   Procedure: GAS/FLUID EXCHANGE;  Surgeon: Bernarda Caffey, MD;  Location: [facility];  Service: Ophthalmology;  Laterality: Left;  C3F8  . HIP PINNING,CANNULATED Right 12/18/2017   Procedure: CANNULATED HIP PINNING;  Surgeon: Hiram Gash, MD;  Location: [facility];  Service: Orthopedics;  Laterality: Right;  . LAPAROSCOPIC PARTIAL COLECTOMY Left 05/18/2017  . Lung Biospy    . MEMBRANE PEEL Left 06/21/2018   Procedure: MEMBRANE PEEL;  Surgeon: Bernarda Caffey, MD;  Location: [facility];  Service: Ophthalmology;  Laterality: Left;  . NASAL SINUS SURGERY    . PHOTOCOAGULATION WITH LASER  Left 06/21/2018   Procedure: PHOTOCOAGULATION WITH LASER;  Surgeon: Bernarda Caffey, MD;  Location: [facility];  Service: Ophthalmology;  Laterality: Left;  . TUBAL LIGATION    . US ECHOCARDIOGRAPHY  12/26/2011   mild abnormalities    FAMILY HISTORY Family History  Problem Relation Age of Onset  . Hypertension Mother   . Cancer - Lung Father 59       deceased  . Heart attack Maternal Grandmother   . Cancer Maternal Grandfather   . Diabetes Paternal Grandfather   . Diabetes Brother   . Cancer - Colon Sister     SOCIAL HISTORY Social History   Tobacco Use  . Smoking status: Former Smoker    Types: Cigarettes    Last attempt to quit: 11/04/2002    Years since quitting: 15.7  . Smokeless tobacco: Never Used  Substance Use Topics  . Alcohol use: No  .  Drug use: No         OPHTHALMIC EXAM:  Base Eye Exam    Visual Acuity (Snellen - Linear)      Right Left   Dist cc 20/30 20/150 -1   Dist ph cc 20/20 -1 20/100 -2   Correction:  Glasses       Tonometry (Tonopen, 1:08 PM)      Right Left   Pressure 14 12       Pupils      Dark Light Shape React APD   Right 3 2 Round Brisk None   Left 6 5.5 Round Minimal None       Visual Fields (Counting fingers)      Left Right    Full Full  Can see edges of fingers inferior OS       Extraocular Movement      Right Left    Full, Ortho Full, Ortho       Neuro/Psych    Oriented x3:  Yes   Mood/Affect:  Normal       Dilation    Both eyes:  1.0% Mydriacyl, 2.5% Phenylephrine @ 1:09 PM        Slit Lamp and Fundus Exam    Slit Lamp Exam      Right Left   Lids/Lashes Dermatochalasis - upper lid periorbital edema   Conjunctiva/Sclera White and quiet White and quiet, sutures dissolving   Cornea 1-2+ Punctate epithelial erosions 4+ Punctate epithelial erosions, irregular epi inferiorly, endo pigment inferiorly   Anterior Chamber Deep and clear Deep and quiet   Iris Round and moderately dilated Round and well dilated   Lens 2+  Nuclear sclerosis, 2+ Cortical cataract 2-3+ Nuclear sclerosis, 2+ Cortical cataract   Vitreous Vitreous syneresis, Posterior vitreous detachment Vitreous syneresis, Posterior vitreous detachment, 40% gas fill       Fundus Exam      Right Left   Disc Pink and Sharp Pink and Sharp   C/D Ratio 0.5 0.5   Macula Flat, Blunted foveal reflex, RPE mottling and clumping, No heme or edema flat under gas, mac hole closed, No heme or edema   Vessels mild Copper wiring, Vascular attenuation, mild AV crossing changes Vascular attenuation   Periphery Attached, No RT/RD Attached, good laser 360        Refraction    Wearing Rx      Sphere Cylinder Axis Add   Right -0.75 +1.00 012 +2.25   Left -0.50 +0.75 173 +2.25   Age:  2 yr   Type:  PAL          IMAGING AND PROCEDURES  Imaging and Procedures for _0 @  OCT, Retina - OU - Both Eyes       Right Eye Quality was good. Central Foveal Thickness: 249. Progression has been stable. Findings include normal foveal contour, no SRF, no IRF.   Left Eye Quality was good. Central Foveal Thickness: 318. Progression has improved. Findings include no IRF, no SRF, normal foveal contour (Mac hole closed; Mild ellipsoid thinning).   Notes *Images captured and stored on drive  Diagnosis / Impression:  OD: NFP, No IRF/SRF OS: mac hole closed; NFP; no IRF/SRF; mild ellipsoid thinning centrally   Clinical management:  See below  Abbreviations: NFP - Normal foveal profile. CME - cystoid macular edema. PED - pigment epithelial detachment. IRF - intraretinal fluid. SRF - subretinal fluid. EZ - ellipsoid zone. ERM - epiretinal membrane. ORA - outer retinal atrophy. ORT - outer  retinal tubulation. SRHM - subretinal hyper-reflective material                 ASSESSMENT/PLAN:    ICD-10-CM   1. Macular hole of left eye H35.342   2. Retinal edema H35.81 OCT, Retina - OU - Both Eyes  3. Essential hypertension I10   4. Hypertensive retinopathy of  both eyes H35.033   5. Combined forms of age-related cataract of both eyes H25.813   6. Dry eyes H04.123     1,2. Full Thickness Macular Hole OS - preop BCVA 20/100 w/ central scotoma OS - POW4, s/p PPV/ICG/Membrane peel/C3F8, 01.02.20             - doing well   - gas bubble 40%  - BCVA: 20/100-2  - IOP 12 OS            Dec Brimonidine Qdaily OS            Pred Forte QID OS            PSO ung QID -- okay to stop             - cont face down positioning 50% of the time; avoid laying flat on back             - post op drop and positioning instructions reviewed             - tylenol/ibuprofen for pain - F/U 4-6 week  3,4. Hypertensive retinopathy OU - discussed importance of tight BP control - monitor  5. Mixed for age related cataracts  - The symptoms of cataract, surgical options, and treatments and risks were discussed with patient.  - discussed diagnosis and progression and likelihood of cataract progression OS with PPV  - monitor for now  6. Dry eyes OU - recommend artificial tears and lubricating ointment as needed   Ophthalmic Meds Ordered this visit:  No orders of the defined types were placed in this encounter.      Return for f/u 4-6 weeks mac hole OS, DFE, OCT.  There are no Patient Instructions on file for this visit.   Explained the diagnoses, plan, and follow up with the patient and they expressed understanding.  Patient expressed understanding of the importance of proper follow up care.   This document serves as a record of services personally performed by Gardiner Sleeper, MD, PhD. It was created on their behalf by Ernest Mallick, OA, an ophthalmic assistant. The creation of this record is the provider's dictation and/or activities during the visit.    Electronically signed by: Ernest Mallick, OA  01.28.2020 11:57 PM    Gardiner Sleeper, M.D., Ph.D. Diseases & Surgery of the Retina and Vitreous [facility]  I have reviewed the  above documentation for accuracy and completeness, and I agree with the above. Gardiner Sleeper, M.D., Ph.D. 07/22/18 11:57 PM     Abbreviations: M myopia (nearsighted); A astigmatism; H hyperopia (farsighted); P presbyopia; Mrx spectacle prescription;  CTL contact lenses; OD right eye; OS left eye; OU both eyes  XT exotropia; ET esotropia; PEK punctate epithelial keratitis; PEE punctate epithelial erosions; DES dry eye syndrome; MGD meibomian gland dysfunction; ATs artificial tears; PFAT's preservative free artificial tears; [place] nuclear sclerotic cataract; PSC posterior subcapsular cataract; ERM epi-retinal membrane; PVD posterior vitreous detachment; RD retinal detachment; DM diabetes mellitus; DR diabetic retinopathy; NPDR non-proliferative diabetic retinopathy; PDR proliferative diabetic retinopathy; CSME clinically significant macular edema; DME diabetic macular  edema; dbh dot blot hemorrhages; CWS cotton wool spot; POAG primary open angle glaucoma; C/D cup-to-disc ratio; HVF humphrey visual field; GVF goldmann visual field; OCT optical coherence tomography; IOP intraocular pressure; BRVO Branch retinal vein occlusion; CRVO central retinal vein occlusion; CRAO central retinal artery occlusion; BRAO branch retinal artery occlusion; RT retinal tear; SB scleral buckle; PPV pars plana vitrectomy; VH Vitreous hemorrhage; PRP panretinal laser photocoagulation; IVK intravitreal kenalog; VMT vitreomacular traction; MH Macular hole;  NVD neovascularization of the disc; NVE neovascularization elsewhere; AREDS age related eye disease study; ARMD age related macular degeneration; POAG primary open angle glaucoma; EBMD epithelial/anterior basement membrane dystrophy; ACIOL anterior chamber intraocular lens; IOL intraocular lens; PCIOL posterior chamber intraocular lens; Phaco/IOL phacoemulsification with intraocular lens placement; [place] photorefractive keratectomy; LASIK laser assisted in situ keratomileusis; HTN  hypertension; DM diabetes mellitus; COPD chronic obstructive pulmonary disease

## 2018-07-20 ENCOUNTER — Ambulatory Visit (INDEPENDENT_AMBULATORY_CARE_PROVIDER_SITE_OTHER): Payer: Medicare Other | Admitting: Ophthalmology

## 2018-07-20 ENCOUNTER — Encounter (INDEPENDENT_AMBULATORY_CARE_PROVIDER_SITE_OTHER): Payer: Self-pay | Admitting: Ophthalmology

## 2018-07-20 DIAGNOSIS — H25813 Combined forms of age-related cataract, bilateral: Secondary | ICD-10-CM

## 2018-07-20 DIAGNOSIS — H3581 Retinal edema: Secondary | ICD-10-CM

## 2018-07-20 DIAGNOSIS — H04123 Dry eye syndrome of bilateral lacrimal glands: Secondary | ICD-10-CM

## 2018-07-20 DIAGNOSIS — I10 Essential (primary) hypertension: Secondary | ICD-10-CM

## 2018-07-20 DIAGNOSIS — H35342 Macular cyst, hole, or pseudohole, left eye: Secondary | ICD-10-CM

## 2018-07-20 DIAGNOSIS — H35033 Hypertensive retinopathy, bilateral: Secondary | ICD-10-CM

## 2018-07-22 ENCOUNTER — Encounter (INDEPENDENT_AMBULATORY_CARE_PROVIDER_SITE_OTHER): Payer: Self-pay | Admitting: Ophthalmology

## 2018-07-30 ENCOUNTER — Telehealth (INDEPENDENT_AMBULATORY_CARE_PROVIDER_SITE_OTHER): Payer: Self-pay

## 2018-08-21 NOTE — Progress Notes (Signed)
[facility] Note  08/24/2018     CHIEF COMPLAINT Patient presents for Post-op Follow-up   HISTORY OF PRESENT ILLNESS: Dominique Lee is a 68 y.o. female who presents to the clinic today for:   HPI    Post-op Follow-up    In left eye.  Discomfort includes none.  Vision is improved.  I, the attending physician,  performed the HPI with the patient and updated documentation appropriately.          Comments    68 y/o female pt here for 5 wk po s/p PPV/ICG/membrane peel/C3F8 01.02.20.  Pt doing well.  VA OS continues to gradually improve, but still like "looking through a sheet of paper."  Gas bubble is gone.  No change in [place] OD.  Denies pain.  Had several central flashes OS yesterday around lunchtime that lasted for about 4 minutes.  Has floaters OS as well.  Brimonidine Qdaily OS Pred Forte QID OS PSO ung QHS OS Systane prn OU       Last edited by Bernarda Caffey, MD on 08/26/2018 12:38 AM. (History)    pt states her gas bubble went away last Saturday, she states her eye is still very blurry, she states it's like looking through paper   Referring physician: No referring provider defined for this encounter.  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 Cotter) [BCVA: OD: 20/40- OS: 20/100-] Ocular Hx-central serous chorioretinopathy, DES PMH-arrthymia, HLD, Rheumatoid Arthritis, Thyroid disease    CURRENT MEDICATIONS: Current Outpatient Medications (Ophthalmic Drugs)  Medication Sig  . bacitracin-polymyxin b (POLYSPORIN) ophthalmic ointment Place into the left eye 4 (four) times daily. Place a 1/2 inch ribbon of ointment into the lower eyelid.  Marland Kitchen brimonidine (ALPHAGAN) 0.2 % ophthalmic solution Place 1 drop into the left eye 2 (two) times daily.  . prednisoLONE acetate (PRED FORTE) 1 % ophthalmic suspension Place 1 drop into the  left eye 4 (four) times daily.  Marland Kitchen Propylene Glycol (SYSTANE COMPLETE) 0.6 % SOLN Place 1 drop into both eyes 3 (three) times daily as needed (dry/irritated eyes.).   No current facility-administered medications for this visit.  (Ophthalmic Drugs)   Current Outpatient Medications (Other)  Medication Sig  . acetaminophen (TYLENOL) 500 MG tablet Take 1,000 mg by mouth at bedtime.  . Cholecalciferol (VITAMIN D-1000 MAX ST) 25 MCG (1000 UT) tablet Take 1,000 Units by mouth daily with lunch.  . cyclobenzaprine (FEXMID) 7.5 MG tablet Take 1 tablet (7.5 mg total) by mouth 3 (three) times daily as needed for muscle spasms.  Marland Kitchen enoxaparin (LOVENOX) 40 MG/0.4ML injection Inject 0.4 mLs (40 mg total) into the skin daily.  . fluticasone (FLONASE) 50 MCG/ACT nasal spray Place 1 spray into both nostrils daily as needed for allergies or rhinitis.  . folic acid (FOLVITE) 1 MG tablet Take 1 mg by mouth daily with lunch.   . ibuprofen (ADVIL,MOTRIN) 200 MG tablet Take 1 tablet (200 mg total) by mouth 3 (three) times daily.  Marland Kitchen leflunomide (ARAVA) 10 MG tablet Take 10 mg by mouth daily with lunch.   . levothyroxine (SYNTHROID, LEVOTHROID) 75 MCG tablet Take 75 mcg by mouth daily before breakfast.   . methotrexate 2.5 MG tablet Take 15 mg by mouth every Thursday. Thursdays with evening meal  . metoprolol succinate (TOPROL-XL) 50 MG 24 hr tablet Take 1-1.5 tablets (50-75 mg total) by mouth  daily. (Patient taking differently: Take 50 mg by mouth at bedtime. )  . ondansetron (ZOFRAN) 4 MG tablet   . polyethylene glycol (MIRALAX / GLYCOLAX) packet Take 17 g by mouth daily as needed for mild constipation.  Marland Kitchen omeprazole (PRILOSEC) 20 MG capsule Take 1 capsule (20 mg total) by mouth daily for 14 days. (Patient not taking: Reported on 06/08/2018)   No current facility-administered medications for this visit.  (Other)      REVIEW OF SYSTEMS: ROS    Positive for: Eyes   Negative for: Constitutional, Gastrointestinal,  Neurological, Skin, Genitourinary, Musculoskeletal, HENT, Endocrine, Cardiovascular, Respiratory, Psychiatric, Allergic/Imm, Heme/Lymph   Last edited by Matthew Folks, COA on 08/24/2018  9:59 AM. (History)       ALLERGIES Allergies  Allergen Reactions  . Codeine Other (See Comments)    Agitation   . Morphine And Related Other (See Comments)    agitation   . Statins Other (See Comments)    Joint  pain    PAST MEDICAL HISTORY Past Medical History:  Diagnosis Date  . Cancer (HCC)    Basal cell- skin  . Complication of anesthesia   . COPD (chronic obstructive pulmonary disease) ([place])   . Dysrhythmia    [name]  . Family history of adverse reaction to anesthesia    Mother N/V  . Fibromyalgia   . High cholesterol   . Hypothyroidism   . Irregular heart beat   . Osteoarthritis   . Pneumothorax after biopsy   . PONV (postoperative nausea and vomiting)   . [name] (premature ventricular contraction)   . RA (rheumatoid arthritis) ([place])   . Thyroid disease    Hypothyroid   Past Surgical History:  Procedure Laterality Date  . [place] VITRECTOMY WITH 20 GAUGE MVR PORT FOR MACULAR HOLE Left 06/21/2018   Procedure: 25 GAUGE PARS PLANA VITRECTOMY WITH 20 GAUGE MVR PORT FOR MACULAR HOLE;  Surgeon: Bernarda Caffey, MD;  Location: [facility];  Service: Ophthalmology;  Laterality: Left;  . ABDOMINAL HYSTERECTOMY  2000  . BREAST SURGERY     saline implants  . CARDIOVASCULAR STRESS TEST  09/02/2005   Cardiolite Myocardial perfusion study; NegativeBruce protocol exercise stress test  . EYE SURGERY Left    macular hole repair  . GAS/FLUID EXCHANGE Left 06/21/2018   Procedure: GAS/FLUID EXCHANGE;  Surgeon: Bernarda Caffey, MD;  Location: [facility];  Service: Ophthalmology;  Laterality: Left;  C3F8  . HIP PINNING,CANNULATED Right 12/18/2017   Procedure: CANNULATED HIP PINNING;  Surgeon: Hiram Gash, MD;  Location: [facility];  Service: Orthopedics;  Laterality: Right;  . LAPAROSCOPIC PARTIAL COLECTOMY Left  05/18/2017  . Lung Biospy    . MEMBRANE PEEL Left 06/21/2018   Procedure: MEMBRANE PEEL;  Surgeon: Bernarda Caffey, MD;  Location: [facility];  Service: Ophthalmology;  Laterality: Left;  . NASAL SINUS SURGERY    . PHOTOCOAGULATION WITH LASER Left 06/21/2018   Procedure: PHOTOCOAGULATION WITH LASER;  Surgeon: Bernarda Caffey, MD;  Location: [facility];  Service: Ophthalmology;  Laterality: Left;  . TUBAL LIGATION    . US ECHOCARDIOGRAPHY  12/26/2011   mild abnormalities    FAMILY HISTORY Family History  Problem Relation Age of Onset  . Hypertension Mother   . Cancer - Lung Father 25       deceased  . Heart attack Maternal Grandmother   . Cancer Maternal Grandfather   . Diabetes Paternal Grandfather   . Diabetes Brother   . Cancer - Colon Sister  SOCIAL HISTORY Social History   Tobacco Use  . Smoking status: Former Smoker    Types: Cigarettes    Last attempt to quit: 11/04/2002    Years since quitting: 15.8  . Smokeless tobacco: Never Used  Substance Use Topics  . Alcohol use: No  . Drug use: No         OPHTHALMIC EXAM:  Base Eye Exam    Visual Acuity (Snellen - Linear)      Right Left   Dist cc 20/30 -2 20/200   Dist ph cc 20/20 -2 20/150 +   Correction:  Glasses       Tonometry (Tonopen, 10:18 AM)      Right Left   Pressure 12 12       Pupils      Dark Light Shape React APD   Right 3 2 Round Brisk None   Left 5 5 Round Minimal None       Visual Fields (Counting fingers)      Left Right    Full Full       Extraocular Movement      Right Left    Full, Ortho Full, Ortho       Neuro/Psych    Oriented x3:  Yes   Mood/Affect:  Normal       Dilation    Both eyes:  1.0% Mydriacyl, 2.5% Phenylephrine @ 10:18 AM        Slit Lamp and Fundus Exam    Slit Lamp Exam      Right Left   Lids/Lashes Dermatochalasis - upper lid Dermatochalasis - upper lid, Meibomian gland dysfunction   Conjunctiva/Sclera White and quiet White and quiet, sutures dissolving   Cornea  2+ Punctate epithelial erosions inferiorly arcus, 3+ diffuse Punctate epithelial erosions, irregular epi inferiorly, endo pigment inferiorly   Anterior Chamber Deep and clear Deep and quiet, 1/2+ cell/pigment   Iris Round and dilated Round and well dilated   Lens 2+ Nuclear sclerosis, 2+ Cortical cataract 3+ Nuclear sclerosis with brunescence, 2+ Cortical cataract   Vitreous Vitreous syneresis, Posterior vitreous detachment post vitrectomy; gas bubble gone       Fundus Exam      Right Left   Disc Pink and Sharp Pink and Sharp   C/D Ratio 0.5 0.5   Macula Flat, Blunted foveal reflex, RPE mottling and clumping, No heme or edema flat; mac hole closed, mild Retinal pigment epithelial mottling, No heme or edema   Vessels mild Copper wiring, Vascular attenuation, mild AV crossing changes Vascular attenuation, Tortuousity   Periphery Attached, No RT/RD Attached, good laser 360          IMAGING AND PROCEDURES  Imaging and Procedures for @TODAY @  OCT, Retina - OU - Both Eyes       Right Eye Quality was good. Central Foveal Thickness: 251. Progression has been stable. Findings include normal foveal contour, no SRF, no IRF (Focal ellipsoid disruption superior to fovea).   Left Eye Quality was borderline. Central Foveal Thickness: 313. Progression has improved. Findings include no IRF, no SRF, normal foveal contour (Mac hole closed; interval improvement in ellipsoid signal).   Notes *Images captured and stored on drive  Diagnosis / Impression:  OD: NFP, No IRF/SRF OS: mac hole closed; NFP; no IRF/SRF; interval improvement in ellipsoid signal   Clinical management:  See below  Abbreviations: NFP - Normal foveal profile. CME - cystoid macular edema. PED - pigment epithelial detachment. IRF - intraretinal fluid. SRF - subretinal  fluid. EZ - ellipsoid zone. ERM - epiretinal membrane. ORA - outer retinal atrophy. ORT - outer retinal tubulation. SRHM - subretinal hyper-reflective  material                 ASSESSMENT/PLAN:    ICD-10-CM   1. Macular hole of left eye H35.342   2. Retinal edema H35.81 OCT, Retina - OU - Both Eyes  3. Essential hypertension I10   4. Hypertensive retinopathy of both eyes H35.033   5. Combined forms of age-related cataract of both eyes H25.813   6. Dry eyes H04.123     1,2. Full Thickness Macular Hole OS - preop BCVA 20/100 w/ central scotoma OS - POW8, s/p PPV/ICG/Membrane peel/C3F8, 01.02.20             - doing well   - gas bubble gone  - BCVA: 20/150+ -- vision limited by 3+ PEE and cataract  - IOP 12 OS            Brimonidine Qdaily OS - okay to stop            Start PF taper -- 4,3,2,1 drops daily, decrease weekly  - post op drop instructions reviewed             - tylenol/ibuprofen for pain - F/U 4 week  3,4. Hypertensive retinopathy OU - discussed importance of tight BP control - monitor  5. Mixed form age related cataracts OU (OS>OD)  - The symptoms of cataract, surgical options, and treatments and risks were discussed with patient.  - discussed diagnosis and progression and likelihood of cataract progression OS with PPV  - now clear from a retina standpoint to pursue cataract surgery OS  - will refer to Dr. Midge Aver for consultation -- pt states mother was a pt of Dr. Loletha Grayer. [facility]  6. Dry eyes OU - recommend artificial tears and lubricating ointment as needed   Ophthalmic Meds Ordered this visit:  No orders of the defined types were placed in this encounter.      Return in about 4 weeks (around 09/21/2018) for f/u [place] OS, DFE, OCT.  There are no Patient Instructions on file for this visit.   Explained the diagnoses, plan, and follow up with the patient and they expressed understanding.  Patient expressed understanding of the importance of proper follow up care.   This document serves as a record of services personally performed by Gardiner Sleeper, MD, PhD. It was created on their behalf by  Ernest Mallick, OA, an ophthalmic assistant. The creation of this record is the provider's dictation and/or activities during the visit.    Electronically signed by: Ernest Mallick, OA  03.03.2020 12:42 AM    Gardiner Sleeper, M.D., Ph.D. Diseases & Surgery of the Retina and Vitreous [facility]  I have reviewed the above documentation for accuracy and completeness, and I agree with the above. Gardiner Sleeper, M.D., Ph.D. 08/26/18 12:44 AM    Abbreviations: M myopia (nearsighted); A astigmatism; H hyperopia (farsighted); P presbyopia; Mrx spectacle prescription;  CTL contact lenses; OD right eye; OS left eye; OU both eyes  XT exotropia; ET esotropia; PEK punctate epithelial keratitis; PEE punctate epithelial erosions; DES dry eye syndrome; MGD meibomian gland dysfunction; ATs artificial tears; PFAT's preservative free artificial tears; [place] nuclear sclerotic cataract; PSC posterior subcapsular cataract; ERM epi-retinal membrane; PVD posterior vitreous detachment; RD retinal detachment; DM diabetes mellitus; DR diabetic retinopathy; NPDR non-proliferative diabetic retinopathy; PDR proliferative diabetic  retinopathy; CSME clinically significant macular edema; DME diabetic macular edema; dbh dot blot hemorrhages; CWS cotton wool spot; POAG primary open angle glaucoma; C/D cup-to-disc ratio; HVF humphrey visual field; GVF goldmann visual field; OCT optical coherence tomography; IOP intraocular pressure; BRVO Branch retinal vein occlusion; CRVO central retinal vein occlusion; CRAO central retinal artery occlusion; BRAO branch retinal artery occlusion; RT retinal tear; SB scleral buckle; PPV pars plana vitrectomy; VH Vitreous hemorrhage; PRP panretinal laser photocoagulation; IVK intravitreal kenalog; VMT vitreomacular traction; MH Macular hole;  NVD neovascularization of the disc; NVE neovascularization elsewhere; AREDS age related eye disease study; ARMD age related macular degeneration;  POAG primary open angle glaucoma; EBMD epithelial/anterior basement membrane dystrophy; ACIOL anterior chamber intraocular lens; IOL intraocular lens; PCIOL posterior chamber intraocular lens; Phaco/IOL phacoemulsification with intraocular lens placement; [place] photorefractive keratectomy; LASIK laser assisted in situ keratomileusis; HTN hypertension; DM diabetes mellitus; COPD chronic obstructive pulmonary disease

## 2018-08-24 ENCOUNTER — Encounter (INDEPENDENT_AMBULATORY_CARE_PROVIDER_SITE_OTHER): Payer: Self-pay | Admitting: Ophthalmology

## 2018-08-24 ENCOUNTER — Ambulatory Visit (INDEPENDENT_AMBULATORY_CARE_PROVIDER_SITE_OTHER): Payer: Medicare Other | Admitting: Ophthalmology

## 2018-08-24 DIAGNOSIS — H3581 Retinal edema: Secondary | ICD-10-CM | POA: Diagnosis not present

## 2018-08-24 DIAGNOSIS — I10 Essential (primary) hypertension: Secondary | ICD-10-CM

## 2018-08-24 DIAGNOSIS — H04123 Dry eye syndrome of bilateral lacrimal glands: Secondary | ICD-10-CM

## 2018-08-24 DIAGNOSIS — H25813 Combined forms of age-related cataract, bilateral: Secondary | ICD-10-CM

## 2018-08-24 DIAGNOSIS — H35342 Macular cyst, hole, or pseudohole, left eye: Secondary | ICD-10-CM

## 2018-08-24 DIAGNOSIS — H35033 Hypertensive retinopathy, bilateral: Secondary | ICD-10-CM

## 2018-08-26 ENCOUNTER — Encounter (INDEPENDENT_AMBULATORY_CARE_PROVIDER_SITE_OTHER): Payer: Self-pay | Admitting: Ophthalmology

## 2018-09-24 NOTE — Progress Notes (Signed)
[facility] Note  09/26/2018     CHIEF COMPLAINT Patient presents for Retina Follow Up   HISTORY OF PRESENT ILLNESS: Dominique Lee is a 68 y.o. female who presents to the clinic today for:   HPI    Retina Follow Up    Patient presents with  Other.  In left eye.  Severity is moderate.  Duration of 4 weeks.  Since onset it is stable.  I, the attending physician,  performed the HPI with the patient and updated documentation appropriately.          Comments    Retina follow up for s/p ppv/membrane peel(06/21/18). Patient states vision looks gray in os. Patient was unable to get Cat Surgery has been postponed at this time.       Last edited by Bernarda Caffey, MD on 09/26/2018 10:23 AM. (History)    pt states she was supposed to have cataract sx on March 19th with Dr. Katy Fitch, but they called on the 18th and cancelled bc of COVID-19, pt states she is off all rx drops, but is using systane ointment at night, pt states she sees flashing lights in her central vision almost every day   Referring physician: No referring provider defined for this encounter.  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 Cotter) [BCVA: OD: 20/40- OS: 20/100-] Ocular Hx-central serous chorioretinopathy, DES PMH-arrthymia, HLD, Rheumatoid Arthritis, Thyroid disease    CURRENT MEDICATIONS: Current Outpatient Medications (Ophthalmic Drugs)  Medication Sig  . Propylene Glycol (SYSTANE COMPLETE) 0.6 % SOLN Place 1 drop into both eyes 3 (three) times daily as needed (dry/irritated eyes.).  Marland Kitchen bacitracin-polymyxin b (POLYSPORIN) ophthalmic ointment Place into the left eye 4 (four) times daily. Place a 1/2 inch ribbon of ointment into the lower eyelid.  Marland Kitchen brimonidine (ALPHAGAN) 0.2 % ophthalmic solution Place 1 drop into the left eye 2 (two) times daily.  .  prednisoLONE acetate (PRED FORTE) 1 % ophthalmic suspension Place 1 drop into the left eye 4 (four) times daily.   No current facility-administered medications for this visit.  (Ophthalmic Drugs)   Current Outpatient Medications (Other)  Medication Sig  . acetaminophen (TYLENOL) 500 MG tablet Take 1,000 mg by mouth at bedtime.  . Cholecalciferol (VITAMIN D-1000 MAX ST) 25 MCG (1000 UT) tablet Take 1,000 Units by mouth daily with lunch.  . cyclobenzaprine (FEXMID) 7.5 MG tablet Take 1 tablet (7.5 mg total) by mouth 3 (three) times daily as needed for muscle spasms.  Marland Kitchen enoxaparin (LOVENOX) 40 MG/0.4ML injection Inject 0.4 mLs (40 mg total) into the skin daily.  . fluticasone (FLONASE) 50 MCG/ACT nasal spray Place 1 spray into both nostrils daily as needed for allergies or rhinitis.  . folic acid (FOLVITE) 1 MG tablet Take 1 mg by mouth daily with lunch.   . ibuprofen (ADVIL,MOTRIN) 200 MG tablet Take 1 tablet (200 mg total) by mouth 3 (three) times daily.  Marland Kitchen leflunomide (ARAVA) 10 MG tablet Take 10 mg by mouth daily with lunch.   . levothyroxine (SYNTHROID, LEVOTHROID) 75 MCG tablet Take 75 mcg by mouth daily before breakfast.   . methotrexate 2.5 MG tablet Take 15 mg by mouth every Thursday. Thursdays with evening meal  . metoprolol succinate (TOPROL-XL) 50 MG 24 hr tablet Take 1-1.5 tablets (50-75 mg total) by mouth daily. (Patient taking differently: Take 50 mg by mouth at bedtime. )  .  ondansetron (ZOFRAN) 4 MG tablet   . polyethylene glycol (MIRALAX / GLYCOLAX) packet Take 17 g by mouth daily as needed for mild constipation.  Marland Kitchen omeprazole (PRILOSEC) 20 MG capsule Take 1 capsule (20 mg total) by mouth daily for 14 days. (Patient not taking: Reported on 06/08/2018)   No current facility-administered medications for this visit.  (Other)      REVIEW OF SYSTEMS: ROS    Positive for: Eyes   Negative for: Constitutional, Gastrointestinal, Neurological, Skin, Genitourinary, Musculoskeletal,  HENT, Endocrine, Cardiovascular, Respiratory, Psychiatric, Allergic/Imm, Heme/Lymph   Last edited by Elmore Guise on 09/26/2018  9:50 AM. (History)       ALLERGIES Allergies  Allergen Reactions  . Codeine Other (See Comments)    Agitation   . Morphine And Related Other (See Comments)    agitation   . Statins Other (See Comments)    Joint  pain    PAST MEDICAL HISTORY Past Medical History:  Diagnosis Date  . Cancer (HCC)    Basal cell- skin  . Complication of anesthesia   . COPD (chronic obstructive pulmonary disease) ([place])   . Dysrhythmia    [name]  . Family history of adverse reaction to anesthesia    Mother N/V  . Fibromyalgia   . High cholesterol   . Hypothyroidism   . Irregular heart beat   . Osteoarthritis   . Pneumothorax after biopsy   . PONV (postoperative nausea and vomiting)   . [name] (premature ventricular contraction)   . RA (rheumatoid arthritis) ([place])   . Thyroid disease    Hypothyroid   Past Surgical History:  Procedure Laterality Date  . [place] VITRECTOMY WITH 20 GAUGE MVR PORT FOR MACULAR HOLE Left 06/21/2018   Procedure: 25 GAUGE PARS PLANA VITRECTOMY WITH 20 GAUGE MVR PORT FOR MACULAR HOLE;  Surgeon: Bernarda Caffey, MD;  Location: [place];  Service: Ophthalmology;  Laterality: Left;  . ABDOMINAL HYSTERECTOMY  2000  . BREAST SURGERY     saline implants  . CARDIOVASCULAR STRESS TEST  09/02/2005   Cardiolite Myocardial perfusion study; NegativeBruce protocol exercise stress test  . EYE SURGERY Left    macular hole repair  . GAS/FLUID EXCHANGE Left 06/21/2018   Procedure: GAS/FLUID EXCHANGE;  Surgeon: Bernarda Caffey, MD;  Location: [place];  Service: Ophthalmology;  Laterality: Left;  C3F8  . HIP PINNING,CANNULATED Right 12/18/2017   Procedure: CANNULATED HIP PINNING;  Surgeon: Hiram Gash, MD;  Location: [place];  Service: Orthopedics;  Laterality: Right;  . LAPAROSCOPIC PARTIAL COLECTOMY Left 05/18/2017  . Lung Biospy    . MEMBRANE PEEL Left  06/21/2018   Procedure: MEMBRANE PEEL;  Surgeon: Bernarda Caffey, MD;  Location: [place];  Service: Ophthalmology;  Laterality: Left;  . NASAL SINUS SURGERY    . PHOTOCOAGULATION WITH LASER Left 06/21/2018   Procedure: PHOTOCOAGULATION WITH LASER;  Surgeon: Bernarda Caffey, MD;  Location: [place];  Service: Ophthalmology;  Laterality: Left;  . TUBAL LIGATION    . US ECHOCARDIOGRAPHY  12/26/2011   mild abnormalities    FAMILY HISTORY Family History  Problem Relation Age of Onset  . Hypertension Mother   . Cancer - Lung Father 79       deceased  . Heart attack Maternal Grandmother   . Cancer Maternal Grandfather   . Diabetes Paternal Grandfather   . Diabetes Brother   . Cancer - Colon Sister     SOCIAL HISTORY Social History   Tobacco Use  . Smoking status: Former  Smoker    Types: Cigarettes    Last attempt to quit: 11/04/2002    Years since quitting: 15.9  . Smokeless tobacco: Never Used  Substance Use Topics  . Alcohol use: No  . Drug use: No         OPHTHALMIC EXAM:  Base Eye Exam    Visual Acuity (Snellen - Linear)      Right Left   Dist cc 20/25-2 20/200   Dist ph cc 20/NI 20/50   Correction:  Glasses       Tonometry (Tonopen, 9:52 AM)      Right Left   Pressure 13 12       Pupils      Dark Light Shape React APD   Right 3 2 Round Brisk None   Left 4 3 Round Brisk None       Visual Fields (Counting fingers)      Left Right    Full Full       Extraocular Movement      Right Left    Full, Ortho Full, Ortho       Neuro/Psych    Oriented x3:  Yes   Mood/Affect:  Normal       Dilation    Both eyes:  1.0% Mydriacyl, 2.5% Phenylephrine @ 9:52 AM        Slit Lamp and Fundus Exam    Slit Lamp Exam      Right Left   Lids/Lashes Dermatochalasis - upper lid Dermatochalasis - upper lid, Meibomian gland dysfunction   Conjunctiva/Sclera White and quiet White and quiet, sutures dissolving   Cornea 1+ Punctate epithelial erosions inferiorly arcus, 3+ diffuse  Punctate epithelial erosions, irregular epi inferiorly, endo pigment inferiorly   Anterior Chamber Deep and clear Deep and quiet, 1/2+ cell/pigment   Iris Round and dilated Round and well dilated   Lens 2+ Nuclear sclerosis, 2+ Cortical cataract 3+ Nuclear sclerosis with brunescence, 2+ Cortical cataract   Vitreous Vitreous syneresis, Posterior vitreous detachment post vitrectomy; gas bubble gone       Fundus Exam      Right Left   Disc Pink and Sharp Pink and Sharp   C/D Ratio 0.5 0.5   Macula Flat, Blunted foveal reflex, RPE mottling and clumping, No heme or edema flat; mac hole closed, trace Retinal pigment epithelial mottling, No heme or edema   Vessels mild Copper wiring, Vascular attenuation, mild AV crossing changes Vascular attenuation, Tortuousity   Periphery Attached, No RT/RD Attached, good laser 360        Refraction    Wearing Rx      Sphere Cylinder Axis Add   Right -0.75 +1.00 012 +2.25   Left -0.50 +0.75 173 +2.25   Type:  PAL          IMAGING AND PROCEDURES  Imaging and Procedures for _0 @  OCT, Retina - OU - Both Eyes       Right Eye Quality was good. Central Foveal Thickness: 251. Progression has been stable. Findings include normal foveal contour, no SRF, no IRF (Focal ellipsoid disruption superior to fovea).   Left Eye Quality was borderline. Central Foveal Thickness: 312. Progression has improved. Findings include no IRF, no SRF, normal foveal contour (Mac hole closed; interval improvement in ellipsoid signal ).   Notes *Images captured and stored on drive  Diagnosis / Impression:  OD: NFP, No IRF/SRF OS: mac hole closed; NFP; no IRF/SRF; interval improvement in ellipsoid signal   Clinical management:  See below  Abbreviations: NFP - Normal foveal profile. CME - cystoid macular edema. PED - pigment epithelial detachment. IRF - intraretinal fluid. SRF - subretinal fluid. EZ - ellipsoid zone. ERM - epiretinal membrane. ORA - outer retinal  atrophy. ORT - outer retinal tubulation. SRHM - subretinal hyper-reflective material                 ASSESSMENT/PLAN:    ICD-10-CM   1. Macular hole of left eye H35.342   2. Retinal edema H35.81 OCT, Retina - OU - Both Eyes  3. Essential hypertension I10   4. Hypertensive retinopathy of both eyes H35.033   5. Combined forms of age-related cataract of both eyes H25.813   6. Dry eyes H04.123     1,2. Full Thickness Macular Hole OS - preop BCVA 20/100 w/ central scotoma OS - POM3, s/p PPV/ICG/Membrane peel/C3F8, 01.02.20 - doing well with macular hole closed and ellipsoid signal improving - BCVA: 20/50+ -- vision limited by 3+ PEE and cataract - IOP 12 OS - F/U 4-5 months  3,4. Hypertensive retinopathy OU - discussed importance of tight BP control - monitor  5. Mixed form age related cataracts OU (OS>OD)  - The symptoms of cataract, surgical options, and treatments and risks were discussed with patient.  - discussed diagnosis and progression and likelihood of cataract progression OS with PPV  - now clear from a retina standpoint to pursue cataract surgery OS  - referred to Dr. Midge Aver as pt's mother was a pt of his  - surgery was cancelled due to COVID-19 restrictions  - remains pending  6. Dry eyes OU - recommend artificial tears and lubricating ointment as needed   Ophthalmic Meds Ordered this visit:  No orders of the defined types were placed in this encounter.      Return for f/u 4-5 months, mac hole OS, DFE, OCT.  There are no Patient Instructions on file for this visit.   Explained the diagnoses, plan, and follow up with the patient and they expressed understanding.  Patient expressed understanding of the importance of proper follow up care.   This document serves as a record of services personally performed by Gardiner Sleeper, MD, PhD. It was created on their behalf by Ernest Mallick, OA, an ophthalmic assistant. The creation of this record is the  provider's dictation and/or activities during the visit.    Electronically signed by: Ernest Mallick, OA  04.06.2020 12:27 PM    Gardiner Sleeper, M.D., Ph.D. Diseases & Surgery of the Retina and Vitreous [facility]  I have reviewed the above documentation for accuracy and completeness, and I agree with the above. Gardiner Sleeper, M.D., Ph.D. 09/26/18 12:31 PM    Abbreviations: M myopia (nearsighted); A astigmatism; H hyperopia (farsighted); P presbyopia; Mrx spectacle prescription;  CTL contact lenses; OD right eye; OS left eye; OU both eyes  XT exotropia; ET esotropia; PEK punctate epithelial keratitis; PEE punctate epithelial erosions; DES dry eye syndrome; MGD meibomian gland dysfunction; ATs artificial tears; PFAT's preservative free artificial tears; [place] nuclear sclerotic cataract; PSC posterior subcapsular cataract; ERM epi-retinal membrane; PVD posterior vitreous detachment; RD retinal detachment; DM diabetes mellitus; DR diabetic retinopathy; NPDR non-proliferative diabetic retinopathy; PDR proliferative diabetic retinopathy; CSME clinically significant macular edema; DME diabetic macular edema; dbh dot blot hemorrhages; CWS cotton wool spot; POAG primary open angle glaucoma; C/D cup-to-disc ratio; HVF humphrey visual field; GVF goldmann visual field; OCT optical coherence tomography; IOP intraocular pressure; BRVO Branch  retinal vein occlusion; CRVO central retinal vein occlusion; CRAO central retinal artery occlusion; BRAO branch retinal artery occlusion; RT retinal tear; SB scleral buckle; PPV pars plana vitrectomy; VH Vitreous hemorrhage; PRP panretinal laser photocoagulation; IVK intravitreal kenalog; VMT vitreomacular traction; MH Macular hole;  NVD neovascularization of the disc; NVE neovascularization elsewhere; AREDS age related eye disease study; ARMD age related macular degeneration; POAG primary open angle glaucoma; EBMD epithelial/anterior basement membrane  dystrophy; ACIOL anterior chamber intraocular lens; IOL intraocular lens; PCIOL posterior chamber intraocular lens; Phaco/IOL phacoemulsification with intraocular lens placement; [place] photorefractive keratectomy; LASIK laser assisted in situ keratomileusis; HTN hypertension; DM diabetes mellitus; COPD chronic obstructive pulmonary disease

## 2018-09-26 ENCOUNTER — Encounter (INDEPENDENT_AMBULATORY_CARE_PROVIDER_SITE_OTHER): Payer: Self-pay | Admitting: Ophthalmology

## 2018-09-26 ENCOUNTER — Ambulatory Visit (INDEPENDENT_AMBULATORY_CARE_PROVIDER_SITE_OTHER): Payer: Medicare Other | Admitting: Ophthalmology

## 2018-09-26 ENCOUNTER — Other Ambulatory Visit: Payer: Self-pay

## 2018-09-26 DIAGNOSIS — H35033 Hypertensive retinopathy, bilateral: Secondary | ICD-10-CM | POA: Diagnosis not present

## 2018-09-26 DIAGNOSIS — H04123 Dry eye syndrome of bilateral lacrimal glands: Secondary | ICD-10-CM

## 2018-09-26 DIAGNOSIS — H35342 Macular cyst, hole, or pseudohole, left eye: Secondary | ICD-10-CM

## 2018-09-26 DIAGNOSIS — H3581 Retinal edema: Secondary | ICD-10-CM | POA: Diagnosis not present

## 2018-09-26 DIAGNOSIS — H25813 Combined forms of age-related cataract, bilateral: Secondary | ICD-10-CM

## 2018-09-26 DIAGNOSIS — I10 Essential (primary) hypertension: Secondary | ICD-10-CM

## 2018-11-19 HISTORY — PX: CATARACT EXTRACTION: SUR2

## 2019-01-27 NOTE — Progress Notes (Addendum)
[facility] Note  01/28/2019     CHIEF COMPLAINT Patient presents for Retina Follow Up   HISTORY OF PRESENT ILLNESS: Dominique Lee is a 68 y.o. female who presents to the clinic today for:   HPI    Retina Follow Up    Patient presents with  Other.  In left eye.  This started 4 months ago.  Severity is moderate.  I, the attending physician,  performed the HPI with the patient and updated documentation appropriately.          Comments    Patient here for 4 months retina follow up for [facility] ( s/p PPV + MP 01.02.20). Patient states vision doing so, so. OS still has distortion, depth perception and light sensitive. Vision fairly good. Still wavy in os. Had cataract surgery May 2020. Has new glasses.       Last edited by Bernarda Caffey, MD on 01/28/2019  8:30 AM. (History)    pt states she had cataract sx on her left eye with Dr. Midge Aver, she states she is done with all post op drops, she states she noticed a change in her vision after cataract sx, she states she still has light sensitivity and problems with depth perception  Referring physician: Warden Fillers, [address] [place],  [place] [id number]  HISTORICAL INFORMATION:   Selected notes from the MEDICAL RECORD NUMBER 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 Cotter) [BCVA: OD: 20/40- OS: 20/100-] Ocular Hx-central serous chorioretinopathy, DES PMH-arrthymia, HLD, Rheumatoid Arthritis, Thyroid disease    CURRENT MEDICATIONS: Current Outpatient Medications (Ophthalmic Drugs)  Medication Sig  . bacitracin-polymyxin b (POLYSPORIN) ophthalmic ointment Place into the left eye 4 (four) times daily. Place a 1/2 inch ribbon of ointment into the lower eyelid.  Marland Kitchen brimonidine (ALPHAGAN) 0.2 % ophthalmic solution Place 1 drop into the left eye 2 (two) times daily.  Marland Kitchen ketorolac (ACULAR) 0.5 % ophthalmic solution Place 1 drop into the left  eye 4 (four) times daily.  . prednisoLONE acetate (PRED FORTE) 1 % ophthalmic suspension Place 1 drop into the left eye 4 (four) times daily.  Marland Kitchen Propylene Glycol (SYSTANE COMPLETE) 0.6 % SOLN Place 1 drop into both eyes 3 (three) times daily as needed (dry/irritated eyes.).   No current facility-administered medications for this visit.  (Ophthalmic Drugs)   Current Outpatient Medications (Other)  Medication Sig  . acetaminophen (TYLENOL) 500 MG tablet Take 1,000 mg by mouth at bedtime.  . Cholecalciferol (VITAMIN D-1000 MAX ST) 25 MCG (1000 UT) tablet Take 1,000 Units by mouth daily with lunch.  . cyclobenzaprine (FEXMID) 7.5 MG tablet Take 1 tablet (7.5 mg total) by mouth 3 (three) times daily as needed for muscle spasms.  Marland Kitchen enoxaparin (LOVENOX) 40 MG/0.4ML injection Inject 0.4 mLs (40 mg total) into the skin daily.  . fluticasone (FLONASE) 50 MCG/ACT nasal spray Place 1 spray into both nostrils daily as needed for allergies or rhinitis.  . folic acid (FOLVITE) 1 MG tablet Take 1 mg by mouth daily with lunch.   . ibuprofen (ADVIL,MOTRIN) 200 MG tablet Take 1 tablet (200 mg total) by mouth 3 (three) times daily.  Marland Kitchen leflunomide (ARAVA) 10 MG tablet Take 10 mg by mouth daily with lunch.   . levothyroxine (SYNTHROID, LEVOTHROID) 75 MCG tablet Take 75 mcg by mouth daily before breakfast.   . methotrexate 2.5 MG tablet Take 15 mg by mouth every Thursday.  Thursdays with evening meal  . metoprolol succinate (TOPROL-XL) 50 MG 24 hr tablet Take 1-1.5 tablets (50-75 mg total) by mouth daily. (Patient taking differently: Take 50 mg by mouth at bedtime. )  . omeprazole (PRILOSEC) 20 MG capsule Take 1 capsule (20 mg total) by mouth daily for 14 days. (Patient not taking: Reported on 06/08/2018)  . ondansetron (ZOFRAN) 4 MG tablet   . polyethylene glycol (MIRALAX / GLYCOLAX) packet Take 17 g by mouth daily as needed for mild constipation.   No current facility-administered medications for this visit.   (Other)      REVIEW OF SYSTEMS: ROS    Positive for: Eyes   Negative for: Constitutional, Gastrointestinal, Neurological, Skin, Genitourinary, Musculoskeletal, HENT, Endocrine, Cardiovascular, Respiratory, Psychiatric, Allergic/Imm, Heme/Lymph   Last edited by Theodore Demark on 01/28/2019  8:06 AM. (History)       ALLERGIES Allergies  Allergen Reactions  . Codeine Other (See Comments)    Agitation   . Morphine And Related Other (See Comments)    agitation   . Statins Other (See Comments)    Joint  pain    PAST MEDICAL HISTORY Past Medical History:  Diagnosis Date  . Cancer (HCC)    Basal cell- skin  . Complication of anesthesia   . COPD (chronic obstructive pulmonary disease) ([place])   . Dysrhythmia    [name]  . Family history of adverse reaction to anesthesia    Mother N/V  . Fibromyalgia   . High cholesterol   . Hypothyroidism   . Irregular heart beat   . Osteoarthritis   . Pneumothorax after biopsy   . PONV (postoperative nausea and vomiting)   . [name] (premature ventricular contraction)   . RA (rheumatoid arthritis) ([place])   . Thyroid disease    Hypothyroid   Past Surgical History:  Procedure Laterality Date  . [place] VITRECTOMY WITH 20 GAUGE MVR PORT FOR MACULAR HOLE Left 06/21/2018   Procedure: 25 GAUGE PARS PLANA VITRECTOMY WITH 20 GAUGE MVR PORT FOR MACULAR HOLE;  Surgeon: Bernarda Caffey, MD;  Location: [place];  Service: Ophthalmology;  Laterality: Left;  . ABDOMINAL HYSTERECTOMY  2000  . BREAST SURGERY     saline implants  . CARDIOVASCULAR STRESS TEST  09/02/2005   Cardiolite Myocardial perfusion study; NegativeBruce protocol exercise stress test  . EYE SURGERY Left    macular hole repair  . GAS/FLUID EXCHANGE Left 06/21/2018   Procedure: GAS/FLUID EXCHANGE;  Surgeon: Bernarda Caffey, MD;  Location: [place];  Service: Ophthalmology;  Laterality: Left;  C3F8  . HIP PINNING,CANNULATED Right 12/18/2017   Procedure: CANNULATED HIP PINNING;  Surgeon:  Hiram Gash, MD;  Location: [place];  Service: Orthopedics;  Laterality: Right;  . LAPAROSCOPIC PARTIAL COLECTOMY Left 05/18/2017  . Lung Biospy    . MEMBRANE PEEL Left 06/21/2018   Procedure: MEMBRANE PEEL;  Surgeon: Bernarda Caffey, MD;  Location: [place];  Service: Ophthalmology;  Laterality: Left;  . NASAL SINUS SURGERY    . PHOTOCOAGULATION WITH LASER Left 06/21/2018   Procedure: PHOTOCOAGULATION WITH LASER;  Surgeon: Bernarda Caffey, MD;  Location: [place];  Service: Ophthalmology;  Laterality: Left;  . TUBAL LIGATION    . US ECHOCARDIOGRAPHY  12/26/2011   mild abnormalities    FAMILY HISTORY Family History  Problem Relation Age of Onset  . Hypertension Mother   . Cancer - Lung Father 67       deceased  . Heart attack Maternal Grandmother   . Cancer Maternal  Grandfather   . Diabetes Paternal Grandfather   . Diabetes Brother   . Cancer - Colon Sister     SOCIAL HISTORY Social History   Tobacco Use  . Smoking status: Former Smoker    Types: Cigarettes    Quit date: 11/04/2002    Years since quitting: 16.2  . Smokeless tobacco: Never Used  Substance Use Topics  . Alcohol use: No  . Drug use: No         OPHTHALMIC EXAM:  Base Eye Exam    Visual Acuity (Snellen - Linear)      Right Left   Dist cc 20/20 -2 20/40 +2   Correction: Glasses       Tonometry (Tonopen, 8:03 AM)      Right Left   Pressure 11 6       Pupils      Dark Light Shape React APD   Right 3 2 Round Brisk None   Left 4 3 Round Brisk None       Visual Fields (Counting fingers)      Left Right    Full Full       Extraocular Movement      Right Left    Full, Ortho Full, Ortho       Neuro/Psych    Oriented x3: Yes   Mood/Affect: Normal       Dilation    Both eyes: 1.0% Mydriacyl, 2.5% Phenylephrine @ 8:03 AM        Slit Lamp and Fundus Exam    Slit Lamp Exam      Right Left   Lids/Lashes Dermatochalasis - upper lid Dermatochalasis - upper lid, Meibomian gland dysfunction    Conjunctiva/Sclera White and quiet White and quiet   Cornea 1+ Punctate epithelial erosions inferiorly arcus, 3-4+ diffuse Punctate epithelial erosions, irregular epi inferiorly, endo pigment inferiorly, Well healed cataract wounds   Anterior Chamber Deep and clear Deep, 2+cell/pigment   Iris Round and dilated Round and well dilated   Lens 2+ Nuclear sclerosis, 2+ Cortical cataract PC IOL in excellent position   Vitreous Vitreous syneresis, Posterior vitreous detachment post vitrectomy; trace cell/pigmenyt       Fundus Exam      Right Left   Disc Pink and Sharp Pink and Sharp   C/D Ratio 0.5 0.5   Macula Flat, Blunted foveal reflex, RPE mottling and clumping, No heme or edema flat; mac hole closed, trace cystic changes temporal fovea, trace Retinal pigment epithelial mottling, No heme or edema   Vessels Vascular attenuation, AV crossing changes Vascular attenuation, AV crossing changes   Periphery Attached, No RT/RD Attached, good laser 360        Refraction    Wearing Rx      Sphere Cylinder Axis Add   Right +0.75 +1.25 028 +2.25   Left -0.25 +1.00 028 +2.25          IMAGING AND PROCEDURES  Imaging and Procedures for _0 @  OCT, Retina - OU - Both Eyes       Right Eye Quality was good. Central Foveal Thickness: 249. Progression has been stable. Findings include normal foveal contour, no SRF, no IRF, outer retinal atrophy (Focal ellipsoid disruption superior to fovea).   Left Eye Quality was good. Central Foveal Thickness: 352. Progression has improved. Findings include no SRF, normal foveal contour, intraretinal fluid (Mac hole closed beautifully; interval improvement in ellipsoid signal; mild cystic changes / CME temporal fovea).   Notes *Images captured and stored  on drive  Diagnosis / Impression:  OD: NFP, No IRF/SRF OS: Mac hole closed beautifully; interval improvement in ellipsoid signal; mild cystic changes / CME temporal fovea   Clinical management:  See  below  Abbreviations: NFP - Normal foveal profile. CME - cystoid macular edema. PED - pigment epithelial detachment. IRF - intraretinal fluid. SRF - subretinal fluid. EZ - ellipsoid zone. ERM - epiretinal membrane. ORA - outer retinal atrophy. ORT - outer retinal tubulation. SRHM - subretinal hyper-reflective material                 ASSESSMENT/PLAN:    ICD-10-CM   1. Macular hole of left eye  H35.342   2. Retinal edema  H35.81 OCT, Retina - OU - Both Eyes  3. Cystoid macular edema of left eye  H35.352   4. Essential hypertension  I10   5. Hypertensive retinopathy of both eyes  H35.033   6. 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 Pseudophakia  Z96.1   8. Dry eyes  H04.123     1,2,3. Full Thickness Macular Hole OS  - preop BCVA 20/100 w/ central scotoma OS  - POM7, s/p PPV/ICG/Membrane peel/C3F8, 01.02.20  - doing well with macular hole closed and ellipsoid signal improved back to baseline essentially  - BCVA: 20/40+ (improved post cataract surgery in June 2020)  - mild focal cystic changes/CME temporal macula  - will recommend restarting PF and ketorolac QID OS  - IOP 6 OS  - F/U 4 weeks -- CME check -- DFE/OCT  4,5. Hypertensive retinopathy OU  - discussed importance of tight BP control  - monitor  6. 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  - discussed diagnosis and progression  - under the expert management of Dr. Shirleen Schirmer  7. Pseudophakia OS  - s/p CE/IOL OS (June 2020, C. [facility])  - beautiful surgery with IOL in excellent position  - tr CME as discussed above  - restarting PF and ketorolac QID OS as above  - monitor  8. Dry eyes OU - recommend artificial tears and lubricating ointment as needed   Ophthalmic Meds Ordered this visit:  Meds ordered this encounter  Medications  . prednisoLONE acetate (PRED FORTE) 1 % ophthalmic  suspension    Sig: Place 1 drop into the left eye 4 (four) times daily.    Dispense:  15 mL    Refill:  0  . ketorolac (ACULAR) 0.5 % ophthalmic solution    Sig: Place 1 drop into the left eye 4 (four) times daily.    Dispense:  10 mL    Refill:  0       Return in about 4 weeks (around 02/25/2019) for f/u CME OS, DFE, OCT.  There are no Patient Instructions on file for this visit.   Explained the diagnoses, plan, and follow up with the patient and they expressed understanding.  Patient expressed understanding of the importance of proper follow up care.   This document serves as a record of services personally performed by Gardiner Sleeper, MD, PhD. It was created on their behalf by Ernest Mallick, OA, an ophthalmic assistant. The creation of this record is the provider's dictation and/or activities during the visit.    Electronically signed by: Ernest Mallick, OA  08.09.2020 12:00 PM     Gardiner Sleeper, M.D., Ph.D. Diseases & Surgery of the Retina  and Vitreous [facility]  I have reviewed the above documentation for accuracy and completeness, and I agree with the above. Gardiner Sleeper, M.D., Ph.D. 01/28/19 12:00 PM    Abbreviations: M myopia (nearsighted); A astigmatism; H hyperopia (farsighted); P presbyopia; Mrx spectacle prescription;  CTL contact lenses; OD right eye; OS left eye; OU both eyes  XT exotropia; ET esotropia; PEK punctate epithelial keratitis; PEE punctate epithelial erosions; DES dry eye syndrome; MGD meibomian gland dysfunction; ATs artificial tears; PFAT's preservative free artificial tears; [place] nuclear sclerotic cataract; PSC posterior subcapsular cataract; ERM epi-retinal membrane; PVD posterior vitreous detachment; RD retinal detachment; DM diabetes mellitus; DR diabetic retinopathy; NPDR non-proliferative diabetic retinopathy; PDR proliferative diabetic retinopathy; CSME clinically significant macular edema; DME diabetic macular edema; dbh  dot blot hemorrhages; CWS cotton wool spot; POAG primary open angle glaucoma; C/D cup-to-disc ratio; HVF humphrey visual field; GVF goldmann visual field; OCT optical coherence tomography; IOP intraocular pressure; BRVO Branch retinal vein occlusion; CRVO central retinal vein occlusion; CRAO central retinal artery occlusion; BRAO branch retinal artery occlusion; RT retinal tear; SB scleral buckle; PPV pars plana vitrectomy; VH Vitreous hemorrhage; PRP panretinal laser photocoagulation; IVK intravitreal kenalog; VMT vitreomacular traction; MH Macular hole;  NVD neovascularization of the disc; NVE neovascularization elsewhere; AREDS age related eye disease study; ARMD age related macular degeneration; POAG primary open angle glaucoma; EBMD epithelial/anterior basement membrane dystrophy; ACIOL anterior chamber intraocular lens; IOL intraocular lens; PCIOL posterior chamber intraocular lens; Phaco/IOL phacoemulsification with intraocular lens placement; [place] photorefractive keratectomy; LASIK laser assisted in situ keratomileusis; HTN hypertension; DM diabetes mellitus; COPD chronic obstructive pulmonary disease

## 2019-01-28 ENCOUNTER — Encounter (INDEPENDENT_AMBULATORY_CARE_PROVIDER_SITE_OTHER): Payer: Self-pay | Admitting: Ophthalmology

## 2019-01-28 ENCOUNTER — Other Ambulatory Visit: Payer: Self-pay

## 2019-01-28 ENCOUNTER — Ambulatory Visit (INDEPENDENT_AMBULATORY_CARE_PROVIDER_SITE_OTHER): Payer: Medicare Other | Admitting: Ophthalmology

## 2019-01-28 DIAGNOSIS — I10 Essential (primary) hypertension: Secondary | ICD-10-CM

## 2019-01-28 DIAGNOSIS — H35352 Cystoid macular degeneration, left eye: Secondary | ICD-10-CM

## 2019-01-28 DIAGNOSIS — H25811 Combined forms of age-related cataract, right eye: Secondary | ICD-10-CM

## 2019-01-28 DIAGNOSIS — Z961 Presence of intraocular lens: Secondary | ICD-10-CM

## 2019-01-28 DIAGNOSIS — H3581 Retinal edema: Secondary | ICD-10-CM

## 2019-01-28 DIAGNOSIS — H35342 Macular cyst, hole, or pseudohole, left eye: Secondary | ICD-10-CM

## 2019-01-28 DIAGNOSIS — H04123 Dry eye syndrome of bilateral lacrimal glands: Secondary | ICD-10-CM

## 2019-01-28 DIAGNOSIS — H35033 Hypertensive retinopathy, bilateral: Secondary | ICD-10-CM

## 2019-01-28 MED ORDER — PREDNISOLONE ACETATE 1 % OP SUSP: 1.0000 [drp] | Freq: Four times a day (QID) | OPHTHALMIC | 0 refills | Status: DC

## 2019-01-28 MED ORDER — KETOROLAC TROMETHAMINE 0.5 % OP SOLN: 1.0000 [drp] | Freq: Four times a day (QID) | OPHTHALMIC | 0 refills | Status: AC

## 2019-02-18 ENCOUNTER — Encounter (INDEPENDENT_AMBULATORY_CARE_PROVIDER_SITE_OTHER): Payer: Medicare Other | Admitting: Ophthalmology

## 2019-02-21 NOTE — Progress Notes (Addendum)
[facility] Note  02/22/2019     CHIEF COMPLAINT Patient presents for Retina Follow Up   HISTORY OF PRESENT ILLNESS: Dominique Lee is a 69 y.o. female who presents to the clinic today for:   HPI    Retina Follow Up    Patient presents with  Wet AMD.  In left eye.  This started 4 weeks ago.  Severity is moderate.  I, the attending physician,  performed the HPI with the patient and updated documentation appropriately.          Comments    Patient here for 4 weeks retina follow up for CME OS. Patient states vision been doing pretty good. Been using eye drop Xiidra BID OU for a week. Vision blurry when first puts drops in the clears up . No eye pain.       Last edited by Bernarda Caffey, MD on 02/26/2019  1:39 AM. (History)    pt states she feels like vision is improving, she states she has been using Xiidra BID for 9 days, she states her vision is very blurry after putting the drop in for about an hour  Referring physician: No referring provider defined for this encounter.  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 Cotter) [BCVA: OD: 20/40- OS: 20/100-] Ocular Hx-central serous chorioretinopathy, DES PMH-arrthymia, HLD, Rheumatoid Arthritis, Thyroid disease    CURRENT MEDICATIONS: Current Outpatient Medications (Ophthalmic Drugs)  Medication Sig  . bacitracin-polymyxin b (POLYSPORIN) ophthalmic ointment Place into the left eye 4 (four) times daily. Place a 1/2 inch ribbon of ointment into the lower eyelid.  Marland Kitchen brimonidine (ALPHAGAN) 0.2 % ophthalmic solution Place 1 drop into the left eye 2 (two) times daily.  . Bromfenac Sodium 0.07 % SOLN Place 1 drop into the left eye 4 (four) times daily.  Marland Kitchen ketorolac (ACULAR) 0.5 % ophthalmic solution Place 1 drop into the left eye 4 (four) times daily.  . prednisoLONE acetate (PRED FORTE)  1 % ophthalmic suspension Place 1 drop into the left eye 4 (four) times daily.  Marland Kitchen Propylene Glycol (SYSTANE COMPLETE) 0.6 % SOLN Place 1 drop into both eyes 3 (three) times daily as needed (dry/irritated eyes.).   No current facility-administered medications for this visit.  (Ophthalmic Drugs)   Current Outpatient Medications (Other)  Medication Sig  . acetaminophen (TYLENOL) 500 MG tablet Take 1,000 mg by mouth at bedtime.  . Cholecalciferol (VITAMIN D-1000 MAX ST) 25 MCG (1000 UT) tablet Take 1,000 Units by mouth daily with lunch.  . cyclobenzaprine (FEXMID) 7.5 MG tablet Take 1 tablet (7.5 mg total) by mouth 3 (three) times daily as needed for muscle spasms.  Marland Kitchen enoxaparin (LOVENOX) 40 MG/0.4ML injection Inject 0.4 mLs (40 mg total) into the skin daily.  . fluticasone (FLONASE) 50 MCG/ACT nasal spray Place 1 spray into both nostrils daily as needed for allergies or rhinitis.  . folic acid (FOLVITE) 1 MG tablet Take 1 mg by mouth daily with lunch.   . ibuprofen (ADVIL,MOTRIN) 200 MG tablet Take 1 tablet (200 mg total) by mouth 3 (three) times daily.  Marland Kitchen leflunomide (ARAVA) 10 MG tablet Take 10 mg by mouth daily with lunch.   . levothyroxine (SYNTHROID, LEVOTHROID) 75 MCG tablet Take 75 mcg by mouth daily before breakfast.   . methotrexate 2.5 MG tablet Take 15 mg by mouth every Thursday. Thursdays with evening meal  .  metoprolol succinate (TOPROL-XL) 50 MG 24 hr tablet Take 1-1.5 tablets (50-75 mg total) by mouth daily. (Patient taking differently: Take 50 mg by mouth at bedtime. )  . omeprazole (PRILOSEC) 20 MG capsule Take 1 capsule (20 mg total) by mouth daily for 14 days. (Patient not taking: Reported on 06/08/2018)  . ondansetron (ZOFRAN) 4 MG tablet   . polyethylene glycol (MIRALAX / GLYCOLAX) packet Take 17 g by mouth daily as needed for mild constipation.   No current facility-administered medications for this visit.  (Other)      REVIEW OF SYSTEMS: ROS    Positive for: Eyes    Negative for: Constitutional, Gastrointestinal, Neurological, Skin, Genitourinary, Musculoskeletal, HENT, Endocrine, Cardiovascular, Respiratory, Psychiatric, Allergic/Imm, Heme/Lymph   Last edited by Theodore Demark, COA on 02/22/2019  9:11 AM. (History)       ALLERGIES Allergies  Allergen Reactions  . Codeine Other (See Comments)    Agitation   . Morphine And Related Other (See Comments)    agitation   . Statins Other (See Comments)    Joint  pain    PAST MEDICAL HISTORY Past Medical History:  Diagnosis Date  . Cancer (HCC)    Basal cell- skin  . Complication of anesthesia   . COPD (chronic obstructive pulmonary disease) ([place])   . Dysrhythmia    [name]  . Family history of adverse reaction to anesthesia    Mother N/V  . Fibromyalgia   . High cholesterol   . Hypothyroidism   . Irregular heart beat   . Osteoarthritis   . Pneumothorax after biopsy   . PONV (postoperative nausea and vomiting)   . [name] (premature ventricular contraction)   . RA (rheumatoid arthritis) ([place])   . Thyroid disease    Hypothyroid   Past Surgical History:  Procedure Laterality Date  . [place] VITRECTOMY WITH 20 GAUGE MVR PORT FOR MACULAR HOLE Left 06/21/2018   Procedure: 25 GAUGE PARS PLANA VITRECTOMY WITH 20 GAUGE MVR PORT FOR MACULAR HOLE;  Surgeon: Bernarda Caffey, MD;  Location: [facility];  Service: Ophthalmology;  Laterality: Left;  . ABDOMINAL HYSTERECTOMY  2000  . BREAST SURGERY     saline implants  . CARDIOVASCULAR STRESS TEST  09/02/2005   Cardiolite Myocardial perfusion study; NegativeBruce protocol exercise stress test  . EYE SURGERY Left    macular hole repair  . GAS/FLUID EXCHANGE Left 06/21/2018   Procedure: GAS/FLUID EXCHANGE;  Surgeon: Bernarda Caffey, MD;  Location: [place];  Service: Ophthalmology;  Laterality: Left;  C3F8  . HIP PINNING,CANNULATED Right 12/18/2017   Procedure: CANNULATED HIP PINNING;  Surgeon: Hiram Gash, MD;  Location: [place];  Service: Orthopedics;   Laterality: Right;  . LAPAROSCOPIC PARTIAL COLECTOMY Left 05/18/2017  . Lung Biospy    . MEMBRANE PEEL Left 06/21/2018   Procedure: MEMBRANE PEEL;  Surgeon: Bernarda Caffey, MD;  Location: [place];  Service: Ophthalmology;  Laterality: Left;  . NASAL SINUS SURGERY    . PHOTOCOAGULATION WITH LASER Left 06/21/2018   Procedure: PHOTOCOAGULATION WITH LASER;  Surgeon: Bernarda Caffey, MD;  Location: [place];  Service: Ophthalmology;  Laterality: Left;  . TUBAL LIGATION    . US ECHOCARDIOGRAPHY  12/26/2011   mild abnormalities    FAMILY HISTORY Family History  Problem Relation Age of Onset  . Hypertension Mother   . Cancer - Lung Father 67       deceased  . Heart attack Maternal Grandmother   . Cancer Maternal Grandfather   . Diabetes  Paternal Grandfather   . Diabetes Brother   . Cancer - Colon Sister     SOCIAL HISTORY Social History   Tobacco Use  . Smoking status: Former Smoker    Types: Cigarettes    Quit date: 11/04/2002    Years since quitting: 16.3  . Smokeless tobacco: Never Used  Substance Use Topics  . Alcohol use: No  . Drug use: No         OPHTHALMIC EXAM:  Base Eye Exam    Visual Acuity (Snellen - Linear)      Right Left   Dist cc 20/20 -2 20/30 -2   Dist ph cc  NI   Correction: Glasses       Tonometry (Tonopen, 9:08 AM)      Right Left   Pressure 12 11       Pupils      Dark Light Shape React APD   Right 3 2 Round Brisk None   Left 4 3 Round Brisk None       Visual Fields (Counting fingers)      Left Right    Full        Extraocular Movement      Right Left    Full, Ortho Full, Ortho       Neuro/Psych    Oriented x3: Yes   Mood/Affect: Normal       Dilation    Both eyes: 1.0% Mydriacyl, 2.5% Phenylephrine @ 9:08 AM        Slit Lamp and Fundus Exam    Slit Lamp Exam      Right Left   Lids/Lashes Dermatochalasis - upper lid Dermatochalasis - upper lid, Meibomian gland dysfunction   Conjunctiva/Sclera White and quiet White and quiet    Cornea 1+ Punctate epithelial erosions inferiorly arcus, 3-4+ diffuse Punctate epithelial erosions, irregular epi inferiorly, endo pigment inferiorly, Well healed cataract wounds   Anterior Chamber Deep and clear Deep, 0.5+cell/pigment   Iris Round and dilated Round and well dilated   Lens 2+ Nuclear sclerosis, 2+ Cortical cataract PC IOL in excellent position   Vitreous Vitreous syneresis, Posterior vitreous detachment post vitrectomy; trace cell/pigmenyt       Fundus Exam      Right Left   Disc Pink and Sharp Pink and Sharp   C/D Ratio 0.5 0.5   Macula Flat, Blunted foveal reflex, RPE mottling and clumping, No heme or edema flat; mac hole closed, blunted foveal reflex, trace cystic changes temporal fovea, trace Retinal pigment epithelial mottling, No heme or edema   Vessels Vascular attenuation, AV crossing changes Vascular attenuation, AV crossing changes   Periphery Attached, No RT/RD Attached, good laser 360        Refraction    Wearing Rx      Sphere Cylinder Axis Add   Right +0.75 +1.25 028 +2.25   Left -0.25 +1.00 028 +2.25          IMAGING AND PROCEDURES  Imaging and Procedures for _0 @  OCT, Retina - OU - Both Eyes       Right Eye Quality was good. Central Foveal Thickness: 261. Progression has been stable. Findings include normal foveal contour, no SRF, no IRF, outer retinal atrophy (Focal ellipsoid disruption superior to fovea, ?improved).   Left Eye Quality was good. Central Foveal Thickness: 337. Progression has improved. Findings include no SRF, normal foveal contour, intraretinal fluid (Mac hole closed beautifully; trace interval improvement in IRF).   Notes *Images captured and stored on  drive  Diagnosis / Impression:  OD: NFP, No IRF/SRF OS: Mac hole closed beautifully; trace interval improvement in IRF  Clinical management:  See below  Abbreviations: NFP - Normal foveal profile. CME - cystoid macular edema. PED - pigment epithelial detachment.  IRF - intraretinal fluid. SRF - subretinal fluid. EZ - ellipsoid zone. ERM - epiretinal membrane. ORA - outer retinal atrophy. ORT - outer retinal tubulation. SRHM - subretinal hyper-reflective material                 ASSESSMENT/PLAN:    ICD-10-CM   1. Macular hole of left eye  H35.342   2. Retinal edema  H35.81 OCT, Retina - OU - Both Eyes  3. Cystoid macular edema of left eye  H35.352   4. Essential hypertension  I10   5. Hypertensive retinopathy of both eyes  H35.033   6. 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 Pseudophakia  Z96.1   8. Dry eyes  H04.123     1,2,3. Full Thickness Macular Hole OS  - preop BCVA 20/100 w/ central scotoma OS  - s/p PPV/ICG/Membrane peel/C3F8, 01.02.20  - doing well with macular hole closed and ellipsoid signal improved back to baseline essentially  - BCVA: 20/30-- (improved post cataract surgery in June 2020)  - mild focal cystic changes/CME temporal macula -- trace interval improvement  - recommend continuing PF QID OS and starting Prolensa QID OS in place of ketorolac  - IOP 11 OS  - F/U 3-4 weeks -- CME check -- DFE/OCT  4,5. Hypertensive retinopathy OU  - discussed importance of tight BP control  - monitor  6. 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  - discussed diagnosis and progression  - under the expert management of Dr. Shirleen Schirmer  7. Pseudophakia OS  - s/p CE/IOL OS (June 2020, C. [facility])  - beautiful surgery with IOL in excellent position  - tr CME as discussed above  - restarting PF and ketorolac QID OS as above  - monitor  8. Dry eyes OU - recommend artificial tears and lubricating ointment as needed - started [place] Ordered this visit:  Meds ordered this encounter  Medications  . Bromfenac Sodium 0.07 % SOLN    Sig: Place 1 drop into the left eye 4 (four) times daily.     Dispense:  3 mL    Refill:  5       Return for 3-4 wks f/u CME OS -- Dilated Exam, OCT.  There are no Patient Instructions on file for this visit.   Explained the diagnoses, plan, and follow up with the patient and they expressed understanding.  Patient expressed understanding of the importance of proper follow up care.   This document serves as a record of services personally performed by Gardiner Sleeper, MD, PhD. It was created on their behalf by Ernest Mallick, OA, an ophthalmic assistant. The creation of this record is the provider's dictation and/or activities during the visit.    Electronically signed by: Ernest Mallick, OA  09.04.2020 1:42 AM     Gardiner Sleeper, M.D., Ph.D. Diseases & Surgery of the Retina and Vitreous [facility]  I have reviewed the above documentation for accuracy and completeness, and I agree with the above. Gardiner Sleeper, M.D., Ph.D. 02/26/19 1:42 AM   Abbreviations: Jerilynn Mages myopia (  nearsighted); A astigmatism; H hyperopia (farsighted); P presbyopia; Mrx spectacle prescription;  CTL contact lenses; OD right eye; OS left eye; OU both eyes  XT exotropia; ET esotropia; PEK punctate epithelial keratitis; PEE punctate epithelial erosions; DES dry eye syndrome; MGD meibomian gland dysfunction; ATs artificial tears; PFAT's preservative free artificial tears; [place] nuclear sclerotic cataract; PSC posterior subcapsular cataract; ERM epi-retinal membrane; PVD posterior vitreous detachment; RD retinal detachment; DM diabetes mellitus; DR diabetic retinopathy; NPDR non-proliferative diabetic retinopathy; PDR proliferative diabetic retinopathy; CSME clinically significant macular edema; DME diabetic macular edema; dbh dot blot hemorrhages; CWS cotton wool spot; POAG primary open angle glaucoma; C/D cup-to-disc ratio; HVF humphrey visual field; GVF goldmann visual field; OCT optical coherence tomography; IOP intraocular pressure; BRVO Branch retinal vein  occlusion; CRVO central retinal vein occlusion; CRAO central retinal artery occlusion; BRAO branch retinal artery occlusion; RT retinal tear; SB scleral buckle; PPV pars plana vitrectomy; VH Vitreous hemorrhage; PRP panretinal laser photocoagulation; IVK intravitreal kenalog; VMT vitreomacular traction; MH Macular hole;  NVD neovascularization of the disc; NVE neovascularization elsewhere; AREDS age related eye disease study; ARMD age related macular degeneration; POAG primary open angle glaucoma; EBMD epithelial/anterior basement membrane dystrophy; ACIOL anterior chamber intraocular lens; IOL intraocular lens; PCIOL posterior chamber intraocular lens; Phaco/IOL phacoemulsification with intraocular lens placement; [place] photorefractive keratectomy; LASIK laser assisted in situ keratomileusis; HTN hypertension; DM diabetes mellitus; COPD chronic obstructive pulmonary disease

## 2019-02-22 ENCOUNTER — Other Ambulatory Visit: Payer: Self-pay

## 2019-02-22 ENCOUNTER — Encounter (INDEPENDENT_AMBULATORY_CARE_PROVIDER_SITE_OTHER): Payer: Self-pay | Admitting: Ophthalmology

## 2019-02-22 ENCOUNTER — Other Ambulatory Visit (INDEPENDENT_AMBULATORY_CARE_PROVIDER_SITE_OTHER): Payer: Self-pay | Admitting: Ophthalmology

## 2019-02-22 ENCOUNTER — Ambulatory Visit (INDEPENDENT_AMBULATORY_CARE_PROVIDER_SITE_OTHER): Payer: Medicare Other | Admitting: Ophthalmology

## 2019-02-22 DIAGNOSIS — I10 Essential (primary) hypertension: Secondary | ICD-10-CM | POA: Diagnosis not present

## 2019-02-22 DIAGNOSIS — H35033 Hypertensive retinopathy, bilateral: Secondary | ICD-10-CM

## 2019-02-22 DIAGNOSIS — H35342 Macular cyst, hole, or pseudohole, left eye: Secondary | ICD-10-CM

## 2019-02-22 DIAGNOSIS — H3581 Retinal edema: Secondary | ICD-10-CM

## 2019-02-22 DIAGNOSIS — H35352 Cystoid macular degeneration, left eye: Secondary | ICD-10-CM | POA: Diagnosis not present

## 2019-02-22 DIAGNOSIS — H04123 Dry eye syndrome of bilateral lacrimal glands: Secondary | ICD-10-CM

## 2019-02-22 DIAGNOSIS — Z961 Presence of intraocular lens: Secondary | ICD-10-CM

## 2019-02-22 DIAGNOSIS — H25811 Combined forms of age-related cataract, right eye: Secondary | ICD-10-CM

## 2019-02-22 MED ORDER — BROMFENAC SODIUM 0.07 % OP SOLN: 1.0000 [drp] | Freq: Four times a day (QID) | OPHTHALMIC | 5 refills | Status: DC

## 2019-02-26 ENCOUNTER — Encounter (INDEPENDENT_AMBULATORY_CARE_PROVIDER_SITE_OTHER): Payer: Self-pay | Admitting: Ophthalmology

## 2019-03-17 NOTE — Progress Notes (Signed)
[facility] Note  03/18/2019     CHIEF COMPLAINT Patient presents for Retina Follow Up   HISTORY OF PRESENT ILLNESS: Dominique Lee is a 68 y.o. female who presents to the clinic today for:   HPI    Retina Follow Up    Patient presents with  Other (Macular hole).  In left eye.  Severity is moderate.  Duration of 3 weeks.  Since onset it is gradually improving.  I, the attending physician,  performed the HPI with the patient and updated documentation appropriately.          Comments    Patient states some improvement in vision OS. Started xiidra bid OU for dry eyes. Causes some blurriness after using eye gtt. Also using PF and prolensa qid OS per Dr. Coralyn Pear.       Last edited by Bernarda Caffey, MD on 03/18/2019 10:02 AM. (History)    pt states she feels like her left eye vision is improving, but she is using xiidra and it makes her vision very blurry "for hours", she is also using AT's QID OU, she states she is using PF and prolensa QID   Referring physician: No referring provider defined for this encounter.  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 Cotter) [BCVA: OD: 20/40- OS: 20/100-] Ocular Hx-central serous chorioretinopathy, DES PMH-arrthymia, HLD, Rheumatoid Arthritis, Thyroid disease    CURRENT MEDICATIONS: Current Outpatient Medications (Ophthalmic Drugs)  Medication Sig  . Bromfenac Sodium 0.07 % SOLN Place 1 drop into the left eye 4 (four) times daily.  . prednisoLONE acetate (PRED FORTE) 1 % ophthalmic suspension Place 1 drop into the left eye 4 (four) times daily.  Marland Kitchen Propylene Glycol (SYSTANE COMPLETE) 0.6 % SOLN Place 1 drop into both eyes 3 (three) times daily as needed (dry/irritated eyes.).  Marland Kitchen bacitracin-polymyxin b (POLYSPORIN) ophthalmic ointment Place into the left eye 4 (four) times daily. Place a 1/2  inch ribbon of ointment into the lower eyelid. (Patient not taking: Reported on 03/18/2019)  . brimonidine (ALPHAGAN) 0.2 % ophthalmic solution Place 1 drop into the left eye 2 (two) times daily. (Patient not taking: Reported on 03/18/2019)  . ketorolac (ACULAR) 0.5 % ophthalmic solution Place 1 drop into the left eye 4 (four) times daily.   No current facility-administered medications for this visit.  (Ophthalmic Drugs)   Current Outpatient Medications (Other)  Medication Sig  . acetaminophen (TYLENOL) 500 MG tablet Take 1,000 mg by mouth at bedtime.  . Cholecalciferol (VITAMIN D-1000 MAX ST) 25 MCG (1000 UT) tablet Take 1,000 Units by mouth daily with lunch.  . cyclobenzaprine (FEXMID) 7.5 MG tablet Take 1 tablet (7.5 mg total) by mouth 3 (three) times daily as needed for muscle spasms.  . fluticasone (FLONASE) 50 MCG/ACT nasal spray Place 1 spray into both nostrils daily as needed for allergies or rhinitis.  . folic acid (FOLVITE) 1 MG tablet Take 1 mg by mouth daily with lunch.   . ibuprofen (ADVIL,MOTRIN) 200 MG tablet Take 1 tablet (200 mg total) by mouth 3 (three) times daily.  Marland Kitchen leflunomide (ARAVA) 10 MG tablet Take 10 mg by mouth daily with lunch.   . levothyroxine (SYNTHROID, LEVOTHROID) 75 MCG tablet Take 75 mcg by mouth daily before breakfast.   . methotrexate 2.5 MG tablet Take 15 mg by mouth every Thursday. Thursdays with evening meal  . metoprolol  succinate (TOPROL-XL) 50 MG 24 hr tablet Take 1-1.5 tablets (50-75 mg total) by mouth daily. (Patient taking differently: Take 50 mg by mouth at bedtime. )  . ondansetron (ZOFRAN) 4 MG tablet   . polyethylene glycol (MIRALAX / GLYCOLAX) packet Take 17 g by mouth daily as needed for mild constipation.  . enoxaparin (LOVENOX) 40 MG/0.4ML injection Inject 0.4 mLs (40 mg total) into the skin daily.  Marland Kitchen omeprazole (PRILOSEC) 20 MG capsule Take 1 capsule (20 mg total) by mouth daily for 14 days. (Patient not taking: Reported on 06/08/2018)   No  current facility-administered medications for this visit.  (Other)      REVIEW OF SYSTEMS: ROS    Positive for: Eyes   Negative for: Constitutional, Gastrointestinal, Neurological, Skin, Genitourinary, Musculoskeletal, HENT, Endocrine, Cardiovascular, Respiratory, Psychiatric, Allergic/Imm, Heme/Lymph   Last edited by Roselee Nova D, COT on 03/18/2019  9:13 AM. (History)       ALLERGIES Allergies  Allergen Reactions  . Codeine Other (See Comments)    Agitation   . Morphine And Related Other (See Comments)    agitation   . Statins Other (See Comments)    Joint  pain    PAST MEDICAL HISTORY Past Medical History:  Diagnosis Date  . Cancer (HCC)    Basal cell- skin  . Complication of anesthesia   . COPD (chronic obstructive pulmonary disease) ([place])   . Dysrhythmia    [name]  . Family history of adverse reaction to anesthesia    Mother N/V  . Fibromyalgia   . High cholesterol   . Hypothyroidism   . Irregular heart beat   . Osteoarthritis   . Pneumothorax after biopsy   . PONV (postoperative nausea and vomiting)   . [name] (premature ventricular contraction)   . RA (rheumatoid arthritis) ([place])   . Thyroid disease    Hypothyroid   Past Surgical History:  Procedure Laterality Date  . [place] VITRECTOMY WITH 20 GAUGE MVR PORT FOR MACULAR HOLE Left 06/21/2018   Procedure: 25 GAUGE PARS PLANA VITRECTOMY WITH 20 GAUGE MVR PORT FOR MACULAR HOLE;  Surgeon: Bernarda Caffey, MD;  Location: [place];  Service: Ophthalmology;  Laterality: Left;  . ABDOMINAL HYSTERECTOMY  2000  . BREAST SURGERY     saline implants  . CARDIOVASCULAR STRESS TEST  09/02/2005   Cardiolite Myocardial perfusion study; NegativeBruce protocol exercise stress test  . EYE SURGERY Left    macular hole repair  . GAS/FLUID EXCHANGE Left 06/21/2018   Procedure: GAS/FLUID EXCHANGE;  Surgeon: Bernarda Caffey, MD;  Location: [place];  Service: Ophthalmology;  Laterality: Left;  C3F8  . HIP PINNING,CANNULATED Right  12/18/2017   Procedure: CANNULATED HIP PINNING;  Surgeon: Hiram Gash, MD;  Location: [place];  Service: Orthopedics;  Laterality: Right;  . LAPAROSCOPIC PARTIAL COLECTOMY Left 05/18/2017  . Lung Biospy    . MEMBRANE PEEL Left 06/21/2018   Procedure: MEMBRANE PEEL;  Surgeon: Bernarda Caffey, MD;  Location: [place];  Service: Ophthalmology;  Laterality: Left;  . NASAL SINUS SURGERY    . PHOTOCOAGULATION WITH LASER Left 06/21/2018   Procedure: PHOTOCOAGULATION WITH LASER;  Surgeon: Bernarda Caffey, MD;  Location: [facility];  Service: Ophthalmology;  Laterality: Left;  . TUBAL LIGATION    . US ECHOCARDIOGRAPHY  12/26/2011   mild abnormalities    FAMILY HISTORY Family History  Problem Relation Age of Onset  . Hypertension Mother   . Cancer - Lung Father 59       deceased  .  Heart attack Maternal Grandmother   . Cancer Maternal Grandfather   . Diabetes Paternal Grandfather   . Diabetes Brother   . Cancer - Colon Sister     SOCIAL HISTORY Social History   Tobacco Use  . Smoking status: Former Smoker    Types: Cigarettes    Quit date: 11/04/2002    Years since quitting: 16.3  . Smokeless tobacco: Never Used  Substance Use Topics  . Alcohol use: No  . Drug use: No         OPHTHALMIC EXAM:  Base Eye Exam    Visual Acuity (Snellen - Linear)      Right Left   Dist cc 20/25 +2 20/30 -3   Dist ph cc NI 20/30 -1   Correction: Glasses       Tonometry (Tonopen, 9:25 AM)      Right Left   Pressure 13 17       Pupils      Dark Light Shape React APD   Right 3 2 Round Brisk None   Left 4 3.5 Round Minimal None       Visual Fields (Counting fingers)      Left Right    Full Full       Extraocular Movement      Right Left    Full, Ortho Full, Ortho       Neuro/Psych    Oriented x3: Yes   Mood/Affect: Normal       Dilation    Both eyes: 1.0% Mydriacyl, 2.5% Phenylephrine @ 9:31 AM        Slit Lamp and Fundus Exam    Slit Lamp Exam      Right Left   Lids/Lashes  Dermatochalasis - upper lid Dermatochalasis - upper lid, Meibomian gland dysfunction   Conjunctiva/Sclera White and quiet White and quiet   Cornea 1-2+ Punctate epithelial erosions inferiorly arcus, 3-4+ diffuse Punctate epithelial erosions, irregular epi inferiorly, endo pigment inferiorly, Well healed cataract wounds   Anterior Chamber Deep and clear Deep, 0.5+cell/pigment   Iris Round and dilated Round and well dilated   Lens 2+ Nuclear sclerosis, 2+ Cortical cataract PC IOL in excellent position   Vitreous Vitreous syneresis, Posterior vitreous detachment post vitrectomy; trace cell/pigmenyt       Fundus Exam      Right Left   Disc Pink and Sharp Pink and Sharp   C/D Ratio 0.5 0.5   Macula Flat, Blunted foveal reflex, RPE mottling and clumping, No heme or edema flat; mac hole closed, blunted foveal reflex, trace cystic changes temporal fovea - improved, trace Retinal pigment epithelial mottling, No heme   Vessels Vascular attenuation, AV crossing changes Vascular attenuation, Tortuous   Periphery Attached, No RT/RD Attached, good laser 360        Refraction    Wearing Rx      Sphere Cylinder Axis Add   Right +0.75 +1.25 028 +2.25   Left -0.25 +1.00 028 +2.25       Manifest Refraction      Sphere Cylinder Axis Dist VA   Right       Left -0.25 +1.00 030 20/30-1          IMAGING AND PROCEDURES  Imaging and Procedures for _0 @  OCT, Retina - OU - Both Eyes       Right Eye Quality was good. Central Foveal Thickness: 254. Progression has improved. Findings include normal foveal contour, no SRF, no IRF, outer retinal atrophy (Focal ellipsoid disruption  superior to fovea -- improved).   Left Eye Quality was good. Central Foveal Thickness: 338. Progression has improved. Findings include no SRF, normal foveal contour, intraretinal fluid (Mac hole closed beautifully; mild interval improvement in temporal cystic changes).   Notes *Images captured and stored on  drive  Diagnosis / Impression:  OD: NFP, No IRF/SRF; Focal ellipsoid disruption superior to fovea -- improved OS: Mac hole closed beautifully; mild interval improvement in temporal cystic changes  Clinical management:  See below  Abbreviations: NFP - Normal foveal profile. CME - cystoid macular edema. PED - pigment epithelial detachment. IRF - intraretinal fluid. SRF - subretinal fluid. EZ - ellipsoid zone. ERM - epiretinal membrane. ORA - outer retinal atrophy. ORT - outer retinal tubulation. SRHM - subretinal hyper-reflective material                 ASSESSMENT/PLAN:    ICD-10-CM   1. Macular hole of left eye  H35.342   2. Retinal edema  H35.81 OCT, Retina - OU - Both Eyes  3. Cystoid macular edema of left eye  H35.352   4. Essential hypertension  I10   5. Hypertensive retinopathy of both eyes  H35.033   6. 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 Pseudophakia  Z96.1   8. Dry eyes  H04.123     1,2,3. Full Thickness Macular Hole OS  - preop BCVA 20/100 w/ central scotoma OS  - s/p PPV/ICG/Membrane peel/C3F8, 01.02.20  - doing well with macular hole closed and ellipsoid signal improved back to baseline essentially  - BCVA: 20/30-- (improved post cataract surgery in June 2020)  - mild focal cystic changes/CME temporal macula -- trace interval improvement  - recommend continuing PF QID OS and Prolensa QID OS   - IOP 17 OS  - F/U 4 weeks -- CME check -- DFE/OCT  4,5. Hypertensive retinopathy OU  - discussed importance of tight BP control  - monitor  6. 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  - discussed diagnosis and progression  - under the expert management of Dr. Shirleen Schirmer  7. Pseudophakia OS  - s/p CE/IOL OS (June 2020, C. [facility])  - beautiful surgery with IOL in excellent position  - tr CME as discussed above  - continuing PF and Prolensa  QID OS as above  - monitor  8. Dry eyes OU - recommend artificial tears and lubricating ointment as needed - currently on Xiidra   Ophthalmic Meds Ordered this visit:  No orders of the defined types were placed in this encounter.      Return in about 4 weeks (around 04/15/2019) for f/u CME check OS, DFE, OCT.  There are no Patient Instructions on file for this visit.   Explained the diagnoses, plan, and follow up with the patient and they expressed understanding.  Patient expressed understanding of the importance of proper follow up care.   This document serves as a record of services personally performed by Gardiner Sleeper, MD, PhD. It was created on their behalf by Ernest Mallick, OA, an ophthalmic assistant. The creation of this record is the provider's dictation and/or activities during the visit.    Electronically signed by: Ernest Mallick, OA  09.27.2020 4:54 PM    Gardiner Sleeper, M.D., Ph.D. Diseases & Surgery of the Retina and Vitreous [facility]  I have reviewed the  above documentation for accuracy and completeness, and I agree with the above. Gardiner Sleeper, M.D., Ph.D. 03/18/19 4:54 PM    Abbreviations: M myopia (nearsighted); A astigmatism; H hyperopia (farsighted); P presbyopia; Mrx spectacle prescription;  CTL contact lenses; OD right eye; OS left eye; OU both eyes  XT exotropia; ET esotropia; PEK punctate epithelial keratitis; PEE punctate epithelial erosions; DES dry eye syndrome; MGD meibomian gland dysfunction; ATs artificial tears; PFAT's preservative free artificial tears; [place] nuclear sclerotic cataract; PSC posterior subcapsular cataract; ERM epi-retinal membrane; PVD posterior vitreous detachment; RD retinal detachment; DM diabetes mellitus; DR diabetic retinopathy; NPDR non-proliferative diabetic retinopathy; PDR proliferative diabetic retinopathy; CSME clinically significant macular edema; DME diabetic macular edema; dbh dot blot  hemorrhages; CWS cotton wool spot; POAG primary open angle glaucoma; C/D cup-to-disc ratio; HVF humphrey visual field; GVF goldmann visual field; OCT optical coherence tomography; IOP intraocular pressure; BRVO Branch retinal vein occlusion; CRVO central retinal vein occlusion; CRAO central retinal artery occlusion; BRAO branch retinal artery occlusion; RT retinal tear; SB scleral buckle; PPV pars plana vitrectomy; VH Vitreous hemorrhage; PRP panretinal laser photocoagulation; IVK intravitreal kenalog; VMT vitreomacular traction; MH Macular hole;  NVD neovascularization of the disc; NVE neovascularization elsewhere; AREDS age related eye disease study; ARMD age related macular degeneration; POAG primary open angle glaucoma; EBMD epithelial/anterior basement membrane dystrophy; ACIOL anterior chamber intraocular lens; IOL intraocular lens; PCIOL posterior chamber intraocular lens; Phaco/IOL phacoemulsification with intraocular lens placement; [place] photorefractive keratectomy; LASIK laser assisted in situ keratomileusis; HTN hypertension; DM diabetes mellitus; COPD chronic obstructive pulmonary disease

## 2019-03-18 ENCOUNTER — Encounter (INDEPENDENT_AMBULATORY_CARE_PROVIDER_SITE_OTHER): Payer: Self-pay | Admitting: Ophthalmology

## 2019-03-18 ENCOUNTER — Other Ambulatory Visit: Payer: Self-pay

## 2019-03-18 ENCOUNTER — Ambulatory Visit (INDEPENDENT_AMBULATORY_CARE_PROVIDER_SITE_OTHER): Payer: Medicare Other | Admitting: Ophthalmology

## 2019-03-18 DIAGNOSIS — H3581 Retinal edema: Secondary | ICD-10-CM | POA: Diagnosis not present

## 2019-03-18 DIAGNOSIS — I10 Essential (primary) hypertension: Secondary | ICD-10-CM

## 2019-03-18 DIAGNOSIS — Z961 Presence of intraocular lens: Secondary | ICD-10-CM

## 2019-03-18 DIAGNOSIS — H35033 Hypertensive retinopathy, bilateral: Secondary | ICD-10-CM

## 2019-03-18 DIAGNOSIS — H35352 Cystoid macular degeneration, left eye: Secondary | ICD-10-CM | POA: Diagnosis not present

## 2019-03-18 DIAGNOSIS — H04123 Dry eye syndrome of bilateral lacrimal glands: Secondary | ICD-10-CM

## 2019-03-18 DIAGNOSIS — H25811 Combined forms of age-related cataract, right eye: Secondary | ICD-10-CM

## 2019-03-18 DIAGNOSIS — H35342 Macular cyst, hole, or pseudohole, left eye: Secondary | ICD-10-CM | POA: Diagnosis not present

## 2019-04-15 ENCOUNTER — Ambulatory Visit (INDEPENDENT_AMBULATORY_CARE_PROVIDER_SITE_OTHER): Payer: Medicare Other | Admitting: Ophthalmology

## 2019-04-15 ENCOUNTER — Encounter (INDEPENDENT_AMBULATORY_CARE_PROVIDER_SITE_OTHER): Payer: Self-pay | Admitting: Ophthalmology

## 2019-04-15 DIAGNOSIS — H04123 Dry eye syndrome of bilateral lacrimal glands: Secondary | ICD-10-CM

## 2019-04-15 DIAGNOSIS — H35033 Hypertensive retinopathy, bilateral: Secondary | ICD-10-CM

## 2019-04-15 DIAGNOSIS — H35342 Macular cyst, hole, or pseudohole, left eye: Secondary | ICD-10-CM | POA: Diagnosis not present

## 2019-04-15 DIAGNOSIS — H35352 Cystoid macular degeneration, left eye: Secondary | ICD-10-CM | POA: Diagnosis not present

## 2019-04-15 DIAGNOSIS — Z961 Presence of intraocular lens: Secondary | ICD-10-CM

## 2019-04-15 DIAGNOSIS — H25813 Combined forms of age-related cataract, bilateral: Secondary | ICD-10-CM

## 2019-04-15 DIAGNOSIS — H25811 Combined forms of age-related cataract, right eye: Secondary | ICD-10-CM

## 2019-04-15 DIAGNOSIS — I10 Essential (primary) hypertension: Secondary | ICD-10-CM

## 2019-04-15 DIAGNOSIS — H3581 Retinal edema: Secondary | ICD-10-CM

## 2019-04-15 NOTE — Progress Notes (Signed)
[facility] Note  04/15/2019     CHIEF COMPLAINT Patient presents for Retina Follow Up   HISTORY OF PRESENT ILLNESS: Dominique Lee is a 68 y.o. female who presents to the clinic today for:   HPI    Retina Follow Up    Patient presents with  Other.  In left eye.  This started months ago.  Severity is moderate.  Duration of 4 weeks.  Since onset it is stable.  I, the attending physician,  performed the HPI with the patient and updated documentation appropriately.          Comments    68 y/o female pt here for 4 wk f/u for FTMH OS.  S/p PPV/MP OS 1.2.20.  No change in [place] OU.  Occasionally sees a central flash of light OS that lasts for a few seconds.  Last occurred yesterday while taking a shower.  Denies pain, new floaters.  PF QID OS Prolensa QID OS Xiidra QHS OU       Last edited by Bernarda Caffey, MD on 04/18/2019 12:26 AM. (History)    pt states vision seems the same OU. Sees occasional flash OS.   Referring physician: Madelin Headings, DO [address] [place],  [place] [id number]  HISTORICAL INFORMATION:   Selected notes from the MEDICAL RECORD NUMBER 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 Cotter) [BCVA: OD: 20/40- OS: 20/100-] Ocular Hx-central serous chorioretinopathy, DES PMH-arrthymia, HLD, Rheumatoid Arthritis, Thyroid disease    CURRENT MEDICATIONS: Current Outpatient Medications (Ophthalmic Drugs)  Medication Sig  . prednisoLONE acetate (PRED FORTE) 1 % ophthalmic suspension Place 1 drop into the left eye 4 (four) times daily.  Marland Kitchen XIIDRA 5 % SOLN Apply 1 drop to eye 2 (two) times daily.  . bacitracin-polymyxin b (POLYSPORIN) ophthalmic ointment Place into the left eye 4 (four) times daily. Place a 1/2 inch ribbon of ointment into the lower eyelid. (Patient not taking: Reported on 04/15/2019)  . brimonidine (ALPHAGAN) 0.2 % ophthalmic solution Place 1 drop into the left eye 2  (two) times daily. (Patient not taking: Reported on 04/15/2019)  . Bromfenac Sodium 0.07 % SOLN Place 1 drop into the left eye 4 (four) times daily. (Patient not taking: Reported on 04/15/2019)  . ketorolac (ACULAR) 0.5 % ophthalmic solution Place 1 drop into the left eye 4 (four) times daily. (Patient not taking: Reported on 04/15/2019)  . Propylene Glycol (SYSTANE COMPLETE) 0.6 % SOLN Place 1 drop into both eyes 3 (three) times daily as needed (dry/irritated eyes.).   No current facility-administered medications for this visit.  (Ophthalmic Drugs)   Current Outpatient Medications (Other)  Medication Sig  . acetaminophen (TYLENOL) 500 MG tablet Take 1,000 mg by mouth at bedtime.  . Cholecalciferol (VITAMIN D-1000 MAX ST) 25 MCG (1000 UT) tablet Take 1,000 Units by mouth daily with lunch.  . cyclobenzaprine (FEXMID) 7.5 MG tablet Take 1 tablet (7.5 mg total) by mouth 3 (three) times daily as needed for muscle spasms.  . fluticasone (FLONASE) 50 MCG/ACT nasal spray Place 1 spray into both nostrils daily as needed for allergies or rhinitis.  . folic acid (FOLVITE) 1 MG tablet Take 1 mg by mouth daily with lunch.   . ibuprofen (ADVIL,MOTRIN) 200 MG tablet Take 1 tablet (200 mg total) by mouth 3 (three) times daily.  Marland Kitchen leflunomide (ARAVA) 10 MG tablet Take 10 mg by mouth daily with lunch.   . levothyroxine (SYNTHROID, LEVOTHROID)  75 MCG tablet Take 75 mcg by mouth daily before breakfast.   . methotrexate 2.5 MG tablet Take 15 mg by mouth every Thursday. Thursdays with evening meal  . metoprolol succinate (TOPROL-XL) 50 MG 24 hr tablet Take 1-1.5 tablets (50-75 mg total) by mouth daily. (Patient taking differently: Take 50 mg by mouth at bedtime. )  . ondansetron (ZOFRAN) 4 MG tablet   . polyethylene glycol (MIRALAX / GLYCOLAX) packet Take 17 g by mouth daily as needed for mild constipation.  . enoxaparin (LOVENOX) 40 MG/0.4ML injection Inject 0.4 mLs (40 mg total) into the skin daily.  Marland Kitchen omeprazole  (PRILOSEC) 20 MG capsule Take 1 capsule (20 mg total) by mouth daily for 14 days. (Patient not taking: Reported on 06/08/2018)   No current facility-administered medications for this visit.  (Other)      REVIEW OF SYSTEMS: ROS    Positive for: Musculoskeletal, Cardiovascular, Eyes, Respiratory   Negative for: Constitutional, Gastrointestinal, Neurological, Skin, Genitourinary, HENT, Endocrine, Psychiatric, Allergic/Imm, Heme/Lymph   Last edited by Matthew Folks, COA on 04/15/2019 10:12 AM. (History)       ALLERGIES Allergies  Allergen Reactions  . Codeine Other (See Comments)    Agitation   . Morphine And Related Other (See Comments)    agitation   . Statins Other (See Comments)    Joint  pain    PAST MEDICAL HISTORY Past Medical History:  Diagnosis Date  . Cancer (HCC)    Basal cell- skin  . Cataract    OD  . Complication of anesthesia   . COPD (chronic obstructive pulmonary disease) ([place])   . Dysrhythmia    [name]  . Family history of adverse reaction to anesthesia    Mother N/V  . Fibromyalgia   . High cholesterol   . Hypertensive retinopathy    OU  . Hypothyroidism   . Irregular heart beat   . Osteoarthritis   . Pneumothorax after biopsy   . PONV (postoperative nausea and vomiting)   . [name] (premature ventricular contraction)   . RA (rheumatoid arthritis) ([place])   . Thyroid disease    Hypothyroid   Past Surgical History:  Procedure Laterality Date  . [place] VITRECTOMY WITH 20 GAUGE MVR PORT FOR MACULAR HOLE Left 06/21/2018   Procedure: 25 GAUGE PARS PLANA VITRECTOMY WITH 20 GAUGE MVR PORT FOR MACULAR HOLE;  Surgeon: Bernarda Caffey, MD;  Location: [facility];  Service: Ophthalmology;  Laterality: Left;  . ABDOMINAL HYSTERECTOMY  2000  . BREAST SURGERY     saline implants  . CARDIOVASCULAR STRESS TEST  09/02/2005   Cardiolite Myocardial perfusion study; NegativeBruce protocol exercise stress test  . CATARACT EXTRACTION Left 11/2018   Dr. Midge Aver   . EYE SURGERY Left    macular hole repair  . GAS/FLUID EXCHANGE Left 06/21/2018   Procedure: GAS/FLUID EXCHANGE;  Surgeon: Bernarda Caffey, MD;  Location: [facility];  Service: Ophthalmology;  Laterality: Left;  C3F8  . HIP PINNING,CANNULATED Right 12/18/2017   Procedure: CANNULATED HIP PINNING;  Surgeon: Hiram Gash, MD;  Location: [facility];  Service: Orthopedics;  Laterality: Right;  . LAPAROSCOPIC PARTIAL COLECTOMY Left 05/18/2017  . Lung Biospy    . MEMBRANE PEEL Left 06/21/2018   Procedure: MEMBRANE PEEL;  Surgeon: Bernarda Caffey, MD;  Location: [facility];  Service: Ophthalmology;  Laterality: Left;  . NASAL SINUS SURGERY    . PHOTOCOAGULATION WITH LASER Left 06/21/2018   Procedure: PHOTOCOAGULATION WITH LASER;  Surgeon: Bernarda Caffey, MD;  Location: [facility];  Service: Ophthalmology;  Laterality: Left;  . TUBAL LIGATION    . US ECHOCARDIOGRAPHY  12/26/2011   mild abnormalities    FAMILY HISTORY Family History  Problem Relation Age of Onset  . Hypertension Mother   . Cancer - Lung Father 52       deceased  . Heart attack Maternal Grandmother   . Cancer Maternal Grandfather   . Diabetes Paternal Grandfather   . Diabetes Brother   . Cancer - Colon Sister     SOCIAL HISTORY Social History   Tobacco Use  . Smoking status: Former Smoker    Types: Cigarettes    Quit date: 11/04/2002    Years since quitting: 16.4  . Smokeless tobacco: Never Used  Substance Use Topics  . Alcohol use: No  . Drug use: No         OPHTHALMIC EXAM:  Base Eye Exam    Visual Acuity (Snellen - Linear)      Right Left   Dist cc 20/20 -2 20/30 -2   Dist ph cc  NI       Tonometry (Tonopen, 10:17 AM)      Right Left   Pressure 12 13       Pupils      Dark Light Shape React APD   Right 3 2 Round Brisk None   Left 4 3 Round Minimal None       Visual Fields (Counting fingers)      Left Right    Full Full       Extraocular Movement      Right Left    Full, Ortho Full, Ortho       Neuro/Psych     Oriented x3: Yes   Mood/Affect: Normal       Dilation    Both eyes: 1.0% Mydriacyl, 2.5% Phenylephrine @ 10:17 AM        Slit Lamp and Fundus Exam    Slit Lamp Exam      Right Left   Lids/Lashes Dermatochalasis - upper lid Dermatochalasis - upper lid, Meibomian gland dysfunction   Conjunctiva/Sclera White and quiet White and quiet   Cornea 1-2+ Punctate epithelial erosions inferiorly arcus, 3+ diffuse Punctate epithelial erosions, endo pigment inferiorly, Well healed cataract wounds, Keratic precipitates   Anterior Chamber Deep and clear Deep, 0.5+cell/pigment   Iris Round and dilated Round and well dilated   Lens 2+ Nuclear sclerosis, 2+ Cortical cataract PC IOL in excellent position, trace PCO   Vitreous Vitreous syneresis, Posterior vitreous detachment post vitrectomy; trace cell/pigmenyt       Fundus Exam      Right Left   Disc Pink and Sharp trace pallor, sharp rim   C/D Ratio 0.5 0.5   Macula Flat, Blunted foveal reflex, RPE mottling and clumping, No heme or edema flat; mac hole closed, good foveal reflex, trace cystic changes temporal fovea - improved, trace Retinal pigment epithelial mottling, No heme   Vessels Vascular attenuation, AV crossing changes Vascular attenuation, Tortuous   Periphery Attached, No RT/RD Attached, good laser 360          IMAGING AND PROCEDURES  Imaging and Procedures for _0 @  OCT, Retina - OU - Both Eyes       Right Eye Quality was good. Central Foveal Thickness: 250. Progression has been stable. Findings include normal foveal contour, no SRF, no IRF, outer retinal atrophy (Focal ellipsoid disruption superior to fovea -- persistent).   Left Eye Quality was  good. Central Foveal Thickness: 335. Progression has improved. Findings include no SRF, normal foveal contour, intraretinal fluid (Minimal cystic changes).   Notes *Images captured and stored on drive  Diagnosis / Impression:  OD: NFP, No IRF/SRF; Focal ellipsoid disruption  superior to fovea -- persistent OS: Mac hole closed beautifully; mild interval improvement in temporal cystic changes  Clinical management:  See below  Abbreviations: NFP - Normal foveal profile. CME - cystoid macular edema. PED - pigment epithelial detachment. IRF - intraretinal fluid. SRF - subretinal fluid. EZ - ellipsoid zone. ERM - epiretinal membrane. ORA - outer retinal atrophy. ORT - outer retinal tubulation. SRHM - subretinal hyper-reflective material                 ASSESSMENT/PLAN:    ICD-10-CM   1. Macular hole of left eye  H35.342   2. Retinal edema  H35.81 OCT, Retina - OU - Both Eyes  3. Cystoid macular edema of left eye  H35.352   4. Essential hypertension  I10   5. Hypertensive retinopathy of both eyes  H35.033   6. 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 Pseudophakia  Z96.1   8. Dry eyes  H04.123   9. Combined forms of age-related cataract of both eyes  H25.813     1,2,3. Full Thickness Macular Hole OS  - preop BCVA 20/100 w/ central scotoma OS  - s/p PPV/ICG/Membrane peel/C3F8, 01.02.20  - doing well with macular hole closed and ellipsoid signal improved back to baseline essentially  - BCVA: 20/30-- (improved post cataract surgery in June 2020)  - interval improvement in focal cystic changes/CME temporal macula -- essentially resolved  - recommend taper PF tid for 2 weeks, bid for 2 weeks, then once daily for 1 week, then stop  - stop prolensa  - IOP 13 OS  - F/U 6 weeks -- CME check -- DFE/OCT  4,5. Hypertensive retinopathy OU  - discussed importance of tight BP control  - monitor  6. 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  - discussed diagnosis and progression  - under the expert management of Dr. Shirleen Schirmer  7. Pseudophakia OS  - s/p CE/IOL OS (June 2020, C. [facility])  - beautiful surgery with IOL in excellent position  - tr  CME as discussed above  - use PF as instructed above, D/C prolensa  - monitor  8. Dry eyes OU - recommend artificial tears and lubricating ointment as needed (every 1-2 hours is fine) - currently on Xiidra   Ophthalmic Meds Ordered this visit:  No orders of the defined types were placed in this encounter.      Return in about 6 weeks (around 05/27/2019) for DFE, OCT.  There are no Patient Instructions on file for this visit.   Explained the diagnoses, plan, and follow up with the patient and they expressed understanding.  Patient expressed understanding of the importance of proper follow up care.   Electronically signed by: Leeann Must, COA  Gardiner Sleeper, M.D., Ph.D. Diseases & Surgery of the Retina and Vitreous [facility]  I have reviewed the above documentation for accuracy and completeness, and I agree with the above. Gardiner Sleeper, M.D., Ph.D. 04/18/19 12:29 AM   Abbreviations: M myopia (nearsighted); A astigmatism; H hyperopia (farsighted); P presbyopia; Mrx spectacle prescription;  CTL contact lenses; OD right eye; OS  left eye; OU both eyes  XT exotropia; ET esotropia; PEK punctate epithelial keratitis; PEE punctate epithelial erosions; DES dry eye syndrome; MGD meibomian gland dysfunction; ATs artificial tears; PFAT's preservative free artificial tears; [place] nuclear sclerotic cataract; PSC posterior subcapsular cataract; ERM epi-retinal membrane; PVD posterior vitreous detachment; RD retinal detachment; DM diabetes mellitus; DR diabetic retinopathy; NPDR non-proliferative diabetic retinopathy; PDR proliferative diabetic retinopathy; CSME clinically significant macular edema; DME diabetic macular edema; dbh dot blot hemorrhages; CWS cotton wool spot; POAG primary open angle glaucoma; C/D cup-to-disc ratio; HVF humphrey visual field; GVF goldmann visual field; OCT optical coherence tomography; IOP intraocular pressure; BRVO Branch retinal vein  occlusion; CRVO central retinal vein occlusion; CRAO central retinal artery occlusion; BRAO branch retinal artery occlusion; RT retinal tear; SB scleral buckle; PPV pars plana vitrectomy; VH Vitreous hemorrhage; PRP panretinal laser photocoagulation; IVK intravitreal kenalog; VMT vitreomacular traction; MH Macular hole;  NVD neovascularization of the disc; NVE neovascularization elsewhere; AREDS age related eye disease study; ARMD age related macular degeneration; POAG primary open angle glaucoma; EBMD epithelial/anterior basement membrane dystrophy; ACIOL anterior chamber intraocular lens; IOL intraocular lens; PCIOL posterior chamber intraocular lens; Phaco/IOL phacoemulsification with intraocular lens placement; [place] photorefractive keratectomy; LASIK laser assisted in situ keratomileusis; HTN hypertension; DM diabetes mellitus; COPD chronic obstructive pulmonary disease

## 2019-04-18 ENCOUNTER — Encounter (INDEPENDENT_AMBULATORY_CARE_PROVIDER_SITE_OTHER): Payer: Self-pay | Admitting: Ophthalmology

## 2019-05-27 ENCOUNTER — Encounter (INDEPENDENT_AMBULATORY_CARE_PROVIDER_SITE_OTHER): Payer: Medicare Other | Admitting: Ophthalmology

## 2019-05-27 NOTE — Progress Notes (Addendum)
[facility] Note  05/29/2019     CHIEF COMPLAINT Patient presents for Retina Follow Up   HISTORY OF PRESENT ILLNESS: Dominique Lee is a 68 y.o. female who presents to the clinic today for:   HPI    Retina Follow Up    Patient presents with  Other.  In left eye.  This started months ago.  Severity is moderate.  Duration of 6 weeks.  Since onset it is gradually improving.  I, the attending physician,  performed the HPI with the patient and updated documentation appropriately.          Comments    68 y/o female pt here for 6 wk f/u for CME OS.  VA OS may be slightly improved.  No change in [place] OD.  Denies pain, flashes, floaters.  Theratears prn OU.  Xiidra QHS OU.       Last edited by Bernarda Caffey, MD on 06/01/2019 10:29 PM. (History)    pt states she completed the PF taper, she is now only using Thera tears and Shirley Friar, she states she is only using Xiidra at night bc it makes her vision very blurry, pt will see Dr. Katy Fitch in January and talk to him about it at that time   Referring physician: No referring provider defined for this encounter.  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 Cotter) [BCVA: OD: 20/40- OS: 20/100-] Ocular Hx-central serous chorioretinopathy, DES PMH-arrthymia, HLD, Rheumatoid Arthritis, Thyroid disease    CURRENT MEDICATIONS: Current Outpatient Medications (Ophthalmic Drugs)  Medication Sig  . bacitracin-polymyxin b (POLYSPORIN) ophthalmic ointment Place into the left eye 4 (four) times daily. Place a 1/2 inch ribbon of ointment into the lower eyelid. (Patient not taking: Reported on 04/15/2019)  . brimonidine (ALPHAGAN) 0.2 % ophthalmic solution Place 1 drop into the left eye 2 (two) times daily. (Patient not taking: Reported on 04/15/2019)  . Bromfenac Sodium 0.07 % SOLN Place 1 drop into the left  eye 4 (four) times daily. (Patient not taking: Reported on 04/15/2019)  . ketorolac (ACULAR) 0.5 % ophthalmic solution Place 1 drop into the left eye 4 (four) times daily. (Patient not taking: Reported on 04/15/2019)  . prednisoLONE acetate (PRED FORTE) 1 % ophthalmic suspension Place 1 drop into the left eye 4 (four) times daily.  Marland Kitchen Propylene Glycol (SYSTANE COMPLETE) 0.6 % SOLN Place 1 drop into both eyes 3 (three) times daily as needed (dry/irritated eyes.).  Marland Kitchen XIIDRA 5 % SOLN Apply 1 drop to eye 2 (two) times daily.   No current facility-administered medications for this visit. (Ophthalmic Drugs)   Current Outpatient Medications (Other)  Medication Sig  . acetaminophen (TYLENOL) 500 MG tablet Take 1,000 mg by mouth at bedtime.  . Cholecalciferol (VITAMIN D-1000 MAX ST) 25 MCG (1000 UT) tablet Take 1,000 Units by mouth daily with lunch.  . cyclobenzaprine (FEXMID) 7.5 MG tablet Take 1 tablet (7.5 mg total) by mouth 3 (three) times daily as needed for muscle spasms.  Marland Kitchen enoxaparin (LOVENOX) 40 MG/0.4ML injection Inject 0.4 mLs (40 mg total) into the skin daily.  . fluticasone (FLONASE) 50 MCG/ACT nasal spray Place 1 spray into both nostrils daily as needed for allergies or rhinitis.  . folic acid (FOLVITE) 1 MG tablet Take 1 mg by mouth daily with lunch.   . ibuprofen (ADVIL,MOTRIN) 200 MG tablet Take 1 tablet (200 mg  total) by mouth 3 (three) times daily.  Marland Kitchen leflunomide (ARAVA) 10 MG tablet Take 10 mg by mouth daily with lunch.   . levothyroxine (SYNTHROID, LEVOTHROID) 75 MCG tablet Take 75 mcg by mouth daily before breakfast.   . methotrexate 2.5 MG tablet Take 15 mg by mouth every Thursday. Thursdays with evening meal  . metoprolol succinate (TOPROL-XL) 50 MG 24 hr tablet Take 1-1.5 tablets (50-75 mg total) by mouth daily. (Patient taking differently: Take 50 mg by mouth at bedtime. )  . omeprazole (PRILOSEC) 20 MG capsule Take 1 capsule (20 mg total) by mouth daily for 14 days. (Patient not  taking: Reported on 06/08/2018)  . ondansetron (ZOFRAN) 4 MG tablet   . polyethylene glycol (MIRALAX / GLYCOLAX) packet Take 17 g by mouth daily as needed for mild constipation.   No current facility-administered medications for this visit. (Other)      REVIEW OF SYSTEMS: ROS    Positive for: Musculoskeletal, Cardiovascular, Eyes, Respiratory   Negative for: Constitutional, Gastrointestinal, Neurological, Skin, Genitourinary, HENT, Endocrine, Psychiatric, Allergic/Imm, Heme/Lymph   Last edited by Matthew Folks, COA on 05/29/2019  9:23 AM. (History)       ALLERGIES Allergies  Allergen Reactions  . Codeine Other (See Comments)    Agitation   . Morphine And Related Other (See Comments)    agitation   . Statins Other (See Comments)    Joint  pain    PAST MEDICAL HISTORY Past Medical History:  Diagnosis Date  . Cancer (HCC)    Basal cell- skin  . Cataract    OD  . Complication of anesthesia   . COPD (chronic obstructive pulmonary disease) ([place])   . Dysrhythmia    [name]  . Family history of adverse reaction to anesthesia    Mother N/V  . Fibromyalgia   . High cholesterol   . Hypertensive retinopathy    OU  . Hypothyroidism   . Irregular heart beat   . Osteoarthritis   . Pneumothorax after biopsy   . PONV (postoperative nausea and vomiting)   . [name] (premature ventricular contraction)   . RA (rheumatoid arthritis) ([place])   . Thyroid disease    Hypothyroid   Past Surgical History:  Procedure Laterality Date  . [place] VITRECTOMY WITH 20 GAUGE MVR PORT FOR MACULAR HOLE Left 06/21/2018   Procedure: 25 GAUGE PARS PLANA VITRECTOMY WITH 20 GAUGE MVR PORT FOR MACULAR HOLE;  Surgeon: Bernarda Caffey, MD;  Location: [facility];  Service: Ophthalmology;  Laterality: Left;  . ABDOMINAL HYSTERECTOMY  2000  . BREAST SURGERY     saline implants  . CARDIOVASCULAR STRESS TEST  09/02/2005   Cardiolite Myocardial perfusion study; NegativeBruce protocol exercise stress test  .  CATARACT EXTRACTION Left 11/2018   Dr. Midge Aver  . EYE SURGERY Left    macular hole repair  . GAS/FLUID EXCHANGE Left 06/21/2018   Procedure: GAS/FLUID EXCHANGE;  Surgeon: Bernarda Caffey, MD;  Location: [facility];  Service: Ophthalmology;  Laterality: Left;  C3F8  . HIP PINNING,CANNULATED Right 12/18/2017   Procedure: CANNULATED HIP PINNING;  Surgeon: Hiram Gash, MD;  Location: [facility];  Service: Orthopedics;  Laterality: Right;  . LAPAROSCOPIC PARTIAL COLECTOMY Left 05/18/2017  . Lung Biospy    . MEMBRANE PEEL Left 06/21/2018   Procedure: MEMBRANE PEEL;  Surgeon: Bernarda Caffey, MD;  Location: [facility];  Service: Ophthalmology;  Laterality: Left;  . NASAL SINUS SURGERY    . PHOTOCOAGULATION WITH LASER Left 06/21/2018  Procedure: PHOTOCOAGULATION WITH LASER;  Surgeon: Bernarda Caffey, MD;  Location: [facility];  Service: Ophthalmology;  Laterality: Left;  . TUBAL LIGATION    . US ECHOCARDIOGRAPHY  12/26/2011   mild abnormalities    FAMILY HISTORY Family History  Problem Relation Age of Onset  . Hypertension Mother   . Cancer - Lung Father 86       deceased  . Heart attack Maternal Grandmother   . Cancer Maternal Grandfather   . Diabetes Paternal Grandfather   . Diabetes Brother   . Cancer - Colon Sister     SOCIAL HISTORY Social History   Tobacco Use  . Smoking status: Former Smoker    Types: Cigarettes    Quit date: 11/04/2002    Years since quitting: 16.5  . Smokeless tobacco: Never Used  Substance Use Topics  . Alcohol use: No  . Drug use: No         OPHTHALMIC EXAM:  Base Eye Exam    Visual Acuity (Snellen - Linear)      Right Left   Dist cc 20/20 -2 20/30 +2   Dist ph cc  NI   Correction: Glasses       Tonometry (Tonopen, 9:25 AM)      Right Left   Pressure 12 13       Pupils      Dark Light Shape React APD   Right 3 2 Round Brisk None   Left 4 3 Round Minimal None       Visual Fields (Counting fingers)      Left Right    Full Full       Extraocular Movement       Right Left    Full, Ortho Full, Ortho       Neuro/Psych    Oriented x3: Yes   Mood/Affect: Normal       Dilation    Both eyes: 1.0% Mydriacyl, 2.5% Phenylephrine @ 9:25 AM        Slit Lamp and Fundus Exam    Slit Lamp Exam      Right Left   Lids/Lashes Dermatochalasis - upper lid Dermatochalasis - upper lid, Meibomian gland dysfunction   Conjunctiva/Sclera White and quiet White and quiet   Cornea 2+ Punctate epithelial erosions inferiorly arcus, 3+ diffuse Punctate epithelial erosions, endo pigment inferiorly, Well healed cataract wounds, Keratic precipitates   Anterior Chamber Deep and clear Deep, 0.5+cell/pigment   Iris Round and dilated Round and well dilated   Lens 2+ Nuclear sclerosis, 2+ Cortical cataract PC IOL in excellent position, trace PCO   Vitreous Vitreous syneresis, Posterior vitreous detachment post vitrectomy; trace cell/pigmenyt       Fundus Exam      Right Left   Disc Pink and Sharp trace pallor, sharp rim   C/D Ratio 0.5 0.5   Macula Flat, Blunted foveal reflex, RPE mottling and clumping, No heme or edema flat; mac hole closed, good foveal reflex, trace cystic changes temporal fovea, trace Retinal pigment epithelial mottling, No heme   Vessels Vascular attenuation, AV crossing changes Vascular attenuation, Tortuous   Periphery Attached, No RT/RD Attached, good laser 360          IMAGING AND PROCEDURES  Imaging and Procedures for _0 @  OCT, Retina - OU - Both Eyes       Right Eye Quality was good. Central Foveal Thickness: 250. Progression has been stable. Findings include normal foveal contour, no SRF, no IRF, outer retinal atrophy (Focal ellipsoid  disruption superior to fovea -- persistent).   Left Eye Quality was good. Central Foveal Thickness: 343. Progression has worsened. Findings include no SRF, normal foveal contour, intraretinal fluid (Trace interval increase in cystic changes).   Notes *Images captured and stored on  drive  Diagnosis / Impression:  OD: NFP, No IRF/SRF; Focal ellipsoid disruption superior to fovea -- persistent OS: Mac hole closed beautifully; Trace interval increase in cystic changes  Clinical management:  See below  Abbreviations: NFP - Normal foveal profile. CME - cystoid macular edema. PED - pigment epithelial detachment. IRF - intraretinal fluid. SRF - subretinal fluid. EZ - ellipsoid zone. ERM - epiretinal membrane. ORA - outer retinal atrophy. ORT - outer retinal tubulation. SRHM - subretinal hyper-reflective material                 ASSESSMENT/PLAN:    ICD-10-CM   1. Macular hole of left eye  H35.342   2. Retinal edema  H35.81 OCT, Retina - OU - Both Eyes  3. Cystoid macular edema of left eye  H35.352   4. Essential hypertension  I10   5. Hypertensive retinopathy of both eyes  H35.033   6. 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 Pseudophakia  Z96.1   8. Dry eyes  H04.123   9. Combined forms of age-related cataract of both eyes  H25.813     1,2,3. Full Thickness Macular Hole OS  - preop BCVA 20/100 w/ central scotoma OS  - s/p PPV/ICG/Membrane peel/C3F8, 01.02.20  - doing well with macular hole closed and ellipsoid signal improved back to baseline essentially  - BCVA: 20/30-- (improved post cataract surgery in June 2020)  - completed PF taper  - trace interval increase in cystic changes  - IOP 13 OS  - F/U 6 weeks -- CME check -- DFE/OCT  4,5. Hypertensive retinopathy OU  - discussed importance of tight BP control  - monitor  6. 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  - discussed diagnosis and progression  - under the expert management of Dr. Shirleen Schirmer  7. Pseudophakia OS  - s/p CE/IOL OS (June 2020, C. [facility])  - beautiful surgery with IOL in excellent position  - tr CME as discussed above  - use PF as instructed above  -  Prolensa stopped  - monitor  8. Dry eyes OU  - recommend artificial tears and lubricating ointment as needed (every 1-2 hours is fine)  - currently on Xiidra   Ophthalmic Meds Ordered this visit:  No orders of the defined types were placed in this encounter.      Return in about 6 weeks (around 07/10/2019) for f/u CME check OS, DFE, OCT.  There are no Patient Instructions on file for this visit.   This document serves as a record of services personally performed by Gardiner Sleeper, MD, PhD. It was created on their behalf by Roselee Nova, COMT. The creation of this record is the provider's dictation and/or activities during the visit.  Electronically signed by: Roselee Nova, COMT 06/01/19 10:31 PM  Gardiner Sleeper, M.D., Ph.D. Diseases & Surgery of the Retina and [place] 05/29/2019   I have reviewed the above documentation for accuracy and completeness, and I agree with the above. Gardiner Sleeper, M.D., Ph.D. 06/01/19 10:31 PM     Abbreviations: M myopia (nearsighted); A  astigmatism; H hyperopia (farsighted); P presbyopia; Mrx spectacle prescription;  CTL contact lenses; OD right eye; OS left eye; OU both eyes  XT exotropia; ET esotropia; PEK punctate epithelial keratitis; PEE punctate epithelial erosions; DES dry eye syndrome; MGD meibomian gland dysfunction; ATs artificial tears; PFAT's preservative free artificial tears; [place] nuclear sclerotic cataract; PSC posterior subcapsular cataract; ERM epi-retinal membrane; PVD posterior vitreous detachment; RD retinal detachment; DM diabetes mellitus; DR diabetic retinopathy; NPDR non-proliferative diabetic retinopathy; PDR proliferative diabetic retinopathy; CSME clinically significant macular edema; DME diabetic macular edema; dbh dot blot hemorrhages; CWS cotton wool spot; POAG primary open angle glaucoma; C/D cup-to-disc ratio; HVF humphrey visual field; GVF goldmann visual field; OCT optical coherence  tomography; IOP intraocular pressure; BRVO Branch retinal vein occlusion; CRVO central retinal vein occlusion; CRAO central retinal artery occlusion; BRAO branch retinal artery occlusion; RT retinal tear; SB scleral buckle; PPV pars plana vitrectomy; VH Vitreous hemorrhage; PRP panretinal laser photocoagulation; IVK intravitreal kenalog; VMT vitreomacular traction; MH Macular hole;  NVD neovascularization of the disc; NVE neovascularization elsewhere; AREDS age related eye disease study; ARMD age related macular degeneration; POAG primary open angle glaucoma; EBMD epithelial/anterior basement membrane dystrophy; ACIOL anterior chamber intraocular lens; IOL intraocular lens; PCIOL posterior chamber intraocular lens; Phaco/IOL phacoemulsification with intraocular lens placement; [place] photorefractive keratectomy; LASIK laser assisted in situ keratomileusis; HTN hypertension; DM diabetes mellitus; COPD chronic obstructive pulmonary disease

## 2019-05-29 ENCOUNTER — Encounter (INDEPENDENT_AMBULATORY_CARE_PROVIDER_SITE_OTHER): Payer: Self-pay | Admitting: Ophthalmology

## 2019-05-29 ENCOUNTER — Ambulatory Visit (INDEPENDENT_AMBULATORY_CARE_PROVIDER_SITE_OTHER): Payer: Medicare Other | Admitting: Ophthalmology

## 2019-05-29 DIAGNOSIS — I10 Essential (primary) hypertension: Secondary | ICD-10-CM | POA: Diagnosis not present

## 2019-05-29 DIAGNOSIS — H25811 Combined forms of age-related cataract, right eye: Secondary | ICD-10-CM

## 2019-05-29 DIAGNOSIS — H35352 Cystoid macular degeneration, left eye: Secondary | ICD-10-CM

## 2019-05-29 DIAGNOSIS — Z961 Presence of intraocular lens: Secondary | ICD-10-CM

## 2019-05-29 DIAGNOSIS — H3581 Retinal edema: Secondary | ICD-10-CM

## 2019-05-29 DIAGNOSIS — H35342 Macular cyst, hole, or pseudohole, left eye: Secondary | ICD-10-CM | POA: Diagnosis not present

## 2019-05-29 DIAGNOSIS — H25813 Combined forms of age-related cataract, bilateral: Secondary | ICD-10-CM

## 2019-05-29 DIAGNOSIS — H35033 Hypertensive retinopathy, bilateral: Secondary | ICD-10-CM

## 2019-05-29 DIAGNOSIS — H04123 Dry eye syndrome of bilateral lacrimal glands: Secondary | ICD-10-CM

## 2019-07-03 NOTE — Progress Notes (Signed)
[facility] Note  07/10/2019     CHIEF COMPLAINT Patient presents for Retina Follow Up   HISTORY OF PRESENT ILLNESS: Dominique Lee is a 69 y.o. female who presents to the clinic today for:   HPI    Retina Follow Up    Patient presents with  Other.  In left eye.  This started 6 weeks ago.  Severity is moderate.  I, the attending physician,  performed the HPI with the patient and updated documentation appropriately.          Comments    Patient here for 6 weeks retina follow up for CME OS. Patient states vision about the same. No eye pain. Has some dryness. Taken off Briggs and put on Restasis.        Last edited by Bernarda Caffey, MD on 07/10/2019  9:56 AM. (History)    pt states her vision seems about the same, she states she saw Dr. Katy Fitch last week and he took her off of Shirley Friar and put her on Restasis, she states the Restasis is working much better  Referring physician: No referring provider defined for this encounter.  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 Cotter) [BCVA: OD: 20/40- OS: 20/100-] Ocular Hx-central serous chorioretinopathy, DES PMH-arrthymia, HLD, Rheumatoid Arthritis, Thyroid disease    CURRENT MEDICATIONS: Current Outpatient Medications (Ophthalmic Drugs)  Medication Sig  . bacitracin-polymyxin b (POLYSPORIN) ophthalmic ointment Place into the left eye 4 (four) times daily. Place a 1/2 inch ribbon of ointment into the lower eyelid. (Patient not taking: Reported on 04/15/2019)  . brimonidine (ALPHAGAN) 0.2 % ophthalmic solution Place 1 drop into the left eye 2 (two) times daily. (Patient not taking: Reported on 04/15/2019)  . bromfenac (XIBROM) 0.09 % ophthalmic solution INSTILL 1 DROP OF SOLUTION INTO LEFT EYE TWICE DAILY  . ketorolac (ACULAR) 0.5 % ophthalmic solution Place 1 drop into the left eye 4  (four) times daily. (Patient not taking: Reported on 04/15/2019)  . prednisoLONE acetate (PRED FORTE) 1 % ophthalmic suspension Place 1 drop into the left eye 2 (two) times daily.  Marland Kitchen Propylene Glycol (SYSTANE COMPLETE) 0.6 % SOLN Place 1 drop into both eyes 3 (three) times daily as needed (dry/irritated eyes.).  Marland Kitchen XIIDRA 5 % SOLN Apply 1 drop to eye 2 (two) times daily.   No current facility-administered medications for this visit. (Ophthalmic Drugs)   Current Outpatient Medications (Other)  Medication Sig  . acetaminophen (TYLENOL) 500 MG tablet Take 1,000 mg by mouth at bedtime.  . Cholecalciferol (VITAMIN D-1000 MAX ST) 25 MCG (1000 UT) tablet Take 1,000 Units by mouth daily with lunch.  . cyclobenzaprine (FEXMID) 7.5 MG tablet Take 1 tablet (7.5 mg total) by mouth 3 (three) times daily as needed for muscle spasms.  Marland Kitchen enoxaparin (LOVENOX) 40 MG/0.4ML injection Inject 0.4 mLs (40 mg total) into the skin daily.  . fluticasone (FLONASE) 50 MCG/ACT nasal spray Place 1 spray into both nostrils daily as needed for allergies or rhinitis.  . folic acid (FOLVITE) 1 MG tablet Take 1 mg by mouth daily with lunch.   . ibuprofen (ADVIL,MOTRIN) 200 MG tablet Take 1 tablet (200 mg total) by mouth 3 (three) times daily.  Marland Kitchen leflunomide (ARAVA) 10 MG tablet Take 10 mg by mouth daily with lunch.   . levothyroxine (SYNTHROID, LEVOTHROID) 75 MCG tablet Take 75 mcg by mouth  daily before breakfast.   . methotrexate 2.5 MG tablet Take 15 mg by mouth every Thursday. Thursdays with evening meal  . metoprolol succinate (TOPROL-XL) 50 MG 24 hr tablet Take 1-1.5 tablets (50-75 mg total) by mouth daily. (Patient taking differently: Take 50 mg by mouth at bedtime. )  . omeprazole (PRILOSEC) 20 MG capsule Take 1 capsule (20 mg total) by mouth daily for 14 days. (Patient not taking: Reported on 06/08/2018)  . ondansetron (ZOFRAN) 4 MG tablet   . polyethylene glycol (MIRALAX / GLYCOLAX) packet Take 17 g by mouth daily as  needed for mild constipation.   No current facility-administered medications for this visit. (Other)      REVIEW OF SYSTEMS: ROS    Positive for: Musculoskeletal, Cardiovascular, Eyes, Respiratory   Negative for: Constitutional, Gastrointestinal, Neurological, Skin, Genitourinary, HENT, Endocrine, Psychiatric, Allergic/Imm, Heme/Lymph   Last edited by Theodore Demark, COA on 07/10/2019  9:24 AM. (History)       ALLERGIES Allergies  Allergen Reactions  . Codeine Other (See Comments)    Agitation   . Morphine And Related Other (See Comments)    agitation   . Statins Other (See Comments)    Joint  pain    PAST MEDICAL HISTORY Past Medical History:  Diagnosis Date  . Cancer (HCC)    Basal cell- skin  . Cataract    OD  . Complication of anesthesia   . COPD (chronic obstructive pulmonary disease) ([place])   . Dysrhythmia    [name]  . Family history of adverse reaction to anesthesia    Mother N/V  . Fibromyalgia   . High cholesterol   . Hypertensive retinopathy    OU  . Hypothyroidism   . Irregular heart beat   . Osteoarthritis   . Pneumothorax after biopsy   . PONV (postoperative nausea and vomiting)   . [name] (premature ventricular contraction)   . RA (rheumatoid arthritis) ([place])   . Thyroid disease    Hypothyroid   Past Surgical History:  Procedure Laterality Date  . [place] VITRECTOMY WITH 20 GAUGE MVR PORT FOR MACULAR HOLE Left 06/21/2018   Procedure: 25 GAUGE PARS PLANA VITRECTOMY WITH 20 GAUGE MVR PORT FOR MACULAR HOLE;  Surgeon: Bernarda Caffey, MD;  Location: [facility];  Service: Ophthalmology;  Laterality: Left;  . ABDOMINAL HYSTERECTOMY  2000  . BREAST SURGERY     saline implants  . CARDIOVASCULAR STRESS TEST  09/02/2005   Cardiolite Myocardial perfusion study; NegativeBruce protocol exercise stress test  . CATARACT EXTRACTION Left 11/2018   Dr. Midge Aver  . EYE SURGERY Left    macular hole repair  . GAS/FLUID EXCHANGE Left 06/21/2018   Procedure:  GAS/FLUID EXCHANGE;  Surgeon: Bernarda Caffey, MD;  Location: [facility];  Service: Ophthalmology;  Laterality: Left;  C3F8  . HIP PINNING,CANNULATED Right 12/18/2017   Procedure: CANNULATED HIP PINNING;  Surgeon: Hiram Gash, MD;  Location: [facility];  Service: Orthopedics;  Laterality: Right;  . LAPAROSCOPIC PARTIAL COLECTOMY Left 05/18/2017  . Lung Biospy    . MEMBRANE PEEL Left 06/21/2018   Procedure: MEMBRANE PEEL;  Surgeon: Bernarda Caffey, MD;  Location: [facility];  Service: Ophthalmology;  Laterality: Left;  . NASAL SINUS SURGERY    . PHOTOCOAGULATION WITH LASER Left 06/21/2018   Procedure: PHOTOCOAGULATION WITH LASER;  Surgeon: Bernarda Caffey, MD;  Location: [facility];  Service: Ophthalmology;  Laterality: Left;  . TUBAL LIGATION    . US ECHOCARDIOGRAPHY  12/26/2011   mild abnormalities  FAMILY HISTORY Family History  Problem Relation Age of Onset  . Hypertension Mother   . Cancer - Lung Father 99       deceased  . Heart attack Maternal Grandmother   . Cancer Maternal Grandfather   . Diabetes Paternal Grandfather   . Diabetes Brother   . Cancer - Colon Sister     SOCIAL HISTORY Social History   Tobacco Use  . Smoking status: Former Smoker    Types: Cigarettes    Quit date: 11/04/2002    Years since quitting: 16.7  . Smokeless tobacco: Never Used  Substance Use Topics  . Alcohol use: No  . Drug use: No         OPHTHALMIC EXAM:  Base Eye Exam    Visual Acuity (Snellen - Linear)      Right Left   Dist cc 20/20 -1 20/30 -2   Dist ph cc  NI   Correction: Glasses       Tonometry (Tonopen, 9:20 AM)      Right Left   Pressure 17 10       Pupils      Dark Light Shape React APD   Right 3 2 Round Brisk None   Left 4 3 Round Minimal None       Visual Fields (Counting fingers)      Left Right    Full Full       Extraocular Movement      Right Left    Full, Ortho Full, Ortho       Neuro/Psych    Oriented x3: Yes   Mood/Affect: Normal       Dilation    Both eyes: 1.0%  Mydriacyl, 2.5% Phenylephrine @ 9:20 AM        Slit Lamp and Fundus Exam    Slit Lamp Exam      Right Left   Lids/Lashes Dermatochalasis - upper lid Dermatochalasis - upper lid, Meibomian gland dysfunction   Conjunctiva/Sclera White and quiet White and quiet   Cornea 1-2+ Punctate epithelial erosions  arcus, 2-3+ diffuse Punctate epithelial erosions, Well healed cataract wounds   Anterior Chamber Deep and quiet Deep, 1+cell/pigment   Iris Round and dilated Round and well dilated   Lens 2+ Nuclear sclerosis, 2+ Cortical cataract PC IOL in excellent position, trace PCO   Vitreous Vitreous syneresis, Posterior vitreous detachment post vitrectomy; 0.5+cell/pigment       Fundus Exam      Right Left   Disc Pink and Sharp trace pallor, sharp rim   C/D Ratio 0.5 0.5   Macula Flat, Blunted foveal reflex, RPE mottling and clumping, No heme or edema flat; mac hole closed, good foveal reflex, mild interval increase in trace cystic changes centrally, trace Retinal pigment epithelial mottling, No heme   Vessels Vascular attenuation, AV crossing changes, mild Tortuousity Vascular attenuation, Tortuous   Periphery Attached, No RT/RD Attached, good laser 360        Refraction    Wearing Rx      Sphere Cylinder Axis Add   Right +0.75 +1.25 028 +2.25   Left -0.25 +1.00 028 +2.25          IMAGING AND PROCEDURES  Imaging and Procedures for _0 @  OCT, Retina - OU - Both Eyes       Right Eye Quality was good. Central Foveal Thickness: 259. Progression has been stable. Findings include normal foveal contour, no SRF, no IRF, outer retinal atrophy (Focal ellipsoid disruption superior to  fovea -- improving).   Left Eye Quality was good. Central Foveal Thickness: 373. Progression has worsened. Findings include no SRF, normal foveal contour, intraretinal fluid (Mild interval increase in cystic change).   Notes *Images captured and stored on drive  Diagnosis / Impression:  OD: NFP, No  IRF/SRF; Focal ellipsoid disruption superior to fovea -- improving OS: Mac hole closed beautifully; Mild interval increase in cystic changes  Clinical management:  See below  Abbreviations: NFP - Normal foveal profile. CME - cystoid macular edema. PED - pigment epithelial detachment. IRF - intraretinal fluid. SRF - subretinal fluid. EZ - ellipsoid zone. ERM - epiretinal membrane. ORA - outer retinal atrophy. ORT - outer retinal tubulation. SRHM - subretinal hyper-reflective material                 ASSESSMENT/PLAN:    ICD-10-CM   1. Macular hole of left eye  H35.342   2. Retinal edema  H35.81 OCT, Retina - OU - Both Eyes  3. Cystoid macular edema of left eye  H35.352   4. Essential hypertension  I10   5. Hypertensive retinopathy of both eyes  H35.033   6. 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 Pseudophakia  Z96.1   8. Dry eyes  H04.123   9. Combined forms of age-related cataract of both eyes  H25.813     1,2,3. Full Thickness Macular Hole OS  - preop BCVA 20/100 w/ central scotoma OS  - s/p PPV/ICG/Membrane peel/C3F8, 01.02.20  - doing well with macular hole closed and ellipsoid signal improved back to baseline essentially  - BCVA: 20/30-- (improved post cataract surgery in June 2020)  - trace interval increase in cystic changes with tapering of PF and Prolensa  - appears pt may require low dose maintenance treatment to reduce CME / cystic changes -- PF/Prolensa BID OS  - IOP 10 OS  - F/U 3 months -- CME check -- DFE/OCT  4,5. Hypertensive retinopathy OU  - discussed importance of tight BP control  - monitor  6. 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  - discussed diagnosis and progression  - under the expert management of Dr. Shirleen Schirmer  7. Pseudophakia OS  - s/p CE/IOL OS (June 2020, C. Groat)  - beautiful surgery with IOL in excellent  position  - tr CME/cystic changes as discussed above  - due to second recurrence of CME, recommend long term mainteancne use of Pred Forte and Prolensa -- BID OS  - monitor  8. Dry eyes OU  - recommend artificial tears and lubricating ointment as needed (every 1-2 hours is fine)  - stopped xiidra -- now on Restasis   Ophthalmic Meds Ordered this visit:  Meds ordered this encounter  Medications  . prednisoLONE acetate (PRED FORTE) 1 % ophthalmic suspension    Sig: Place 1 drop into the left eye 2 (two) times daily.    Dispense:  15 mL    Refill:  3  . DISCONTD: Bromfenac Sodium 0.07 % SOLN    Sig: Place 1 drop into the left eye 2 (two) times daily.    Dispense:  6 mL    Refill:  3       Return in about 3 months (around 10/08/2019) for f/u CME OS, DFE, OCT.  There are no Patient Instructions on file for this visit.   This document serves as a  record of services personally performed by Gardiner Sleeper, MD, PhD. It was created on their behalf by Roselee Nova, COMT. The creation of this record is the provider's dictation and/or activities during the visit.  Electronically signed by: Roselee Nova, COMT 07/14/19 2:57 AM   This document serves as a record of services personally performed by Gardiner Sleeper, MD, PhD. It was created on their behalf by Ernest Mallick, OA, an ophthalmic assistant. The creation of this record is the provider's dictation and/or activities during the visit.    Electronically signed by: Ernest Mallick, OA 01.20.2021 2:57 AM   Gardiner Sleeper, M.D., Ph.D. Diseases & Surgery of the Retina and [place] 07/10/2019   I have reviewed the above documentation for accuracy and completeness, and I agree with the above. Gardiner Sleeper, M.D., Ph.D. 07/14/19 2:57 AM   Abbreviations: M myopia (nearsighted); A astigmatism; H hyperopia (farsighted); P presbyopia; Mrx spectacle prescription;  CTL contact lenses; OD right eye; OS left  eye; OU both eyes  XT exotropia; ET esotropia; PEK punctate epithelial keratitis; PEE punctate epithelial erosions; DES dry eye syndrome; MGD meibomian gland dysfunction; ATs artificial tears; PFAT's preservative free artificial tears; [place] nuclear sclerotic cataract; PSC posterior subcapsular cataract; ERM epi-retinal membrane; PVD posterior vitreous detachment; RD retinal detachment; DM diabetes mellitus; DR diabetic retinopathy; NPDR non-proliferative diabetic retinopathy; PDR proliferative diabetic retinopathy; CSME clinically significant macular edema; DME diabetic macular edema; dbh dot blot hemorrhages; CWS cotton wool spot; POAG primary open angle glaucoma; C/D cup-to-disc ratio; HVF humphrey visual field; GVF goldmann visual field; OCT optical coherence tomography; IOP intraocular pressure; BRVO Branch retinal vein occlusion; CRVO central retinal vein occlusion; CRAO central retinal artery occlusion; BRAO branch retinal artery occlusion; RT retinal tear; SB scleral buckle; PPV pars plana vitrectomy; VH Vitreous hemorrhage; PRP panretinal laser photocoagulation; IVK intravitreal kenalog; VMT vitreomacular traction; MH Macular hole;  NVD neovascularization of the disc; NVE neovascularization elsewhere; AREDS age related eye disease study; ARMD age related macular degeneration; POAG primary open angle glaucoma; EBMD epithelial/anterior basement membrane dystrophy; ACIOL anterior chamber intraocular lens; IOL intraocular lens; PCIOL posterior chamber intraocular lens; Phaco/IOL phacoemulsification with intraocular lens placement; [place] photorefractive keratectomy; LASIK laser assisted in situ keratomileusis; HTN hypertension; DM diabetes mellitus; COPD chronic obstructive pulmonary disease

## 2019-07-10 ENCOUNTER — Other Ambulatory Visit (INDEPENDENT_AMBULATORY_CARE_PROVIDER_SITE_OTHER): Payer: Self-pay | Admitting: Ophthalmology

## 2019-07-10 ENCOUNTER — Encounter (INDEPENDENT_AMBULATORY_CARE_PROVIDER_SITE_OTHER): Payer: Self-pay | Admitting: Ophthalmology

## 2019-07-10 ENCOUNTER — Ambulatory Visit (INDEPENDENT_AMBULATORY_CARE_PROVIDER_SITE_OTHER): Payer: Medicare Other | Admitting: Ophthalmology

## 2019-07-10 DIAGNOSIS — H3581 Retinal edema: Secondary | ICD-10-CM

## 2019-07-10 DIAGNOSIS — I10 Essential (primary) hypertension: Secondary | ICD-10-CM | POA: Diagnosis not present

## 2019-07-10 DIAGNOSIS — H35342 Macular cyst, hole, or pseudohole, left eye: Secondary | ICD-10-CM

## 2019-07-10 DIAGNOSIS — H35033 Hypertensive retinopathy, bilateral: Secondary | ICD-10-CM

## 2019-07-10 DIAGNOSIS — H04123 Dry eye syndrome of bilateral lacrimal glands: Secondary | ICD-10-CM

## 2019-07-10 DIAGNOSIS — Z961 Presence of intraocular lens: Secondary | ICD-10-CM

## 2019-07-10 DIAGNOSIS — H35352 Cystoid macular degeneration, left eye: Secondary | ICD-10-CM | POA: Diagnosis not present

## 2019-07-10 DIAGNOSIS — H25811 Combined forms of age-related cataract, right eye: Secondary | ICD-10-CM

## 2019-07-10 DIAGNOSIS — H25813 Combined forms of age-related cataract, bilateral: Secondary | ICD-10-CM

## 2019-07-10 MED ORDER — BROMFENAC SODIUM 0.07 % OP SOLN: 1.0000 [drp] | Freq: Two times a day (BID) | OPHTHALMIC | 3 refills | Status: DC

## 2019-07-10 MED ORDER — PREDNISOLONE ACETATE 1 % OP SUSP: 1.0000 [drp] | Freq: Two times a day (BID) | OPHTHALMIC | 3 refills | Status: DC

## 2019-10-07 NOTE — Progress Notes (Signed)
[facility] Note  10/08/2019     CHIEF COMPLAINT Patient presents for Retina Follow Up   HISTORY OF PRESENT ILLNESS: Dominique Lee is a 69 y.o. female who presents to the clinic today for:   HPI    Retina Follow Up    Patient presents with  Other.  In left eye.  This started weeks ago.  Severity is moderate.  Duration of weeks.  Since onset it is stable.  I, the attending physician,  performed the HPI with the patient and updated documentation appropriately.          Comments    Pt states her vision is about the same--still has distortion in center of VA OS.  Patient denies eye pain or discomfort and denies any new or worsening floaters or fol OU.       Last edited by Bernarda Caffey, MD on 10/08/2019  8:44 AM. (History)    pt feels like her vision is not "quite as sharp" in the left eye, she is using Prolensa/PF and Restasis BID as well as AT's PRN  Referring physician: Warden Fillers, MD [address] [place],  [place] [address]  HISTORICAL INFORMATION:   Selected notes from the MEDICAL RECORD NUMBER 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 bacitracin-polymyxin b (POLYSPORIN) ophthalmic ointment Place into the left eye 4 (four) times daily. Place a 1/2 inch ribbon of ointment into the lower eyelid. (Patient not taking: Reported on 04/15/2019)  . bromfenac (XIBROM) 0.09 % ophthalmic solution INSTILL 1 DROP OF SOLUTION INTO LEFT EYE TWICE DAILY  . Bromfenac Sodium (PROLENSA) 0.07 % SOLN Place 1 drop into the left eye 2 (two) times daily.  Marland Kitchen ketorolac (ACULAR) 0.5 % ophthalmic solution Place 1 drop into the left eye 4 (four) times daily. (Patient not taking: Reported on 04/15/2019)  . prednisoLONE acetate (PRED FORTE) 1 % ophthalmic suspension Place 1 drop into the left eye 2 (two) times daily.  Marland Kitchen Propylene  Glycol (SYSTANE COMPLETE) 0.6 % SOLN Place 1 drop into both eyes 3 (three) times daily as needed (dry/irritated eyes.).  Marland Kitchen XIIDRA 5 % SOLN Apply 1 drop to eye 2 (two) times daily.   No current facility-administered medications for this visit. (Ophthalmic Drugs)   Current Outpatient Medications (Other)  Medication Sig  . acetaminophen (TYLENOL) 500 MG tablet Take 1,000 mg by mouth at bedtime.  . Cholecalciferol (VITAMIN D-1000 MAX ST) 25 MCG (1000 UT) tablet Take 1,000 Units by mouth daily with lunch.  . cyclobenzaprine (FEXMID) 7.5 MG tablet Take 1 tablet (7.5 mg total) by mouth 3 (three) times daily as needed for muscle spasms.  Marland Kitchen enoxaparin (LOVENOX) 40 MG/0.4ML injection Inject 0.4 mLs (40 mg total) into the skin daily.  . fluticasone (FLONASE) 50 MCG/ACT nasal spray Place 1 spray into both nostrils daily as needed for allergies or rhinitis.  . folic acid (FOLVITE) 1 MG tablet Take 1 mg by mouth daily with lunch.   . ibuprofen (ADVIL,MOTRIN) 200 MG tablet Take 1 tablet (200 mg total) by mouth 3 (three) times daily.  Marland Kitchen leflunomide (ARAVA) 10 MG tablet Take 10 mg by mouth daily with lunch.   . levothyroxine (SYNTHROID, LEVOTHROID) 75 MCG tablet Take 75 mcg by mouth daily before breakfast.   . methotrexate 2.5 MG tablet Take 15 mg by mouth every Thursday. Thursdays with evening meal  .  metoprolol succinate (TOPROL-XL) 50 MG 24 hr tablet Take 1-1.5 tablets (50-75 mg total) by mouth daily. (Patient taking differently: Take 50 mg by mouth at bedtime. )  . omeprazole (PRILOSEC) 20 MG capsule Take 1 capsule (20 mg total) by mouth daily for 14 days. (Patient not taking: Reported on 06/08/2018)  . ondansetron (ZOFRAN) 4 MG tablet   . polyethylene glycol (MIRALAX / GLYCOLAX) packet Take 17 g by mouth daily as needed for mild constipation.   No current facility-administered medications for this visit. (Other)      REVIEW OF SYSTEMS: ROS    Positive for: Musculoskeletal, Cardiovascular, Eyes,  Respiratory   Negative for: Constitutional, Gastrointestinal, Neurological, Skin, Genitourinary, HENT, Endocrine, Psychiatric, Allergic/Imm, Heme/Lymph   Last edited by Doneen Poisson on 10/08/2019  8:23 AM. (History)       ALLERGIES Allergies  Allergen Reactions  . Codeine Other (See Comments)    Agitation   . Morphine And Related Other (See Comments)    agitation   . Statins Other (See Comments)    Joint  pain    PAST MEDICAL HISTORY Past Medical History:  Diagnosis Date  . Cancer (HCC)    Basal cell- skin  . Cataract    OD  . Complication of anesthesia   . COPD (chronic obstructive pulmonary disease) ([place])   . Dysrhythmia    [name]  . Family history of adverse reaction to anesthesia    Mother N/V  . Fibromyalgia   . High cholesterol   . Hypertensive retinopathy    OU  . Hypothyroidism   . Irregular heart beat   . Osteoarthritis   . Pneumothorax after biopsy   . PONV (postoperative nausea and vomiting)   . [name] (premature ventricular contraction)   . RA (rheumatoid arthritis) ([place])   . Thyroid disease    Hypothyroid   Past Surgical History:  Procedure Laterality Date  . [place] VITRECTOMY WITH 20 GAUGE MVR PORT FOR MACULAR HOLE Left 06/21/2018   Procedure: 25 GAUGE PARS PLANA VITRECTOMY WITH 20 GAUGE MVR PORT FOR MACULAR HOLE;  Surgeon: Bernarda Caffey, MD;  Location: [place];  Service: Ophthalmology;  Laterality: Left;  . ABDOMINAL HYSTERECTOMY  2000  . BREAST SURGERY     saline implants  . CARDIOVASCULAR STRESS TEST  09/02/2005   Cardiolite Myocardial perfusion study; NegativeBruce protocol exercise stress test  . CATARACT EXTRACTION Left 11/2018   Dr. Midge Aver  . EYE SURGERY Left    macular hole repair  . GAS/FLUID EXCHANGE Left 06/21/2018   Procedure: GAS/FLUID EXCHANGE;  Surgeon: Bernarda Caffey, MD;  Location: [place];  Service: Ophthalmology;  Laterality: Left;  C3F8  . HIP PINNING,CANNULATED Right 12/18/2017   Procedure: CANNULATED HIP PINNING;   Surgeon: Hiram Gash, MD;  Location: [place];  Service: Orthopedics;  Laterality: Right;  . LAPAROSCOPIC PARTIAL COLECTOMY Left 05/18/2017  . Lung Biospy    . MEMBRANE PEEL Left 06/21/2018   Procedure: MEMBRANE PEEL;  Surgeon: Bernarda Caffey, MD;  Location: [facility];  Service: Ophthalmology;  Laterality: Left;  . NASAL SINUS SURGERY    . PHOTOCOAGULATION WITH LASER Left 06/21/2018   Procedure: PHOTOCOAGULATION WITH LASER;  Surgeon: Bernarda Caffey, MD;  Location: [facility];  Service: Ophthalmology;  Laterality: Left;  . TUBAL LIGATION    . US ECHOCARDIOGRAPHY  12/26/2011   mild abnormalities    FAMILY HISTORY Family History  Problem Relation Age of Onset  . Hypertension Mother   . Cancer - Lung Father  71       deceased  . Heart attack Maternal Grandmother   . Cancer Maternal Grandfather   . Diabetes Paternal Grandfather   . Diabetes Brother   . Cancer - Colon Sister     SOCIAL HISTORY Social History   Tobacco Use  . Smoking status: Former Smoker    Types: Cigarettes    Quit date: 11/04/2002    Years since quitting: 16.9  . Smokeless tobacco: Never Used  Substance Use Topics  . Alcohol use: No  . Drug use: No         OPHTHALMIC EXAM:  Base Eye Exam    Visual Acuity (Snellen - Linear)      Right Left   Dist cc 20/20 -2 20/30 +2   Dist ph cc  NI   Correction: Glasses       Tonometry (Tonopen, 8:26 AM)      Right Left   Pressure 16 11       Pupils      Dark Light Shape React APD   Right 3 2 Round Brisk 0   Left 3 2 Round Brisk 0       Visual Fields      Left Right    Full Full       Extraocular Movement      Right Left    Full Full       Neuro/Psych    Oriented x3: Yes   Mood/Affect: Normal       Dilation    Both eyes: 1.0% Mydriacyl, 2.5% Phenylephrine @ 8:26 AM        Slit Lamp and Fundus Exam    Slit Lamp Exam      Right Left   Lids/Lashes Dermatochalasis - upper lid Dermatochalasis - upper lid, Meibomian gland dysfunction   Conjunctiva/Sclera  White and quiet White and quiet   Cornea 1-2+ Punctate epithelial erosions  arcus, 1+ diffuse Punctate epithelial erosions, Well healed cataract wounds   Anterior Chamber Deep and quiet Deep and quiet   Iris Round and dilated Round and well dilated   Lens 2+ Nuclear sclerosis, 2+ Cortical cataract PC IOL in excellent position, 1+ PCO   Vitreous Vitreous syneresis, Posterior vitreous detachment post vitrectomy; 0.5+pigment       Fundus Exam      Right Left   Disc Pink and Sharp trace pallor, sharp rim   C/D Ratio 0.5 0.5   Macula Flat, Blunted foveal reflex, RPE mottling and clumping, No heme or edema flat; mac hole closed, good foveal reflex, mild interval improvement in trace cystic changes centrally, trace Retinal pigment epithelial mottling, No heme   Vessels Vascular attenuation, AV crossing changes Vascular attenuation, AV crossing changes   Periphery Attached, No RT/RD Attached, good laser 360        Refraction    Wearing Rx      Sphere Cylinder Axis Add   Right +0.75 +1.25 028 +2.25   Left -0.25 +1.00 028 +2.25          IMAGING AND PROCEDURES  Imaging and Procedures for _0 @  OCT, Retina - OU - Both Eyes       Right Eye Quality was good. Central Foveal Thickness: 249. Progression has been stable. Findings include normal foveal contour, no SRF, no IRF, outer retinal atrophy (Focal ellipsoid disruption superior to fovea -- improving).   Left Eye Quality was good. Central Foveal Thickness: 348. Progression has improved. Findings include no SRF, normal foveal contour,  intraretinal fluid (Interval improvement in IRF/cystic changes).   Notes *Images captured and stored on drive  Diagnosis / Impression:  OD: NFP, No IRF/SRF; Focal ellipsoid disruption superior to fovea -- improving OS: Mac hole closed beautifully; Interval improvement in IRF/cystic changes   Clinical management:  See below  Abbreviations: NFP - Normal foveal profile. CME - cystoid macular edema.  PED - pigment epithelial detachment. IRF - intraretinal fluid. SRF - subretinal fluid. EZ - ellipsoid zone. ERM - epiretinal membrane. ORA - outer retinal atrophy. ORT - outer retinal tubulation. SRHM - subretinal hyper-reflective material                 ASSESSMENT/PLAN:    ICD-10-CM   1. Macular hole of left eye  H35.342   2. Retinal edema  H35.81 OCT, Retina - OU - Both Eyes  3. Cystoid macular edema of left eye  H35.352   4. Essential hypertension  I10   5. Hypertensive retinopathy of both eyes  H35.033   6. 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 Pseudophakia  Z96.1   8. Dry eyes  H04.123   9. Combined forms of age-related cataract of both eyes  H25.813     1,2,3. Full Thickness Macular Hole OS  - preop BCVA 20/100 w/ central scotoma OS  - s/p PPV/ICG/Membrane peel/C3F8, 01.02.20  - doing well with macular hole closed and ellipsoid signal improved back to baseline essentially  - BCVA: 20/30-- (improved post cataract surgery in June 2020)  - interval improvement in mild CME / cystic changes, but have not been able to fully wean her off of PF/Prolensa  - appears pt may require low dose maintenance treatment to reduce CME / cystic changes -- PF/Prolensa BID OS  - IOP okay at 11 OS  - F/U 4-6 months -- CME check -- DFE/OCT  4,5. Hypertensive retinopathy OU  - discussed importance of tight BP control  - monitor  6. 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  - discussed diagnosis and progression  - under the expert management of Dr. Shirleen Schirmer  7. Pseudophakia OS  - s/p CE/IOL OS (June 2020, C. Groat)  - beautiful surgery with IOL in excellent position  - tr CME/cystic changes as discussed above  - due to second recurrence of CME, recommend long term mainteancne use of Pred Forte and Prolensa -- BID OS  - monitor  8. Dry eyes OU  - recommend  artificial tears and lubricating ointment as needed (every 1-2 hours is fine)  - stopped xiidra -- now on Restasis   Ophthalmic Meds Ordered this visit:  Meds ordered this encounter  Medications  . prednisoLONE acetate (PRED FORTE) 1 % ophthalmic suspension    Sig: Place 1 drop into the left eye 2 (two) times daily.    Dispense:  15 mL    Refill:  5  . Bromfenac Sodium (PROLENSA) 0.07 % SOLN    Sig: Place 1 drop into the left eye 2 (two) times daily.    Dispense:  6 mL    Refill:  5       Return for f/u 4-6 months, FTMH OS, DFE, OCT.  There are no Patient Instructions on file for this visit.   This document serves as a record of services personally performed by Gardiner Sleeper, MD, PhD. It was created on their behalf by Mitzi Hansen  B[place], COT an ophthalmic technician. The creation of this record is the provider's dictation and/or activities during the visit.    Electronically signed by: Estill Bakes, COT 10/07/19 @ 11:44 PM   This document serves as a record of services personally performed by Gardiner Sleeper, MD, PhD. It was created on their behalf by Ernest Mallick, OA, an ophthalmic assistant. The creation of this record is the provider's dictation and/or activities during the visit.    Electronically signed by: Ernest Mallick, OA 04.20.2021 11:44 PM  Gardiner Sleeper, M.D., Ph.D. Diseases & Surgery of the Retina and [place] 10/08/2019   I have reviewed the above documentation for accuracy and completeness, and I agree with the above. Gardiner Sleeper, M.D., Ph.D. 10/08/19 11:44 PM   Abbreviations: M myopia (nearsighted); A astigmatism; H hyperopia (farsighted); P presbyopia; Mrx spectacle prescription;  CTL contact lenses; OD right eye; OS left eye; OU both eyes  XT exotropia; ET esotropia; PEK punctate epithelial keratitis; PEE punctate epithelial erosions; DES dry eye syndrome; MGD meibomian gland dysfunction; ATs artificial tears; PFAT's  preservative free artificial tears; [place] nuclear sclerotic cataract; PSC posterior subcapsular cataract; ERM epi-retinal membrane; PVD posterior vitreous detachment; RD retinal detachment; DM diabetes mellitus; DR diabetic retinopathy; NPDR non-proliferative diabetic retinopathy; PDR proliferative diabetic retinopathy; CSME clinically significant macular edema; DME diabetic macular edema; dbh dot blot hemorrhages; CWS cotton wool spot; POAG primary open angle glaucoma; C/D cup-to-disc ratio; HVF humphrey visual field; GVF goldmann visual field; OCT optical coherence tomography; IOP intraocular pressure; BRVO Branch retinal vein occlusion; CRVO central retinal vein occlusion; CRAO central retinal artery occlusion; BRAO branch retinal artery occlusion; RT retinal tear; SB scleral buckle; PPV pars plana vitrectomy; VH Vitreous hemorrhage; PRP panretinal laser photocoagulation; IVK intravitreal kenalog; VMT vitreomacular traction; MH Macular hole;  NVD neovascularization of the disc; NVE neovascularization elsewhere; AREDS age related eye disease study; ARMD age related macular degeneration; POAG primary open angle glaucoma; EBMD epithelial/anterior basement membrane dystrophy; ACIOL anterior chamber intraocular lens; IOL intraocular lens; PCIOL posterior chamber intraocular lens; Phaco/IOL phacoemulsification with intraocular lens placement; [place] photorefractive keratectomy; LASIK laser assisted in situ keratomileusis; HTN hypertension; DM diabetes mellitus; COPD chronic obstructive pulmonary disease

## 2019-10-08 ENCOUNTER — Encounter (INDEPENDENT_AMBULATORY_CARE_PROVIDER_SITE_OTHER): Payer: Self-pay | Admitting: Ophthalmology

## 2019-10-08 ENCOUNTER — Other Ambulatory Visit: Payer: Self-pay

## 2019-10-08 ENCOUNTER — Ambulatory Visit (INDEPENDENT_AMBULATORY_CARE_PROVIDER_SITE_OTHER): Payer: Medicare PPO | Admitting: Ophthalmology

## 2019-10-08 DIAGNOSIS — H25811 Combined forms of age-related cataract, right eye: Secondary | ICD-10-CM

## 2019-10-08 DIAGNOSIS — I10 Essential (primary) hypertension: Secondary | ICD-10-CM | POA: Diagnosis not present

## 2019-10-08 DIAGNOSIS — H3581 Retinal edema: Secondary | ICD-10-CM | POA: Diagnosis not present

## 2019-10-08 DIAGNOSIS — H25813 Combined forms of age-related cataract, bilateral: Secondary | ICD-10-CM

## 2019-10-08 DIAGNOSIS — H35342 Macular cyst, hole, or pseudohole, left eye: Secondary | ICD-10-CM | POA: Diagnosis not present

## 2019-10-08 DIAGNOSIS — H35352 Cystoid macular degeneration, left eye: Secondary | ICD-10-CM

## 2019-10-08 DIAGNOSIS — Z961 Presence of intraocular lens: Secondary | ICD-10-CM

## 2019-10-08 DIAGNOSIS — H04123 Dry eye syndrome of bilateral lacrimal glands: Secondary | ICD-10-CM

## 2019-10-08 DIAGNOSIS — H35033 Hypertensive retinopathy, bilateral: Secondary | ICD-10-CM

## 2019-10-08 MED ORDER — PROLENSA 0.07 % OP SOLN: 1.0000 [drp] | Freq: Two times a day (BID) | OPHTHALMIC | 5 refills | Status: AC

## 2019-10-08 MED ORDER — PREDNISOLONE ACETATE 1 % OP SUSP: 1.0000 [drp] | Freq: Two times a day (BID) | OPHTHALMIC | 5 refills | Status: AC

## 2019-10-14 ENCOUNTER — Other Ambulatory Visit (INDEPENDENT_AMBULATORY_CARE_PROVIDER_SITE_OTHER): Payer: Self-pay

## 2019-11-11 ENCOUNTER — Encounter (INDEPENDENT_AMBULATORY_CARE_PROVIDER_SITE_OTHER): Payer: Self-pay | Admitting: Ophthalmology

## 2019-11-12 ENCOUNTER — Encounter (INDEPENDENT_AMBULATORY_CARE_PROVIDER_SITE_OTHER): Payer: Self-pay | Admitting: Ophthalmology

## 2019-11-12 ENCOUNTER — Ambulatory Visit (INDEPENDENT_AMBULATORY_CARE_PROVIDER_SITE_OTHER): Payer: Medicare PPO | Admitting: Ophthalmology

## 2019-11-12 ENCOUNTER — Other Ambulatory Visit: Payer: Self-pay

## 2019-11-12 DIAGNOSIS — H35352 Cystoid macular degeneration, left eye: Secondary | ICD-10-CM

## 2019-11-12 DIAGNOSIS — I10 Essential (primary) hypertension: Secondary | ICD-10-CM | POA: Diagnosis not present

## 2019-11-12 DIAGNOSIS — H3581 Retinal edema: Secondary | ICD-10-CM | POA: Diagnosis not present

## 2019-11-12 DIAGNOSIS — H35342 Macular cyst, hole, or pseudohole, left eye: Secondary | ICD-10-CM | POA: Diagnosis not present

## 2019-11-12 DIAGNOSIS — H04123 Dry eye syndrome of bilateral lacrimal glands: Secondary | ICD-10-CM

## 2019-11-12 DIAGNOSIS — Z961 Presence of intraocular lens: Secondary | ICD-10-CM

## 2019-11-12 DIAGNOSIS — H35033 Hypertensive retinopathy, bilateral: Secondary | ICD-10-CM

## 2019-11-12 DIAGNOSIS — H25811 Combined forms of age-related cataract, right eye: Secondary | ICD-10-CM

## 2019-11-12 NOTE — Progress Notes (Addendum)
[facility] Note  11/12/2019     CHIEF COMPLAINT Patient presents for Blurred Vision   HISTORY OF PRESENT ILLNESS: Dominique Lee is a 69 y.o. female who presents to the clinic today for:   HPI    Blurred Vision    In left eye.  Onset was sudden.  Vision is blurred and distorted.  This started 3 days ago.  Occurring constantly.  Context:  distance vision, driving, computer work, near vision and reading.  I, the attending physician,  performed the HPI with the patient and updated documentation appropriately.          Comments    When driving Saturday she noticed vision decreased and the road looked like an hourglass shape.  Now, when on her phone or reading she is unable to make words out.  It is like the words are all together.  The symptom is constant but at times worse than others OS.  She sees FOLs but this is "normal" for her since she first started coming to our office.  Denies any new problems.  Restasis BID OU, Prolenza BID and Prednisolone BID OS.       Last edited by Bernarda Caffey, MD on 11/12/2019  8:48 AM. (History)    pt presents acutely today for distortion in her left eye (since Saturday), she states the eye also feels more dry than normal, pt is still using PF and Prolensa BID OS as well as Restasis BID OU   Referring physician: No referring provider defined for this encounter.  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 bacitracin-polymyxin b (POLYSPORIN) ophthalmic ointment Place into the left eye 4 (four) times daily. Place a 1/2 inch ribbon of ointment into the lower eyelid. (Patient not taking: Reported on 04/15/2019)  . bromfenac (XIBROM) 0.09 % ophthalmic solution INSTILL 1 DROP OF SOLUTION INTO LEFT EYE TWICE DAILY  . Bromfenac  Sodium (PROLENSA) 0.07 % SOLN Place 1 drop into the left eye 2 (two) times daily.  Marland Kitchen ketorolac (ACULAR) 0.5 % ophthalmic solution Place 1 drop into the left eye 4 (four) times daily. (Patient not taking: Reported on 04/15/2019)  . prednisoLONE acetate (PRED FORTE) 1 % ophthalmic suspension Place 1 drop into the left eye 2 (two) times daily.  Marland Kitchen Propylene Glycol (SYSTANE COMPLETE) 0.6 % SOLN Place 1 drop into both eyes 3 (three) times daily as needed (dry/irritated eyes.).  Marland Kitchen XIIDRA 5 % SOLN Apply 1 drop to eye 2 (two) times daily.   No current facility-administered medications for this visit. (Ophthalmic Drugs)   Current Outpatient Medications (Other)  Medication Sig  . acetaminophen (TYLENOL) 500 MG tablet Take 1,000 mg by mouth at bedtime.  . Cholecalciferol (VITAMIN D-1000 MAX ST) 25 MCG (1000 UT) tablet Take 1,000 Units by mouth daily with lunch.  . cyclobenzaprine (FEXMID) 7.5 MG tablet Take 1 tablet (7.5 mg total) by mouth 3 (three) times daily as needed for muscle spasms.  Marland Kitchen enoxaparin (LOVENOX) 40 MG/0.4ML injection Inject 0.4 mLs (40 mg total) into the skin daily.  . fluticasone (FLONASE) 50 MCG/ACT nasal spray Place 1 spray into both nostrils daily as needed for allergies or rhinitis.  . folic acid (FOLVITE) 1 MG tablet Take 1 mg by mouth daily with lunch.   . ibuprofen (ADVIL,MOTRIN) 200 MG tablet  Take 1 tablet (200 mg total) by mouth 3 (three) times daily.  Marland Kitchen leflunomide (ARAVA) 10 MG tablet Take 10 mg by mouth daily with lunch.   . levothyroxine (SYNTHROID, LEVOTHROID) 75 MCG tablet Take 75 mcg by mouth daily before breakfast.   . methotrexate 2.5 MG tablet Take 15 mg by mouth every Thursday. Thursdays with evening meal  . metoprolol succinate (TOPROL-XL) 50 MG 24 hr tablet Take 1-1.5 tablets (50-75 mg total) by mouth daily. (Patient taking differently: Take 50 mg by mouth at bedtime. )  . omeprazole (PRILOSEC) 20 MG capsule Take 1 capsule (20 mg total) by mouth daily for 14 days.  (Patient not taking: Reported on 06/08/2018)  . ondansetron (ZOFRAN) 4 MG tablet   . polyethylene glycol (MIRALAX / GLYCOLAX) packet Take 17 g by mouth daily as needed for mild constipation.   No current facility-administered medications for this visit. (Other)      REVIEW OF SYSTEMS: ROS    Positive for: Musculoskeletal, Endocrine, Cardiovascular, Eyes, Respiratory   Negative for: Constitutional, Gastrointestinal, Neurological, Skin, Genitourinary, HENT, Psychiatric, Allergic/Imm, Heme/Lymph   Last edited by Leonie Douglas, COA on 11/12/2019  8:10 AM. (History)       ALLERGIES Allergies  Allergen Reactions  . Codeine Other (See Comments)    Agitation   . Morphine And Related Other (See Comments)    agitation   . Statins Other (See Comments)    Joint  pain    PAST MEDICAL HISTORY Past Medical History:  Diagnosis Date  . Cancer (HCC)    Basal cell- skin  . Cataract    OD  . Complication of anesthesia   . COPD (chronic obstructive pulmonary disease) ([place])   . Dysrhythmia    [name]  . Family history of adverse reaction to anesthesia    Mother N/V  . Fibromyalgia   . High cholesterol   . Hypertensive retinopathy    OU  . Hypothyroidism   . Irregular heart beat   . Osteoarthritis   . Pneumothorax after biopsy   . PONV (postoperative nausea and vomiting)   . [name] (premature ventricular contraction)   . RA (rheumatoid arthritis) ([place])   . Thyroid disease    Hypothyroid   Past Surgical History:  Procedure Laterality Date  . [place] VITRECTOMY WITH 20 GAUGE MVR PORT FOR MACULAR HOLE Left 06/21/2018   Procedure: 25 GAUGE PARS PLANA VITRECTOMY WITH 20 GAUGE MVR PORT FOR MACULAR HOLE;  Surgeon: Bernarda Caffey, MD;  Location: [place];  Service: Ophthalmology;  Laterality: Left;  . ABDOMINAL HYSTERECTOMY  2000  . BREAST SURGERY     saline implants  . CARDIOVASCULAR STRESS TEST  09/02/2005   Cardiolite Myocardial perfusion study; NegativeBruce protocol exercise stress  test  . CATARACT EXTRACTION Left 11/2018   Dr. Midge Aver  . EYE SURGERY Left    macular hole repair  . GAS/FLUID EXCHANGE Left 06/21/2018   Procedure: GAS/FLUID EXCHANGE;  Surgeon: Bernarda Caffey, MD;  Location: [place];  Service: Ophthalmology;  Laterality: Left;  C3F8  . HIP PINNING,CANNULATED Right 12/18/2017   Procedure: CANNULATED HIP PINNING;  Surgeon: Hiram Gash, MD;  Location: [place];  Service: Orthopedics;  Laterality: Right;  . LAPAROSCOPIC PARTIAL COLECTOMY Left 05/18/2017  . Lung Biospy    . MEMBRANE PEEL Left 06/21/2018   Procedure: MEMBRANE PEEL;  Surgeon: Bernarda Caffey, MD;  Location: [place];  Service: Ophthalmology;  Laterality: Left;  . NASAL SINUS SURGERY    . PHOTOCOAGULATION WITH  LASER Left 06/21/2018   Procedure: PHOTOCOAGULATION WITH LASER;  Surgeon: Bernarda Caffey, MD;  Location: [place];  Service: Ophthalmology;  Laterality: Left;  . TUBAL LIGATION    . US ECHOCARDIOGRAPHY  12/26/2011   mild abnormalities    FAMILY HISTORY Family History  Problem Relation Age of Onset  . Hypertension Mother   . Cancer - Lung Father 62       deceased  . Heart attack Maternal Grandmother   . Cancer Maternal Grandfather   . Diabetes Paternal Grandfather   . Diabetes Brother   . Cancer - Colon Sister     SOCIAL HISTORY Social History   Tobacco Use  . Smoking status: Former Smoker    Types: Cigarettes    Quit date: 11/04/2002    Years since quitting: 17.0  . Smokeless tobacco: Never Used  Substance Use Topics  . Alcohol use: No  . Drug use: No         OPHTHALMIC EXAM:  Base Eye Exam    Visual Acuity (Snellen - Linear)      Right Left   Dist cc 20/20 -2 20/40 +1   Correction: Glasses       Tonometry (Tonopen, 8:22 AM)      Right Left   Pressure 15 17       Pupils      Dark Light Shape React APD   Right 3 2 Round Brisk None   Left 4 3 Round Slow None       Visual Fields (Counting fingers)      Left Right    Full Full       Extraocular Movement      Right  Left    Full Full       Neuro/Psych    Oriented x3: Yes   Mood/Affect: Normal       Dilation    Both eyes: 1.0% Mydriacyl, 2.5% Phenylephrine @ 8:22 AM        Slit Lamp and Fundus Exam    Slit Lamp Exam      Right Left   Lids/Lashes Dermatochalasis - upper lid Dermatochalasis - upper lid   Conjunctiva/Sclera White and quiet nasal and temporla Pinguecula   Cornea 1+ Punctate epithelial erosions, mild Debris in tear film arcus, 2-3+ diffuse Punctate epithelial erosions, Well healed cataract wounds   Anterior Chamber Deep and quiet Deep and quiet   Iris Round and dilated Round and well dilated   Lens 2+ Nuclear sclerosis, 2+ Cortical cataract PC IOL in good position with open PC   Vitreous Vitreous syneresis, Posterior vitreous detachment post vitrectomy; 0.5+pigment       Fundus Exam      Right Left   Disc Pink and Sharp trace pallor, sharp rim   C/D Ratio 0.5 0.5   Macula Flat, Blunted foveal reflex, RPE mottling and clumping, No heme or edema flat; mac hole closed, good foveal reflex, mild persistent trace cystic changes centrally, trace Retinal pigment epithelial mottling, No heme   Vessels Vascular attenuation, AV crossing changes Vascular attenuation, AV crossing changes   Periphery Attached, No RT/RD Attached, good laser 360        Refraction    Wearing Rx      Sphere Cylinder Axis Add   Right +0.75 +1.25 028 +2.25   Left -0.25 +1.00 028 +2.25       Manifest Refraction      Sphere Cylinder Axis Dist VA   Right  Left -0.75 +0.75 015 NI          IMAGING AND PROCEDURES  Imaging and Procedures for _0 @  OCT, Retina - OU - Both Eyes       Right Eye Quality was good. Central Foveal Thickness: 250. Progression has been stable. Findings include normal foveal contour, no SRF, no IRF, outer retinal atrophy (Focal ellipsoid disruption superior to fovea -- improving).   Left Eye Quality was good. Central Foveal Thickness: 349. Progression has been  stable. Findings include no SRF, normal foveal contour, intraretinal fluid (Mild persistent cystic changes).   Notes *Images captured and stored on drive  Diagnosis / Impression:  OD: NFP, No IRF/SRF; Focal ellipsoid disruption superior to fovea -- improving OS: Mac hole closed beautifully; mild persistent cystic changes -- stable from prior  Clinical management:  See below  Abbreviations: NFP - Normal foveal profile. CME - cystoid macular edema. PED - pigment epithelial detachment. IRF - intraretinal fluid. SRF - subretinal fluid. EZ - ellipsoid zone. ERM - epiretinal membrane. ORA - outer retinal atrophy. ORT - outer retinal tubulation. SRHM - subretinal hyper-reflective material                 ASSESSMENT/PLAN:    ICD-10-CM   1. Macular hole of left eye  H35.342   2. Retinal edema  H35.81 OCT, Retina - OU - Both Eyes  3. Cystoid macular edema of left eye  H35.352   4. Essential hypertension  I10   5. Hypertensive retinopathy of both eyes  H35.033   6. 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 Pseudophakia  Z96.1   8. Dry eyes  H04.123    **Pt presents as an add-on pt for new onset distortion OS, onset Saturday, 5.22.21 - only change on exam/imaging is worse PEE OS - OCT shows persistent cystic changes OS - suspect symptoms related to either one - recommend aggressive treatment of DES with inc in ATs to q2h OS - consider STK OS if symptoms fail to improve with aggressive lubrication and improvement in PEE/corneal surface - f/u 3-4 wks  1,2,3. Full Thickness Macular Hole OS  - preop BCVA 20/100 w/ central scotoma OS  - s/p PPV/ICG/Membrane peel/C3F8, 01.02.20  - doing well with macular hole closed and ellipsoid signal improved back to baseline essentially  - BCVA: 20/40 -- decreased today  - mild persistent cystic changes, and have not been able to fully wean her off of PF/Prolensa  - appears pt may require low dose maintenance treatment to  reduce CME / cystic changes -- PF/Prolensa BID OS  - add AT's q2h OS as above  - IOP okay at 17 OS  - F/U 3-4 weeks -- CME check -- DFE/OCT  4,5. Hypertensive retinopathy OU  - discussed importance of tight BP control  - monitor  6. 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  - discussed diagnosis and progression  - under the expert management of Dr. Shirleen Schirmer  7. Pseudophakia OS  - s/p CE/IOL OS (June 2020, C. Groat)  - beautiful surgery with IOL in excellent position  - tr CME/cystic changes as discussed above  - due to second recurrence of CME, recommend long term mainteancne use of Pred Forte and Prolensa -- BID OS  - monitor  8. Dry eyes OU  - recommend artificial tears and lubricating ointment as needed (every 1-2  hours is fine)  - stopped xiidra -- now on Restasis   Ophthalmic Meds Ordered this visit:  No orders of the defined types were placed in this encounter.      Return for f/u 3-4 weeks, CME OS, DFE, OCT.  There are no Patient Instructions on file for this visit.   This document serves as a record of services personally performed by Gardiner Sleeper, MD, PhD. It was created on their behalf by Ernest Mallick, OA, an ophthalmic assistant. The creation of this record is the provider's dictation and/or activities during the visit.    Electronically signed by: Ernest Mallick, OA 05.25.2021 11:35 PM  Gardiner Sleeper, M.D., Ph.D. Diseases & Surgery of the Retina and Vitreous [facility]  I have reviewed the above documentation for accuracy and completeness, and I agree with the above. Gardiner Sleeper, M.D., Ph.D. 11/12/19 11:35 PM   Abbreviations: M myopia (nearsighted); A astigmatism; H hyperopia (farsighted); P presbyopia; Mrx spectacle prescription;  CTL contact lenses; OD right eye; OS left eye; OU both eyes  XT exotropia; ET esotropia; PEK punctate  epithelial keratitis; PEE punctate epithelial erosions; DES dry eye syndrome; MGD meibomian gland dysfunction; ATs artificial tears; PFAT's preservative free artificial tears; [place] nuclear sclerotic cataract; PSC posterior subcapsular cataract; ERM epi-retinal membrane; PVD posterior vitreous detachment; RD retinal detachment; DM diabetes mellitus; DR diabetic retinopathy; NPDR non-proliferative diabetic retinopathy; PDR proliferative diabetic retinopathy; CSME clinically significant macular edema; DME diabetic macular edema; dbh dot blot hemorrhages; CWS cotton wool spot; POAG primary open angle glaucoma; C/D cup-to-disc ratio; HVF humphrey visual field; GVF goldmann visual field; OCT optical coherence tomography; IOP intraocular pressure; BRVO Branch retinal vein occlusion; CRVO central retinal vein occlusion; CRAO central retinal artery occlusion; BRAO branch retinal artery occlusion; RT retinal tear; SB scleral buckle; PPV pars plana vitrectomy; VH Vitreous hemorrhage; PRP panretinal laser photocoagulation; IVK intravitreal kenalog; VMT vitreomacular traction; MH Macular hole;  NVD neovascularization of the disc; NVE neovascularization elsewhere; AREDS age related eye disease study; ARMD age related macular degeneration; POAG primary open angle glaucoma; EBMD epithelial/anterior basement membrane dystrophy; ACIOL anterior chamber intraocular lens; IOL intraocular lens; PCIOL posterior chamber intraocular lens; Phaco/IOL phacoemulsification with intraocular lens placement; [place] photorefractive keratectomy; LASIK laser assisted in situ keratomileusis; HTN hypertension; DM diabetes mellitus; COPD chronic obstructive pulmonary disease

## 2019-12-04 NOTE — Progress Notes (Signed)
[facility] Note  12/06/2019     CHIEF COMPLAINT Patient presents for Retina Follow Up   HISTORY OF PRESENT ILLNESS: Dominique Lee is a 69 y.o. 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  Severity is moderate.  Duration of 3 weeks.  Since onset it is stable.  I, the attending physician,  performed the HPI with the patient and updated documentation appropriately.          Comments    Patient states vision the same OU. Using Pred Forte and prolensa bid OS.       Last edited by Bernarda Caffey, MD on 12/08/2019 10:58 AM. (History)    pt states vision is about the same as last time  Referring physician: No referring provider defined for this encounter.  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 Bromfenac Sodium (PROLENSA) 0.07 % SOLN Place 1 drop into the left eye 2 (two) times daily.  . prednisoLONE acetate (PRED FORTE) 1 % ophthalmic suspension Place 1 drop into the left eye 2 (two) times daily.  Marland Kitchen Propylene Glycol (SYSTANE COMPLETE) 0.6 % SOLN Place 1 drop into both eyes 3 (three) times daily as needed (dry/irritated eyes.).  Marland Kitchen RESTASIS 0.05 % ophthalmic emulsion Place 1 drop into both eyes in the morning and at bedtime.  . bacitracin-polymyxin b (POLYSPORIN) ophthalmic ointment Place into the left eye 4 (four) times daily. Place a 1/2 inch ribbon of ointment into the lower eyelid. (Patient not taking: Reported on 04/15/2019)  . bromfenac (XIBROM) 0.09 % ophthalmic solution INSTILL 1 DROP OF SOLUTION INTO LEFT EYE TWICE DAILY (Patient not taking: Reported on 12/06/2019)  . ketorolac (ACULAR) 0.5 % ophthalmic solution Place 1 drop into the left eye 4 (four) times daily. (Patient not taking:  Reported on 04/15/2019)  . XIIDRA 5 % SOLN Apply 1 drop to eye 2 (two) times daily. (Patient not taking: Reported on 12/06/2019)   No current facility-administered medications for this visit. (Ophthalmic Drugs)   Current Outpatient Medications (Other)  Medication Sig  . acetaminophen (TYLENOL) 500 MG tablet Take 1,000 mg by mouth at bedtime.  . Cholecalciferol (VITAMIN D-1000 MAX ST) 25 MCG (1000 UT) tablet Take 1,000 Units by mouth daily with lunch.  . cyclobenzaprine (FEXMID) 7.5 MG tablet Take 1 tablet (7.5 mg total) by mouth 3 (three) times daily as needed for muscle spasms.  . fluticasone (FLONASE) 50 MCG/ACT nasal spray Place 1 spray into both nostrils daily as needed for allergies or rhinitis.  . folic acid (FOLVITE) 1 MG tablet Take 1 mg by mouth daily with lunch.   . ibuprofen (ADVIL,MOTRIN) 200 MG tablet Take 1 tablet (200 mg total) by mouth 3 (three) times daily.  Marland Kitchen leflunomide (ARAVA) 10 MG tablet Take 10 mg by mouth daily with lunch.   . levothyroxine (SYNTHROID, LEVOTHROID) 75 MCG tablet Take 75 mcg by mouth daily before breakfast.   . methotrexate 2.5 MG tablet Take 15 mg by mouth every Thursday. Thursdays with evening meal  . metoprolol succinate (TOPROL-XL) 50 MG 24 hr tablet Take 1-1.5 tablets (50-75 mg total) by mouth daily. (Patient taking differently: Take 50 mg by mouth at bedtime. )  . ondansetron (ZOFRAN)  4 MG tablet   . polyethylene glycol (MIRALAX / GLYCOLAX) packet Take 17 g by mouth daily as needed for mild constipation.  . enoxaparin (LOVENOX) 40 MG/0.4ML injection Inject 0.4 mLs (40 mg total) into the skin daily.  Marland Kitchen omeprazole (PRILOSEC) 20 MG capsule Take 1 capsule (20 mg total) by mouth daily for 14 days. (Patient not taking: Reported on 06/08/2018)   No current facility-administered medications for this visit. (Other)      REVIEW OF SYSTEMS: ROS    Positive for: Musculoskeletal, Endocrine, Cardiovascular, Eyes, Respiratory   Negative for: Constitutional,  Gastrointestinal, Neurological, Skin, Genitourinary, HENT, Psychiatric, Allergic/Imm, Heme/Lymph   Last edited by Roselee Nova D, COT on 12/06/2019  7:54 AM. (History)       ALLERGIES Allergies  Allergen Reactions  . Codeine Other (See Comments)    Agitation   . Morphine And Related Other (See Comments)    agitation   . Statins Other (See Comments)    Joint  pain    PAST MEDICAL HISTORY Past Medical History:  Diagnosis Date  . Cancer (HCC)    Basal cell- skin  . Cataract    OD  . Complication of anesthesia   . COPD (chronic obstructive pulmonary disease) ([place])   . Dysrhythmia    [name]  . Family history of adverse reaction to anesthesia    Mother N/V  . Fibromyalgia   . High cholesterol   . Hypertensive retinopathy    OU  . Hypothyroidism   . Irregular heart beat   . Osteoarthritis   . Pneumothorax after biopsy   . PONV (postoperative nausea and vomiting)   . [name] (premature ventricular contraction)   . RA (rheumatoid arthritis) ([place])   . Thyroid disease    Hypothyroid   Past Surgical History:  Procedure Laterality Date  . [place] VITRECTOMY WITH 20 GAUGE MVR PORT FOR MACULAR HOLE Left 06/21/2018   Procedure: 25 GAUGE PARS PLANA VITRECTOMY WITH 20 GAUGE MVR PORT FOR MACULAR HOLE;  Surgeon: Bernarda Caffey, MD;  Location: [facility];  Service: Ophthalmology;  Laterality: Left;  . ABDOMINAL HYSTERECTOMY  2000  . BREAST SURGERY     saline implants  . CARDIOVASCULAR STRESS TEST  09/02/2005   Cardiolite Myocardial perfusion study; NegativeBruce protocol exercise stress test  . CATARACT EXTRACTION Left 11/2018   Dr. Midge Aver  . EYE SURGERY Left    macular hole repair  . GAS/FLUID EXCHANGE Left 06/21/2018   Procedure: GAS/FLUID EXCHANGE;  Surgeon: Bernarda Caffey, MD;  Location: [facility];  Service: Ophthalmology;  Laterality: Left;  C3F8  . HIP PINNING,CANNULATED Right 12/18/2017   Procedure: CANNULATED HIP PINNING;  Surgeon: Hiram Gash, MD;  Location: [facility];  Service:  Orthopedics;  Laterality: Right;  . LAPAROSCOPIC PARTIAL COLECTOMY Left 05/18/2017  . Lung Biospy    . MEMBRANE PEEL Left 06/21/2018   Procedure: MEMBRANE PEEL;  Surgeon: Bernarda Caffey, MD;  Location: [facility];  Service: Ophthalmology;  Laterality: Left;  . NASAL SINUS SURGERY    . PHOTOCOAGULATION WITH LASER Left 06/21/2018   Procedure: PHOTOCOAGULATION WITH LASER;  Surgeon: Bernarda Caffey, MD;  Location: [facility];  Service: Ophthalmology;  Laterality: Left;  . TUBAL LIGATION    . US ECHOCARDIOGRAPHY  12/26/2011   mild abnormalities    FAMILY HISTORY Family History  Problem Relation Age of Onset  . Hypertension Mother   . Cancer - Lung Father 36       deceased  . Heart attack Maternal Grandmother   .  Cancer Maternal Grandfather   . Diabetes Paternal Grandfather   . Diabetes Brother   . Cancer - Colon Sister     SOCIAL HISTORY Social History   Tobacco Use  . Smoking status: Former Smoker    Types: Cigarettes    Quit date: 11/04/2002    Years since quitting: 17.1  . Smokeless tobacco: Never Used  Vaping Use  . Vaping Use: Never used  Substance Use Topics  . Alcohol use: No  . Drug use: No         OPHTHALMIC EXAM:  Base Eye Exam    Visual Acuity (Snellen - Linear)      Right Left   Dist cc 20/25 +2 20/40 +2   Dist ph cc NI NI   Correction: Glasses       Tonometry (Tonopen, 7:59 AM)      Right Left   Pressure 11 13       Pupils      Dark Light Shape React APD   Right 3 2 Round Brisk None   Left 4 3 Round Slow None       Visual Fields (Counting fingers)      Left Right    Full Full       Extraocular Movement      Right Left    Full, Ortho Full, Ortho       Neuro/Psych    Oriented x3: Yes   Mood/Affect: Normal       Dilation    Both eyes: 1.0% Mydriacyl, 2.5% Phenylephrine @ 7:59 AM        Slit Lamp and Fundus Exam    Slit Lamp Exam      Right Left   Lids/Lashes Dermatochalasis - upper lid Dermatochalasis - upper lid   Conjunctiva/Sclera White  and quiet nasal and temporla Pinguecula   Cornea 1+ Punctate epithelial erosions, mild Debris in tear film arcus, 1-2+ diffuse Punctate epithelial erosions, Well healed cataract wounds   Anterior Chamber Deep and quiet Deep and quiet   Iris Round and dilated Round and well dilated   Lens 2+ Nuclear sclerosis, 2+ Cortical cataract PC IOL in good position with open PC   Vitreous Vitreous syneresis, Posterior vitreous detachment post vitrectomy; 0.5+pigment       Fundus Exam      Right Left   Disc Pink and Sharp trace pallor, sharp rim   C/D Ratio 0.5 0.5   Macula Flat, Blunted foveal reflex, RPE mottling and clumping, No heme or edema flat; mac hole closed, good foveal reflex, mild persistent trace cystic changes centrally, trace Retinal pigment epithelial mottling, No heme   Vessels Vascular attenuation, AV crossing changes Vascular attenuation, AV crossing changes   Periphery Attached, No RT/RD Attached, good laser 360        Refraction    Wearing Rx      Sphere Cylinder Axis Add   Right +0.75 +1.25 028 +2.25   Left -0.25 +1.00 028 +2.25       Manifest Refraction      Sphere Cylinder Axis   Right +1.00 +1.25 018   Left +0.25 +0.25 126          IMAGING AND PROCEDURES  Imaging and Procedures for _0 @  OCT, Retina - OU - Both Eyes       Right Eye Quality was good. Central Foveal Thickness: 253. Progression has been stable. Findings include normal foveal contour, no SRF, no IRF, outer retinal atrophy (Focal ellipsoid disruption superior  to fovea --persistent/stable).   Left Eye Quality was good. Central Foveal Thickness: 347. Progression has been stable. Findings include no SRF, normal foveal contour, intraretinal fluid (Mild improvement in cystic changes).   Notes *Images captured and stored on drive  Diagnosis / Impression:  OD: NFP, No IRF/SRF; Focal ellipsoid disruption superior to fovea -- persistent/stable OS: Mac hole closed; Mild improvement in tr cystic  changes  Clinical management:  See below  Abbreviations: NFP - Normal foveal profile. CME - cystoid macular edema. PED - pigment epithelial detachment. IRF - intraretinal fluid. SRF - subretinal fluid. EZ - ellipsoid zone. ERM - epiretinal membrane. ORA - outer retinal atrophy. ORT - outer retinal tubulation. SRHM - subretinal hyper-reflective material                 ASSESSMENT/PLAN:    ICD-10-CM   1. Macular hole of left eye  H35.342   2. Retinal edema  H35.81 OCT, Retina - OU - Both Eyes  3. Cystoid macular edema of left eye  H35.352   4. Essential hypertension  I10   5. Hypertensive retinopathy of both eyes  H35.033   6. 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 Pseudophakia  Z96.1   8. Dry eyes  H04.123    1-3. Full Thickness Macular Hole OS  - preop BCVA 20/100 w/ central scotoma OS  - s/p PPV/ICG/Membrane peel/C3F8, 01.02.20  - doing well with macular hole closed and ellipsoid signal improved back to baseline essentially  - BCVA: 20/40 -- stable  - mild persistent cystic changes, and have not been able to fully wean her off of PF/Prolensa  - OCT shows mild interval improvement in tr cystic changes  - appears pt may require low dose maintenance treatment to reduce CME / cystic changes -- PF/Prolensa BID OS  - do not recommend STK at this time  - cont AT's q2h OS as above  - IOP okay at 13 OS  - F/U 3-4 months -- CME check -- DFE/OCT  4,5. Hypertensive retinopathy OU  - discussed importance of tight BP control  - monitor  6. 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  - discussed diagnosis and progression  - under the expert management of Dr. Shirleen Schirmer  7. Pseudophakia OS  - s/p CE/IOL OS (June 2020, [facility]. [facility])  - beautiful surgery with IOL in excellent position  - tr CME/cystic changes as discussed above  - due to second recurrence of CME,  recommend long term mainteancne use of Pred Forte and Prolensa -- BID OS  - monitor  8. Dry eyes OU  - recommend artificial tears and lubricating ointment as needed (every 1-2 hours is fine)  - stopped xiidra -- now on Restasis   Ophthalmic Meds Ordered this visit:  No orders of the defined types were placed in this encounter.      Return for f/u 3-4 months, FTMH / CME OS, DFE, OCT.  There are no Patient Instructions on file for this visit.   This document serves as a record of services personally performed by Gardiner Sleeper, MD, PhD. It was created on their behalf by Ernest Mallick, OA, an ophthalmic assistant. The creation of this record is the provider's dictation and/or activities during the visit.    Electronically signed by: Ernest Mallick, OA 06.16.2021 11:02 AM  Gardiner Sleeper, M.D., Ph.D. Diseases &  Surgery of the Retina and Vitreous [facility]  I have reviewed the above documentation for accuracy and completeness, and I agree with the above. Gardiner Sleeper, M.D., Ph.D. 12/08/19 11:02 AM    Abbreviations: M myopia (nearsighted); A astigmatism; H hyperopia (farsighted); P presbyopia; Mrx spectacle prescription;  CTL contact lenses; OD right eye; OS left eye; OU both eyes  XT exotropia; ET esotropia; PEK punctate epithelial keratitis; PEE punctate epithelial erosions; DES dry eye syndrome; MGD meibomian gland dysfunction; ATs artificial tears; PFAT's preservative free artificial tears; [place] nuclear sclerotic cataract; PSC posterior subcapsular cataract; ERM epi-retinal membrane; PVD posterior vitreous detachment; RD retinal detachment; DM diabetes mellitus; DR diabetic retinopathy; NPDR non-proliferative diabetic retinopathy; PDR proliferative diabetic retinopathy; CSME clinically significant macular edema; DME diabetic macular edema; dbh dot blot hemorrhages; CWS cotton wool spot; POAG primary open angle glaucoma; C/D cup-to-disc ratio; HVF humphrey  visual field; GVF goldmann visual field; OCT optical coherence tomography; IOP intraocular pressure; BRVO Branch retinal vein occlusion; CRVO central retinal vein occlusion; CRAO central retinal artery occlusion; BRAO branch retinal artery occlusion; RT retinal tear; SB scleral buckle; PPV pars plana vitrectomy; VH Vitreous hemorrhage; PRP panretinal laser photocoagulation; IVK intravitreal kenalog; VMT vitreomacular traction; MH Macular hole;  NVD neovascularization of the disc; NVE neovascularization elsewhere; AREDS age related eye disease study; ARMD age related macular degeneration; POAG primary open angle glaucoma; EBMD epithelial/anterior basement membrane dystrophy; ACIOL anterior chamber intraocular lens; IOL intraocular lens; PCIOL posterior chamber intraocular lens; Phaco/IOL phacoemulsification with intraocular lens placement; [place] photorefractive keratectomy; LASIK laser assisted in situ keratomileusis; HTN hypertension; DM diabetes mellitus; COPD chronic obstructive pulmonary disease

## 2019-12-06 ENCOUNTER — Other Ambulatory Visit: Payer: Self-pay

## 2019-12-06 ENCOUNTER — Ambulatory Visit (INDEPENDENT_AMBULATORY_CARE_PROVIDER_SITE_OTHER): Payer: Medicare PPO | Admitting: Ophthalmology

## 2019-12-06 ENCOUNTER — Encounter (INDEPENDENT_AMBULATORY_CARE_PROVIDER_SITE_OTHER): Payer: Self-pay | Admitting: Ophthalmology

## 2019-12-06 DIAGNOSIS — Z961 Presence of intraocular lens: Secondary | ICD-10-CM

## 2019-12-06 DIAGNOSIS — H35342 Macular cyst, hole, or pseudohole, left eye: Secondary | ICD-10-CM

## 2019-12-06 DIAGNOSIS — H25811 Combined forms of age-related cataract, right eye: Secondary | ICD-10-CM

## 2019-12-06 DIAGNOSIS — H35352 Cystoid macular degeneration, left eye: Secondary | ICD-10-CM

## 2019-12-06 DIAGNOSIS — H04123 Dry eye syndrome of bilateral lacrimal glands: Secondary | ICD-10-CM

## 2019-12-06 DIAGNOSIS — I10 Essential (primary) hypertension: Secondary | ICD-10-CM | POA: Diagnosis not present

## 2019-12-06 DIAGNOSIS — H35033 Hypertensive retinopathy, bilateral: Secondary | ICD-10-CM

## 2019-12-06 DIAGNOSIS — H3581 Retinal edema: Secondary | ICD-10-CM

## 2019-12-08 ENCOUNTER — Encounter (INDEPENDENT_AMBULATORY_CARE_PROVIDER_SITE_OTHER): Payer: Self-pay | Admitting: Ophthalmology

## 2019-12-19 IMAGING — CR DG HIP (WITH OR WITHOUT PELVIS) 1V PORT*R*
3 series · 3 of 3 positions shown · non-contrast
Comparison: 12/18/2017

CLINICAL DATA: Right hip pinning for femoral neck fracture

EXAM:
DG HIP (WITH OR WITHOUT PELVIS) 1V PORT RIGHT

[AP (1 of 2)]
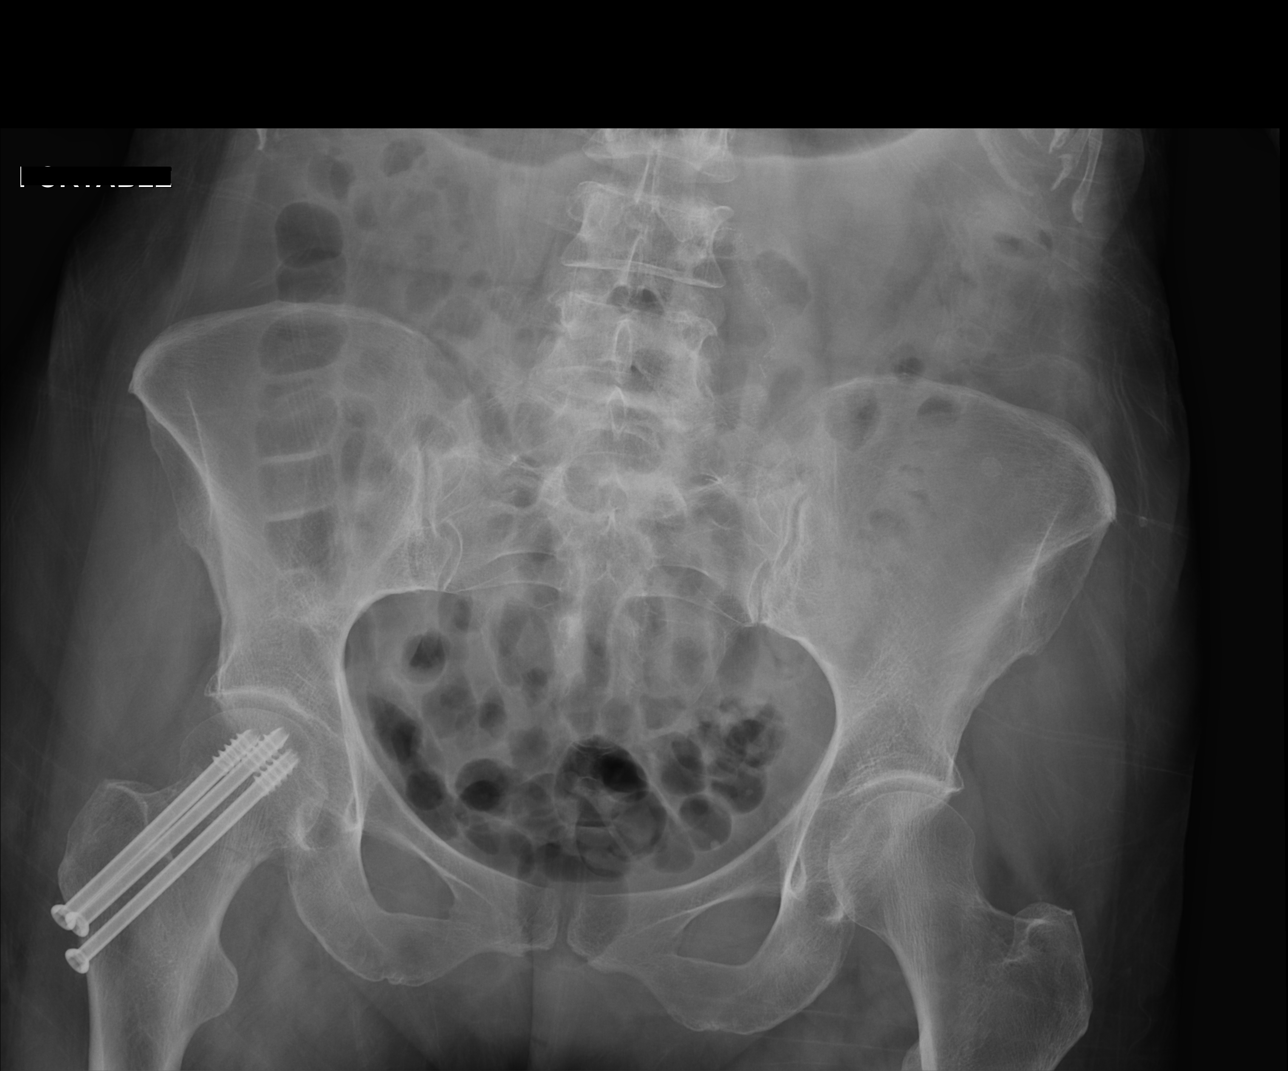

[AP (2 of 2)]
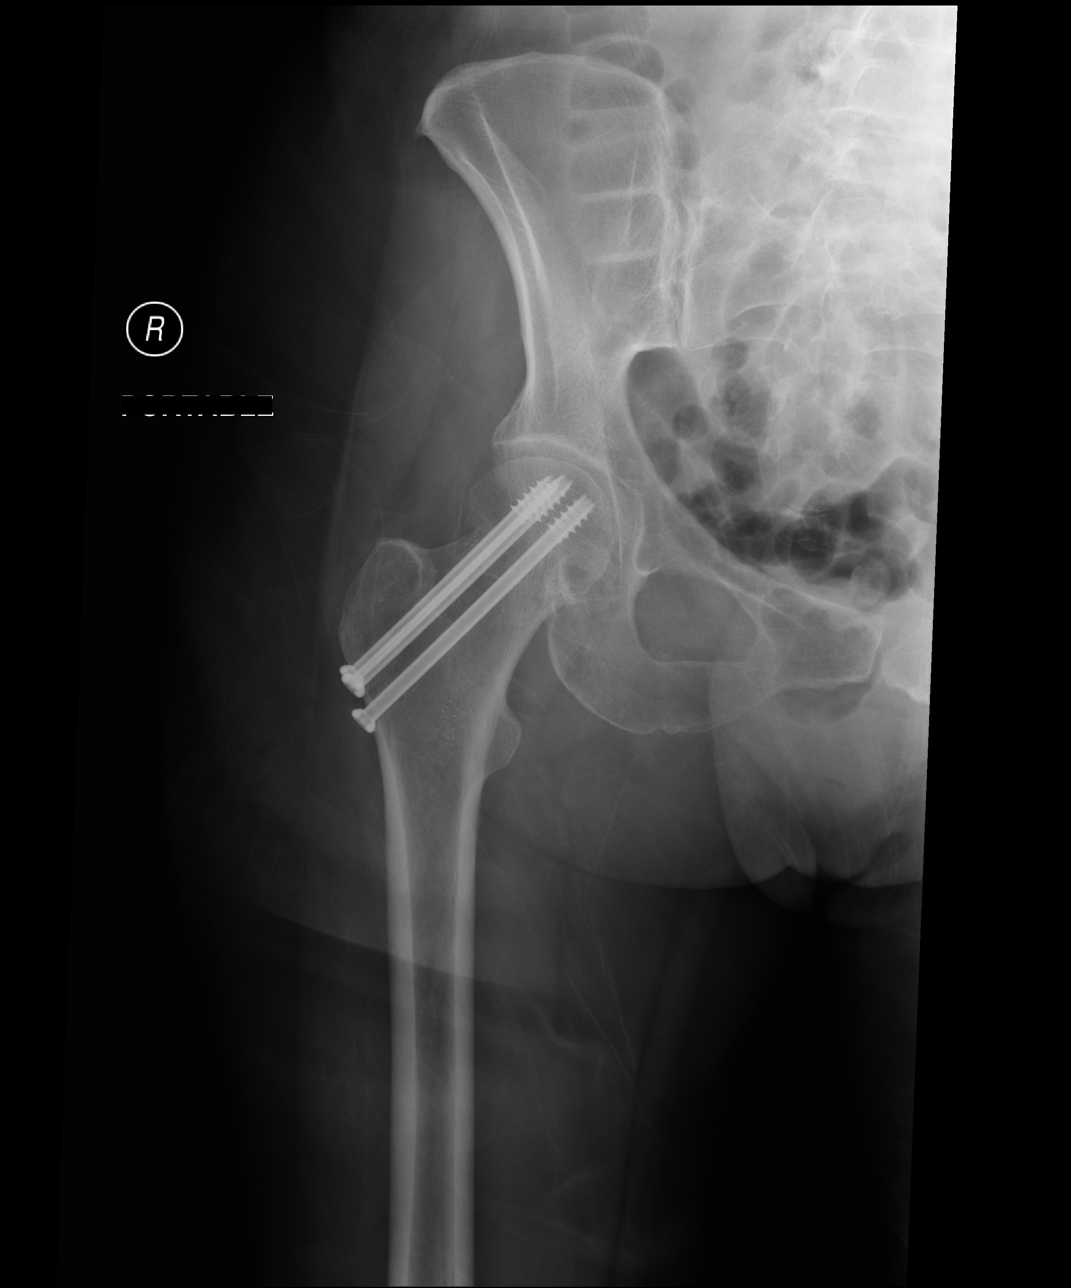

[xtable lateral]
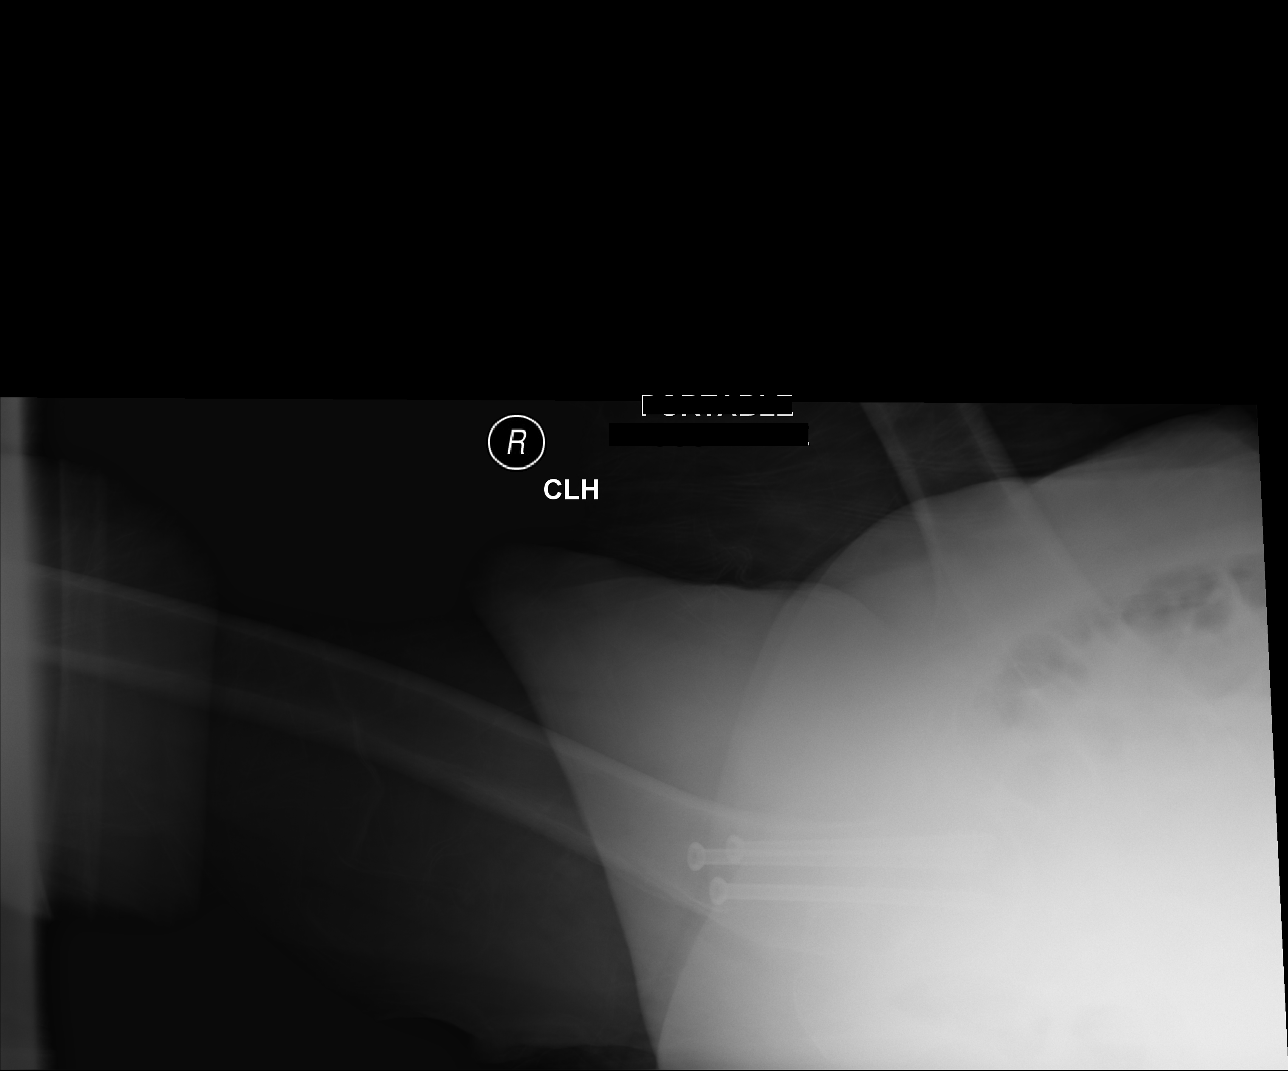

[3 of 3 positions shown; findings below may reference images not displayed]

FINDINGS: Inverted triangle grouping of 3 cannulated hip lag screws observed,
successfully lagging the femoral neck fracture, and without observed
distal cortical capture. Near anatomic alignment. No new fracture or
complicating feature observed.
IMPRESSION: 1. Successful cannulated lag screw placement across the femoral neck
fracture, without complicating feature.

## 2020-02-13 ENCOUNTER — Encounter (INDEPENDENT_AMBULATORY_CARE_PROVIDER_SITE_OTHER): Payer: Medicare PPO | Admitting: Ophthalmology

## 2020-03-05 NOTE — Progress Notes (Signed)
[facility] Note  03/09/2020     CHIEF COMPLAINT Patient presents for Retina Follow Up   HISTORY OF PRESENT ILLNESS: Dominique Lee is a 69 y.o. female who presents to the clinic today for:   HPI    Retina Follow Up    Patient presents with  Other.  In left eye.  This started 3 months ago.  Severity is moderate.  I, the attending physician,  performed the HPI with the patient and updated documentation appropriately.          Comments    Patient here for 3 months follow up for FTMH/CME OS. Patient states vision about the same. May be a little bit better. No eye pain. Had covid august 16th. Eye bothered at that time had felt like something was in it.        Last edited by Bernarda Caffey, MD on 03/09/2020  8:28 AM. 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 bacitracin-polymyxin b (POLYSPORIN) ophthalmic ointment Place into the left eye 4 (four) times daily. Place a 1/2 inch ribbon of ointment into the lower eyelid. (Patient not taking: Reported on 04/15/2019)  . bromfenac (XIBROM) 0.09 % ophthalmic solution INSTILL 1 DROP OF SOLUTION INTO LEFT EYE TWICE DAILY (Patient not taking: Reported on 12/06/2019)  . Bromfenac Sodium (PROLENSA) 0.07 % SOLN Place 1 drop into the left eye 2 (two) times daily.  . prednisoLONE acetate (PRED FORTE) 1 % ophthalmic suspension Place 1 drop into the left eye 2 (two) times daily.  Marland Kitchen Propylene Glycol  (SYSTANE COMPLETE) 0.6 % SOLN Place 1 drop into both eyes 3 (three) times daily as needed (dry/irritated eyes.).  Marland Kitchen RESTASIS 0.05 % ophthalmic emulsion Place 1 drop into both eyes in the morning and at bedtime.  Marland Kitchen XIIDRA 5 % SOLN Apply 1 drop to eye 2 (two) times daily. (Patient not taking: Reported on 12/06/2019)   No current facility-administered medications for this visit. (Ophthalmic Drugs)   Current Outpatient Medications (Other)  Medication Sig  . acetaminophen (TYLENOL) 500 MG tablet Take 1,000 mg by mouth at bedtime.  . Cholecalciferol (VITAMIN D-1000 MAX ST) 25 MCG (1000 UT) tablet Take 1,000 Units by mouth daily with lunch.  . cyclobenzaprine (FEXMID) 7.5 MG tablet Take 1 tablet (7.5 mg total) by mouth 3 (three) times daily as needed for muscle spasms.  Marland Kitchen enoxaparin (LOVENOX) 40 MG/0.4ML injection Inject 0.4 mLs (40 mg total) into the skin daily.  . fluticasone (FLONASE) 50 MCG/ACT nasal spray Place 1 spray into both nostrils daily as needed for allergies or rhinitis.  . folic acid (FOLVITE) 1 MG tablet Take 1 mg by mouth daily with lunch.   . ibuprofen (ADVIL,MOTRIN) 200 MG tablet Take 1 tablet (200 mg total) by mouth 3 (three) times daily.  Marland Kitchen leflunomide (ARAVA) 10 MG tablet Take 10 mg by mouth daily with lunch.   . levothyroxine (SYNTHROID, LEVOTHROID) 75 MCG tablet Take  75 mcg by mouth daily before breakfast.   . methotrexate 2.5 MG tablet Take 15 mg by mouth every Thursday. Thursdays with evening meal  . metoprolol succinate (TOPROL-XL) 50 MG 24 hr tablet Take 1-1.5 tablets (50-75 mg total) by mouth daily. (Patient taking differently: Take 50 mg by mouth at bedtime. )  . omeprazole (PRILOSEC) 20 MG capsule Take 1 capsule (20 mg total) by mouth daily for 14 days. (Patient not taking: Reported on 06/08/2018)  . ondansetron (ZOFRAN) 4 MG tablet   . polyethylene glycol (MIRALAX / GLYCOLAX) packet Take 17 g by mouth daily as needed for mild constipation.   No current  facility-administered medications for this visit. (Other)      REVIEW OF SYSTEMS: ROS    Positive for: Musculoskeletal, Endocrine, Cardiovascular, Eyes, Respiratory   Negative for: Constitutional, Gastrointestinal, Neurological, Skin, Genitourinary, HENT, Psychiatric, Allergic/Imm, Heme/Lymph   Last edited by Theodore Demark, COA on 03/09/2020  8:11 AM. (History)       ALLERGIES Allergies  Allergen Reactions  . Codeine Other (See Comments)    Agitation   . Morphine And Related Other (See Comments)    agitation   . Statins Other (See Comments)    Joint  pain    PAST MEDICAL HISTORY Past Medical History:  Diagnosis Date  . Cancer (HCC)    Basal cell- skin  . Cataract    OD  . Complication of anesthesia   . COPD (chronic obstructive pulmonary disease) ([place])   . Dysrhythmia    [name]  . Family history of adverse reaction to anesthesia    Mother N/V  . Fibromyalgia   . High cholesterol   . Hypertensive retinopathy    OU  . Hypothyroidism   . Irregular heart beat   . Osteoarthritis   . Pneumothorax after biopsy   . PONV (postoperative nausea and vomiting)   . [name] (premature ventricular contraction)   . RA (rheumatoid arthritis) ([place])   . Thyroid disease    Hypothyroid   Past Surgical History:  Procedure Laterality Date  . [place] VITRECTOMY WITH 20 GAUGE MVR PORT FOR MACULAR HOLE Left 06/21/2018   Procedure: 25 GAUGE PARS PLANA VITRECTOMY WITH 20 GAUGE MVR PORT FOR MACULAR HOLE;  Surgeon: Bernarda Caffey, MD;  Location: [facility];  Service: Ophthalmology;  Laterality: Left;  . ABDOMINAL HYSTERECTOMY  2000  . BREAST SURGERY     saline implants  . CARDIOVASCULAR STRESS TEST  09/02/2005   Cardiolite Myocardial perfusion study; NegativeBruce protocol exercise stress test  . CATARACT EXTRACTION Left 11/2018   Dr. Midge Aver  . EYE SURGERY Left    macular hole repair  . GAS/FLUID EXCHANGE Left 06/21/2018   Procedure: GAS/FLUID EXCHANGE;  Surgeon: Bernarda Caffey,  MD;  Location: [facility];  Service: Ophthalmology;  Laterality: Left;  C3F8  . HIP PINNING,CANNULATED Right 12/18/2017   Procedure: CANNULATED HIP PINNING;  Surgeon: Hiram Gash, MD;  Location: [place];  Service: Orthopedics;  Laterality: Right;  . LAPAROSCOPIC PARTIAL COLECTOMY Left 05/18/2017  . Lung Biospy    . MEMBRANE PEEL Left 06/21/2018   Procedure: MEMBRANE PEEL;  Surgeon: Bernarda Caffey, MD;  Location: [facility];  Service: Ophthalmology;  Laterality: Left;  . NASAL SINUS SURGERY    . PHOTOCOAGULATION WITH LASER Left 06/21/2018   Procedure: PHOTOCOAGULATION WITH LASER;  Surgeon: Bernarda Caffey, MD;  Location: [facility];  Service: Ophthalmology;  Laterality: Left;  . TUBAL LIGATION    . US ECHOCARDIOGRAPHY  12/26/2011  mild abnormalities    FAMILY HISTORY Family History  Problem Relation Age of Onset  . Hypertension Mother   . Cancer - Lung Father 47       deceased  . Heart attack Maternal Grandmother   . Cancer Maternal Grandfather   . Diabetes Paternal Grandfather   . Diabetes Brother   . Cancer - Colon Sister     SOCIAL HISTORY Social History   Tobacco Use  . Smoking status: Former Smoker    Types: Cigarettes    Quit date: 11/04/2002    Years since quitting: 17.3  . Smokeless tobacco: Never Used  Vaping Use  . Vaping Use: Never used  Substance Use Topics  . Alcohol use: No  . Drug use: No         OPHTHALMIC EXAM:  Base Eye Exam    Visual Acuity (Snellen - Linear)      Right Left   Dist cc 20/20 -1 20/40 +1   Dist ph cc  20/30 -2   Correction: Glasses       Tonometry (Tonopen, 8:06 AM)      Right Left   Pressure 13 16       Pupils      Dark Light Shape React APD   Right 3 2 Round Brisk None   Left 4 3 Round Brisk None       Visual Fields (Counting fingers)      Left Right    Full Full       Extraocular Movement      Right Left    Full, Ortho Full, Ortho       Neuro/Psych    Oriented x3: Yes   Mood/Affect: Normal       Dilation    Both eyes: 1.0%  Mydriacyl, 2.5% Phenylephrine @ 8:06 AM        Slit Lamp and Fundus Exam    Slit Lamp Exam      Right Left   Lids/Lashes Dermatochalasis - upper lid Dermatochalasis - upper lid   Conjunctiva/Sclera White and quiet nasal and temporla Pinguecula   Cornea Trace Punctate epithelial erosions, trace Debris in tear film arcus, 2-3+ diffuse Punctate epithelial erosions, Well healed cataract wounds   Anterior Chamber Deep and quiet Deep and quiet   Iris Round and dilated Round and well dilated   Lens 2+ Nuclear sclerosis, 2+ Cortical cataract PC IOL in good position with open PC   Vitreous Vitreous syneresis, Posterior vitreous detachment post vitrectomy; 0.5+pigment       Fundus Exam      Right Left   Disc Pink and Sharp trace pallor, sharp rim   C/D Ratio 0.5 0.5   Macula Flat, Blunted foveal reflex, RPE mottling and clumping, No heme or edema flat; mac hole closed, good foveal reflex, mild persistent trace cystic changes centrally -- slightly improved, trace Retinal pigment epithelial mottling, No heme   Vessels Vascular attenuation, AV crossing changes Vascular attenuation, AV crossing changes   Periphery Attached, No RT/RD Attached, good laser 360        Refraction    Wearing Rx      Sphere Cylinder Axis Add   Right +0.75 +1.25 028 +2.25   Left -0.25 +1.00 028 +2.25          IMAGING AND PROCEDURES  Imaging and Procedures for _0 @  OCT, Retina - OU - Both Eyes       Right Eye Quality was good. Central Foveal Thickness: 246. Progression  has been stable. Findings include normal foveal contour, no SRF, no IRF, outer retinal atrophy (Focal ellipsoid disruption superior to fovea --persistent/stable).   Left Eye Quality was good. Central Foveal Thickness: 338. Progression has improved. Findings include no SRF, intraretinal fluid, abnormal foveal contour (Mild improvement in cystic changes).   Notes *Images captured and stored on drive  Diagnosis / Impression:  OD: NFP, No  IRF/SRF; Focal ellipsoid disruption superior to fovea -- persistent/stable OS: Mac hole closed; irregular inner retinal surface--stable; Mild improvement in tr cystic changes  Clinical management:  See below  Abbreviations: NFP - Normal foveal profile. CME - cystoid macular edema. PED - pigment epithelial detachment. IRF - intraretinal fluid. SRF - subretinal fluid. EZ - ellipsoid zone. ERM - epiretinal membrane. ORA - outer retinal atrophy. ORT - outer retinal tubulation. SRHM - subretinal hyper-reflective material                 ASSESSMENT/PLAN:    ICD-10-CM   1. Macular hole of left eye  H35.342   2. Retinal edema  H35.81 OCT, Retina - OU - Both Eyes  3. Cystoid macular edema of left eye  H35.352   4. Essential hypertension  I10   5. Hypertensive retinopathy of both eyes  H35.033   6. 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 Pseudophakia  Z96.1   8. Dry eyes  H04.123   9. Combined forms of age-related cataract of both eyes  H25.813    1-3. Full Thickness Macular Hole OS  - preop BCVA 20/100 w/ central scotoma OS  - s/p PPV/ICG/Membrane peel/C3F8, 01.02.20  - doing well with macular hole closed and ellipsoid signal improved back to baseline essentially  - BCVA: 20/30 -- improved  - mild persistent cystic changes, and have not been able to fully wean her off of PF/Prolensa  - OCT shows mild interval improvement in tr cystic changes  - appears pt may require low dose maintenance treatment to reduce CME / cystic changes -- PF/Prolensa BID OS -- pt wishes to trial being off drops -- decrease both to 1 drop/day for 3 weeks, then stop  - IOP okay at 16 OS  - F/U 6-8 weeks -- CME check -- DFE/OCT  4,5. Hypertensive retinopathy OU  - discussed importance of tight BP control  - monitor  6. 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  - discussed diagnosis and  progression  - under the expert management of Dr. Shirleen Schirmer  7. Pseudophakia OS  - s/p CE/IOL OS (June 2020, [facility]. [facility])  - beautiful surgery with IOL in excellent position  - tr CME/cystic changes as discussed above  - due to second recurrence of CME, recommend long term mainteancne use of Pred Forte and Prolensa -- BID OS  - monitor  8. Dry eyes OU  - recommend artificial tears and lubricating ointment as needed (every 1-2 hours is fine)  - stopped xiidra -- now on Restasis   Ophthalmic Meds Ordered this visit:  No orders of the defined types were placed in this encounter.      Return for f/u 6-8 weeks, FTMH OS, DFE, OCT.  There are no Patient Instructions on file for this visit.  This document serves as a record of services personally performed by Gardiner Sleeper, MD, PhD. It was created on their behalf by Leeann Must, [place], an ophthalmic technician.  The creation of this record is the provider's dictation and/or activities during the visit.    Electronically signed by: Leeann Must, [place] 09.16.2021 9:14 AM   This document serves as a record of services personally performed by Gardiner Sleeper, MD, PhD. It was created on their behalf by [place]. Owens Shark, OA an ophthalmic technician. The creation of this record is the provider's dictation and/or activities during the visit.    Electronically signed by: [place] Jet[place]. Owens Shark, [place] 09.20.2021 9:14 AM  Gardiner Sleeper, M.D., Ph.D. Diseases & Surgery of the [facility] 03/09/2020   I have reviewed the above documentation for accuracy and completeness, and I agree with the above. Gardiner Sleeper, M.D., Ph.D. 03/09/20 9:15 AM   Abbreviations: M myopia (nearsighted); A astigmatism; H hyperopia (farsighted); P presbyopia; Mrx spectacle prescription;  CTL contact lenses; OD right eye; OS left eye; OU both eyes  XT exotropia; ET esotropia; PEK punctate epithelial keratitis; PEE punctate epithelial  erosions; DES dry eye syndrome; MGD meibomian gland dysfunction; ATs artificial tears; PFAT's preservative free artificial tears; [place] nuclear sclerotic cataract; PSC posterior subcapsular cataract; ERM epi-retinal membrane; PVD posterior vitreous detachment; RD retinal detachment; DM diabetes mellitus; DR diabetic retinopathy; NPDR non-proliferative diabetic retinopathy; PDR proliferative diabetic retinopathy; CSME clinically significant macular edema; DME diabetic macular edema; dbh dot blot hemorrhages; CWS cotton wool spot; POAG primary open angle glaucoma; C/D cup-to-disc ratio; HVF humphrey visual field; GVF goldmann visual field; OCT optical coherence tomography; IOP intraocular pressure; BRVO Branch retinal vein occlusion; CRVO central retinal vein occlusion; CRAO central retinal artery occlusion; BRAO branch retinal artery occlusion; RT retinal tear; SB scleral buckle; PPV pars plana vitrectomy; VH Vitreous hemorrhage; PRP panretinal laser photocoagulation; IVK intravitreal kenalog; VMT vitreomacular traction; MH Macular hole;  NVD neovascularization of the disc; NVE neovascularization elsewhere; AREDS age related eye disease study; ARMD age related macular degeneration; POAG primary open angle glaucoma; EBMD epithelial/anterior basement membrane dystrophy; ACIOL anterior chamber intraocular lens; IOL intraocular lens; PCIOL posterior chamber intraocular lens; Phaco/IOL phacoemulsification with intraocular lens placement; [place] photorefractive keratectomy; LASIK laser assisted in situ keratomileusis; HTN hypertension; DM diabetes mellitus; COPD chronic obstructive pulmonary disease

## 2020-03-09 ENCOUNTER — Ambulatory Visit (INDEPENDENT_AMBULATORY_CARE_PROVIDER_SITE_OTHER): Payer: Medicare PPO | Admitting: Ophthalmology

## 2020-03-09 ENCOUNTER — Other Ambulatory Visit: Payer: Self-pay

## 2020-03-09 ENCOUNTER — Encounter (INDEPENDENT_AMBULATORY_CARE_PROVIDER_SITE_OTHER): Payer: Self-pay | Admitting: Ophthalmology

## 2020-03-09 DIAGNOSIS — I10 Essential (primary) hypertension: Secondary | ICD-10-CM

## 2020-03-09 DIAGNOSIS — H35342 Macular cyst, hole, or pseudohole, left eye: Secondary | ICD-10-CM

## 2020-03-09 DIAGNOSIS — H3581 Retinal edema: Secondary | ICD-10-CM

## 2020-03-09 DIAGNOSIS — H04123 Dry eye syndrome of bilateral lacrimal glands: Secondary | ICD-10-CM

## 2020-03-09 DIAGNOSIS — H35352 Cystoid macular degeneration, left eye: Secondary | ICD-10-CM

## 2020-03-09 DIAGNOSIS — Z961 Presence of intraocular lens: Secondary | ICD-10-CM

## 2020-03-09 DIAGNOSIS — H25813 Combined forms of age-related cataract, bilateral: Secondary | ICD-10-CM

## 2020-03-09 DIAGNOSIS — H25811 Combined forms of age-related cataract, right eye: Secondary | ICD-10-CM

## 2020-03-09 DIAGNOSIS — H35033 Hypertensive retinopathy, bilateral: Secondary | ICD-10-CM

## 2020-04-16 NOTE — Progress Notes (Signed)
[facility] Note  04/20/2020     CHIEF COMPLAINT Patient presents for Retina Follow Up   HISTORY OF PRESENT ILLNESS: Dominique Lee is a 69 y.o. female who presents to the clinic today for:   HPI    Retina Follow Up    Patient presents with  Other.  In left eye.  This started weeks ago.  Severity is moderate.  Duration of weeks.  Since onset it is stable.  I, the attending physician,  performed the HPI with the patient and updated documentation appropriately.          Comments    Pt states vision is about the same OU.  Patient denies eye pain or discomfort and denies any new or worsening floaters or fol OU.       Last edited by Bernarda Caffey, MD on 04/20/2020  9:26 AM. (History)    pt states vision seems the same, she states she has been off all drops for 3 weeks, pt is still using Restasis and AT's as needed   Referring physician: No referring provider defined for this encounter.  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 bacitracin-polymyxin b (POLYSPORIN) ophthalmic ointment Place into the left eye 4 (four) times daily. Place a 1/2 inch ribbon of ointment into the lower eyelid. (Patient not taking: Reported on 04/15/2019)  . bromfenac (XIBROM) 0.09 % ophthalmic solution INSTILL 1 DROP OF SOLUTION INTO LEFT EYE TWICE DAILY (Patient not taking: Reported on 12/06/2019)  . Bromfenac Sodium (PROLENSA) 0.07 % SOLN Place 1 drop into the left eye 2 (two) times daily.  . prednisoLONE acetate (PRED FORTE) 1 % ophthalmic suspension Place 1 drop into the left eye 2 (two) times daily.  Marland Kitchen Propylene Glycol (SYSTANE COMPLETE) 0.6 % SOLN Place 1 drop into both eyes 3 (three) times daily as needed (dry/irritated eyes.).  Marland Kitchen RESTASIS 0.05 % ophthalmic emulsion Place 1  drop into both eyes in the morning and at bedtime.  Marland Kitchen XIIDRA 5 % SOLN Apply 1 drop to eye 2 (two) times daily. (Patient not taking: Reported on 12/06/2019)   No current facility-administered medications for this visit. (Ophthalmic Drugs)   Current Outpatient Medications (Other)  Medication Sig  . acetaminophen (TYLENOL) 500 MG tablet Take 1,000 mg by mouth at bedtime.  . Cholecalciferol (VITAMIN D-1000 MAX ST) 25 MCG (1000 UT) tablet Take 1,000 Units by mouth daily with lunch.  . cyclobenzaprine (FEXMID) 7.5 MG tablet Take 1 tablet (7.5 mg total) by mouth 3 (three) times daily as needed for muscle spasms.  Marland Kitchen enoxaparin (LOVENOX) 40 MG/0.4ML injection Inject 0.4 mLs (40 mg total) into the skin daily.  . fluticasone (FLONASE) 50 MCG/ACT nasal spray Place 1 spray into both nostrils daily as needed for allergies or rhinitis.  . folic acid (FOLVITE) 1 MG tablet Take 1 mg by mouth daily with lunch.   . ibuprofen (ADVIL,MOTRIN) 200 MG tablet Take 1 tablet (200 mg total) by mouth 3 (three) times daily.  Marland Kitchen leflunomide (ARAVA) 10 MG tablet Take 10 mg by mouth daily with lunch.   . levothyroxine (SYNTHROID, LEVOTHROID) 75 MCG tablet Take 75 mcg by mouth daily before breakfast.   . methotrexate 2.5 MG tablet Take 15 mg by mouth every Thursday. Thursdays with evening meal  . metoprolol succinate (TOPROL-XL) 50  MG 24 hr tablet Take 1-1.5 tablets (50-75 mg total) by mouth daily. (Patient taking differently: Take 50 mg by mouth at bedtime. )  . omeprazole (PRILOSEC) 20 MG capsule Take 1 capsule (20 mg total) by mouth daily for 14 days. (Patient not taking: Reported on 06/08/2018)  . ondansetron (ZOFRAN) 4 MG tablet   . polyethylene glycol (MIRALAX / GLYCOLAX) packet Take 17 g by mouth daily as needed for mild constipation.   No current facility-administered medications for this visit. (Other)      REVIEW OF SYSTEMS: ROS    Positive for: Musculoskeletal, Endocrine, Cardiovascular, Eyes, Respiratory    Negative for: Constitutional, Gastrointestinal, Neurological, Skin, Genitourinary, HENT, Psychiatric, Allergic/Imm, Heme/Lymph   Last edited by Doneen Poisson on 04/20/2020  7:54 AM. (History)       ALLERGIES Allergies  Allergen Reactions  . Codeine Other (See Comments)    Agitation   . Morphine And Related Other (See Comments)    agitation   . Statins Other (See Comments)    Joint  pain    PAST MEDICAL HISTORY Past Medical History:  Diagnosis Date  . Cancer (HCC)    Basal cell- skin  . Cataract    OD  . Complication of anesthesia   . COPD (chronic obstructive pulmonary disease) ([place])   . Dysrhythmia    [name]  . Family history of adverse reaction to anesthesia    Mother N/V  . Fibromyalgia   . High cholesterol   . Hypertensive retinopathy    OU  . Hypothyroidism   . Irregular heart beat   . Osteoarthritis   . Pneumothorax after biopsy   . PONV (postoperative nausea and vomiting)   . [name] (premature ventricular contraction)   . RA (rheumatoid arthritis) ([place])   . Thyroid disease    Hypothyroid   Past Surgical History:  Procedure Laterality Date  . [place] VITRECTOMY WITH 20 GAUGE MVR PORT FOR MACULAR HOLE Left 06/21/2018   Procedure: 25 GAUGE PARS PLANA VITRECTOMY WITH 20 GAUGE MVR PORT FOR MACULAR HOLE;  Surgeon: Bernarda Caffey, MD;  Location: [facility];  Service: Ophthalmology;  Laterality: Left;  . ABDOMINAL HYSTERECTOMY  2000  . BREAST SURGERY     saline implants  . CARDIOVASCULAR STRESS TEST  09/02/2005   Cardiolite Myocardial perfusion study; NegativeBruce protocol exercise stress test  . CATARACT EXTRACTION Left 11/2018   Dr. Midge Aver  . EYE SURGERY Left    macular hole repair  . GAS/FLUID EXCHANGE Left 06/21/2018   Procedure: GAS/FLUID EXCHANGE;  Surgeon: Bernarda Caffey, MD;  Location: [facility];  Service: Ophthalmology;  Laterality: Left;  C3F8  . HIP PINNING,CANNULATED Right 12/18/2017   Procedure: CANNULATED HIP PINNING;  Surgeon: Hiram Gash, MD;   Location: [facility];  Service: Orthopedics;  Laterality: Right;  . LAPAROSCOPIC PARTIAL COLECTOMY Left 05/18/2017  . Lung Biospy    . MEMBRANE PEEL Left 06/21/2018   Procedure: MEMBRANE PEEL;  Surgeon: Bernarda Caffey, MD;  Location: [facility];  Service: Ophthalmology;  Laterality: Left;  . NASAL SINUS SURGERY    . PHOTOCOAGULATION WITH LASER Left 06/21/2018   Procedure: PHOTOCOAGULATION WITH LASER;  Surgeon: Bernarda Caffey, MD;  Location: [facility];  Service: Ophthalmology;  Laterality: Left;  . TUBAL LIGATION    . US ECHOCARDIOGRAPHY  12/26/2011   mild abnormalities    FAMILY HISTORY Family History  Problem Relation Age of Onset  . Hypertension Mother   . Cancer - Lung Father 37  deceased  . Heart attack Maternal Grandmother   . Cancer Maternal Grandfather   . Diabetes Paternal Grandfather   . Diabetes Brother   . Cancer - Colon Sister     SOCIAL HISTORY Social History   Tobacco Use  . Smoking status: Former Smoker    Types: Cigarettes    Quit date: 11/04/2002    Years since quitting: 17.4  . Smokeless tobacco: Never Used  Vaping Use  . Vaping Use: Never used  Substance Use Topics  . Alcohol use: No  . Drug use: No         OPHTHALMIC EXAM:  Base Eye Exam    Visual Acuity (Snellen - Linear)      Right Left   Dist cc 20/20 -1 20/30 -2   Correction: Glasses       Tonometry (Tonopen, 7:58 AM)      Right Left   Pressure 12 12       Pupils      Dark Light Shape React APD   Right 3 2 Round Brisk 0   Left 3 2 Round Brisk 0       Visual Fields      Left Right    Full Full       Extraocular Movement      Right Left    Full Full       Neuro/Psych    Oriented x3: Yes   Mood/Affect: Normal       Dilation    Both eyes: 1.0% Mydriacyl, 2.5% Phenylephrine @ 7:58 AM        Slit Lamp and Fundus Exam    Slit Lamp Exam      Right Left   Lids/Lashes Dermatochalasis - upper lid Dermatochalasis - upper lid   Conjunctiva/Sclera White and quiet nasal and temporla  Pinguecula   Cornea Trace Punctate epithelial erosions, trace Debris in tear film arcus, 1+inferior Punctate epithelial erosions, Well healed cataract wounds   Anterior Chamber Deep and quiet Deep, 0.5+cell/pigment   Iris Round and dilated Round and well dilated   Lens 2+ Nuclear sclerosis, 2+ Cortical cataract PC IOL in good position with open PC   Vitreous Vitreous syneresis, Posterior vitreous detachment post vitrectomy; 0.5+pigment       Fundus Exam      Right Left   Disc Pink and Sharp trace pallor, sharp rim   C/D Ratio 0.5 0.5   Macula Flat, Blunted foveal reflex, RPE mottling and clumping, No heme or edema flat; mac hole closed, good foveal reflex, mild persistent trace cystic changes centrally, trace Retinal pigment epithelial mottling, No heme   Vessels attenuated, mild tortuousity, mild AV crossing changes Vascular attenuation, AV crossing changes   Periphery Attached, No RT/RD Attached, good laser 360        Refraction    Wearing Rx      Sphere Cylinder Axis Add   Right +0.75 +1.25 028 +2.25   Left -0.25 +1.00 028 +2.25          IMAGING AND PROCEDURES  Imaging and Procedures for _0 @  OCT, Retina - OU - Both Eyes       Right Eye Quality was good. Central Foveal Thickness: 251. Progression has been stable. Findings include normal foveal contour, no SRF, no IRF, outer retinal atrophy (Focal ellipsoid disruption superior to fovea --persistent/stable).   Left Eye Quality was good. Central Foveal Thickness: 358. Progression has been stable. Findings include no SRF, intraretinal fluid, abnormal foveal contour (Persistent cystic  changes temporal).   Notes *Images captured and stored on drive  Diagnosis / Impression:  OD: NFP, No IRF/SRF; Focal ellipsoid disruption superior to fovea -- persistent/stable OS: Mac hole closed; irregular inner retinal surface--stable; persistent cystic changes temporal  Clinical management:  See below  Abbreviations: NFP - Normal  foveal profile. CME - cystoid macular edema. PED - pigment epithelial detachment. IRF - intraretinal fluid. SRF - subretinal fluid. EZ - ellipsoid zone. ERM - epiretinal membrane. ORA - outer retinal atrophy. ORT - outer retinal tubulation. SRHM - subretinal hyper-reflective material                 ASSESSMENT/PLAN:    ICD-10-CM   1. Macular hole of left eye  H35.342   2. Cystoid macular edema of left eye  H35.352   3. Essential hypertension  I10   4. Retinal edema  H35.81 OCT, Retina - OU - Both Eyes  5. Combined forms of age-related cataract of right eye  H25.811   6. Pseudophakia  Z96.1   7. Hypertensive retinopathy of both eyes  H35.033   8. Dry eyes  H04.123   9. Combined forms of age-related cataract of both eyes  H25.813    1-3. Full Thickness Macular Hole OS  - preop BCVA 20/100 w/ central scotoma OS  - s/p PPV/ICG/Membrane peel/C3F8, 01.02.20  - doing well with macular hole closed and ellipsoid signal improved back to baseline essentially  - BCVA: 20/30 -- stable  - mild persistent cystic changes, post PF/prolensa taper -- stopped 3 wks ago  - OCT shows persistent cystic changes temporal  - IOP okay at 12 OS  - F/U 3 months -- CME check -- DFE/OCT  4,5. Hypertensive retinopathy OU  - discussed importance of tight BP control  - monitor  6. 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  - discussed diagnosis and progression  - under the expert management of Dr. Shirleen Schirmer  7. Pseudophakia OS  - s/p CE/IOL OS (June 2020, C. Groat)  - beautiful surgery with IOL in excellent position  - tr CME/cystic changes as discussed above  - monitor  8. Dry eyes OU  - recommend artificial tears and lubricating ointment as needed (every 1-2 hours is fine)  - stopped xiidra -- now on Restasis   Ophthalmic Meds Ordered this visit:  No orders of the defined types were placed in this  encounter.      Return in about 3 months (around 07/21/2020) for f/u CME check OS, DFE, OCT.  There are no Patient Instructions on file for this visit.  This document serves as a record of services personally performed by Gardiner Sleeper, MD, PhD. It was created on their behalf by Leeann Must, [place], an ophthalmic technician. The creation of this record is the provider's dictation and/or activities during the visit.    Electronically signed by: Leeann Must, [place] 10.28.2021 9:29 AM   This document serves as a record of services personally performed by Gardiner Sleeper, MD, PhD. It was created on their behalf by San Jetty. Owens Shark, OA an ophthalmic technician. The creation of this record is the provider's dictation and/or activities during the visit.    Electronically signed by: San Jetty. Owens Shark, [place] 11.01.2021 9:29 AM  Gardiner Sleeper, M.D., Ph.D. Diseases & Surgery of the Retina and Vitreous [facility] 04/20/2020   I have reviewed the  above documentation for accuracy and completeness, and I agree with the above. Gardiner Sleeper, M.D., Ph.D. 04/20/20 9:29 AM   Abbreviations: M myopia (nearsighted); A astigmatism; H hyperopia (farsighted); P presbyopia; Mrx spectacle prescription;  CTL contact lenses; OD right eye; OS left eye; OU both eyes  XT exotropia; ET esotropia; PEK punctate epithelial keratitis; PEE punctate epithelial erosions; DES dry eye syndrome; MGD meibomian gland dysfunction; ATs artificial tears; PFAT's preservative free artificial tears; [place] nuclear sclerotic cataract; PSC posterior subcapsular cataract; ERM epi-retinal membrane; PVD posterior vitreous detachment; RD retinal detachment; DM diabetes mellitus; DR diabetic retinopathy; NPDR non-proliferative diabetic retinopathy; PDR proliferative diabetic retinopathy; CSME clinically significant macular edema; DME diabetic macular edema; dbh dot blot hemorrhages; CWS cotton wool spot; POAG primary open angle  glaucoma; C/D cup-to-disc ratio; HVF humphrey visual field; GVF goldmann visual field; OCT optical coherence tomography; IOP intraocular pressure; BRVO Branch retinal vein occlusion; CRVO central retinal vein occlusion; CRAO central retinal artery occlusion; BRAO branch retinal artery occlusion; RT retinal tear; SB scleral buckle; PPV pars plana vitrectomy; VH Vitreous hemorrhage; PRP panretinal laser photocoagulation; IVK intravitreal kenalog; VMT vitreomacular traction; MH Macular hole;  NVD neovascularization of the disc; NVE neovascularization elsewhere; AREDS age related eye disease study; ARMD age related macular degeneration; POAG primary open angle glaucoma; EBMD epithelial/anterior basement membrane dystrophy; ACIOL anterior chamber intraocular lens; IOL intraocular lens; PCIOL posterior chamber intraocular lens; Phaco/IOL phacoemulsification with intraocular lens placement; [place] photorefractive keratectomy; LASIK laser assisted in situ keratomileusis; HTN hypertension; DM diabetes mellitus; COPD chronic obstructive pulmonary disease

## 2020-04-20 ENCOUNTER — Encounter (INDEPENDENT_AMBULATORY_CARE_PROVIDER_SITE_OTHER): Payer: Self-pay | Admitting: Ophthalmology

## 2020-04-20 ENCOUNTER — Other Ambulatory Visit: Payer: Self-pay

## 2020-04-20 ENCOUNTER — Ambulatory Visit (INDEPENDENT_AMBULATORY_CARE_PROVIDER_SITE_OTHER): Payer: Medicare PPO | Admitting: Ophthalmology

## 2020-04-20 DIAGNOSIS — H04123 Dry eye syndrome of bilateral lacrimal glands: Secondary | ICD-10-CM

## 2020-04-20 DIAGNOSIS — H3581 Retinal edema: Secondary | ICD-10-CM | POA: Diagnosis not present

## 2020-04-20 DIAGNOSIS — H35342 Macular cyst, hole, or pseudohole, left eye: Secondary | ICD-10-CM

## 2020-04-20 DIAGNOSIS — H35352 Cystoid macular degeneration, left eye: Secondary | ICD-10-CM | POA: Diagnosis not present

## 2020-04-20 DIAGNOSIS — I10 Essential (primary) hypertension: Secondary | ICD-10-CM | POA: Diagnosis not present

## 2020-04-20 DIAGNOSIS — H35033 Hypertensive retinopathy, bilateral: Secondary | ICD-10-CM

## 2020-04-20 DIAGNOSIS — Z961 Presence of intraocular lens: Secondary | ICD-10-CM

## 2020-04-20 DIAGNOSIS — H25813 Combined forms of age-related cataract, bilateral: Secondary | ICD-10-CM

## 2020-04-20 DIAGNOSIS — H25811 Combined forms of age-related cataract, right eye: Secondary | ICD-10-CM

## 2020-07-16 NOTE — Progress Notes (Signed)
[facility] Note  07/21/2020     CHIEF COMPLAINT Patient presents for Retina Follow Up   HISTORY OF PRESENT ILLNESS: Dominique Lee is a 70 y.o. female who presents to the clinic today for:   HPI    Retina Follow Up    Patient presents with  Other.  In left eye.  This started weeks ago.  Severity is moderate.  Duration of weeks.  Since onset it is gradually worsening.  I, the attending physician,  performed the HPI with the patient and updated documentation appropriately.          Comments    Using:  PF Thera tears TID OU Pt states vision seems to be worsening OU.  Patient denies eye pain.  Pt stopped restasis--said it burned when she used it and made her eyes red and was having to supplement with artificial tears.  Patient denies any new or worsening floaters or fol OU.        Last edited by Bernarda Caffey, MD on 07/21/2020  8:50 AM. (History)    pt states she feels like her vision has gotten worse in her left eye, pt is no longer using Restasis, she states it was burning her eyes and making them red, she is using PF Thera Tears, pt saw Dr. Katy Fitch a few weeks ago and got new glasses, she states he did not mention her right cataract   Referring physician: No referring provider defined for this encounter.  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 bacitracin-polymyxin b (POLYSPORIN) ophthalmic ointment Place into the left eye 4 (four) times daily. Place a 1/2 inch ribbon of ointment into the lower eyelid. (Patient not taking: Reported on 04/15/2019)  . bromfenac (XIBROM) 0.09 % ophthalmic solution INSTILL 1 DROP OF SOLUTION INTO LEFT EYE TWICE DAILY (Patient not taking: Reported on 12/06/2019)  . Bromfenac Sodium (PROLENSA) 0.07 % SOLN Place 1 drop into the  left eye 2 (two) times daily.  . prednisoLONE acetate (PRED FORTE) 1 % ophthalmic suspension Place 1 drop into the left eye 2 (two) times daily.  Marland Kitchen Propylene Glycol (SYSTANE COMPLETE) 0.6 % SOLN Place 1 drop into both eyes 3 (three) times daily as needed (dry/irritated eyes.).  Marland Kitchen RESTASIS 0.05 % ophthalmic emulsion Place 1 drop into both eyes in the morning and at bedtime. (Patient not taking: Reported on 07/21/2020)  . XIIDRA 5 % SOLN Apply 1 drop to eye 2 (two) times daily. (Patient not taking: No sig reported)   No current facility-administered medications for this visit. (Ophthalmic Drugs)   Current Outpatient Medications (Other)  Medication Sig  . acetaminophen (TYLENOL) 500 MG tablet Take 1,000 mg by mouth at bedtime.  . Cholecalciferol (VITAMIN D-1000 MAX ST) 25 MCG (1000 UT) tablet Take 1,000 Units by mouth daily with lunch.  . cyclobenzaprine (FEXMID) 7.5 MG tablet Take 1 tablet (7.5 mg total) by mouth 3 (three) times daily as needed for muscle spasms.  Marland Kitchen enoxaparin (LOVENOX) 40 MG/0.4ML injection Inject 0.4 mLs (40 mg total) into the skin daily.  . fluticasone (FLONASE) 50 MCG/ACT nasal spray Place 1 spray into both nostrils daily as needed for allergies or rhinitis.  . folic acid (FOLVITE) 1 MG tablet Take 1 mg by mouth daily with lunch.   . ibuprofen (ADVIL,MOTRIN) 200 MG  tablet Take 1 tablet (200 mg total) by mouth 3 (three) times daily.  Marland Kitchen leflunomide (ARAVA) 10 MG tablet Take 10 mg by mouth daily with lunch.   . levothyroxine (SYNTHROID, LEVOTHROID) 75 MCG tablet Take 75 mcg by mouth daily before breakfast.   . methotrexate 2.5 MG tablet Take 15 mg by mouth every Thursday. Thursdays with evening meal  . metoprolol succinate (TOPROL-XL) 50 MG 24 hr tablet Take 1-1.5 tablets (50-75 mg total) by mouth daily. (Patient taking differently: Take 50 mg by mouth at bedtime. )  . omeprazole (PRILOSEC) 20 MG capsule Take 1 capsule (20 mg total) by mouth daily for 14 days. (Patient not taking:  Reported on 06/08/2018)  . ondansetron (ZOFRAN) 4 MG tablet   . polyethylene glycol (MIRALAX / GLYCOLAX) packet Take 17 g by mouth daily as needed for mild constipation.   No current facility-administered medications for this visit. (Other)      REVIEW OF SYSTEMS: ROS    Positive for: Musculoskeletal, Endocrine, Cardiovascular, Eyes, Respiratory   Negative for: Constitutional, Gastrointestinal, Neurological, Skin, Genitourinary, HENT, Psychiatric, Allergic/Imm, Heme/Lymph   Last edited by Doneen Poisson on 07/21/2020  7:52 AM. (History)       ALLERGIES Allergies  Allergen Reactions  . Codeine Other (See Comments)    Agitation   . Morphine And Related Other (See Comments)    agitation   . Statins Other (See Comments)    Joint  pain    PAST MEDICAL HISTORY Past Medical History:  Diagnosis Date  . Cancer (HCC)    Basal cell- skin  . Cataract    OD  . Complication of anesthesia   . COPD (chronic obstructive pulmonary disease) ([place])   . Dysrhythmia    [name]  . Family history of adverse reaction to anesthesia    Mother N/V  . Fibromyalgia   . High cholesterol   . Hypertensive retinopathy    OU  . Hypothyroidism   . Irregular heart beat   . Osteoarthritis   . Pneumothorax after biopsy   . PONV (postoperative nausea and vomiting)   . [name] (premature ventricular contraction)   . RA (rheumatoid arthritis) ([place])   . Thyroid disease    Hypothyroid   Past Surgical History:  Procedure Laterality Date  . [place] VITRECTOMY WITH 20 GAUGE MVR PORT FOR MACULAR HOLE Left 06/21/2018   Procedure: 25 GAUGE PARS PLANA VITRECTOMY WITH 20 GAUGE MVR PORT FOR MACULAR HOLE;  Surgeon: Bernarda Caffey, MD;  Location: [place];  Service: Ophthalmology;  Laterality: Left;  . ABDOMINAL HYSTERECTOMY  2000  . BREAST SURGERY     saline implants  . CARDIOVASCULAR STRESS TEST  09/02/2005   Cardiolite Myocardial perfusion study; NegativeBruce protocol exercise stress test  . CATARACT  EXTRACTION Left 11/2018   Dr. Midge Aver  . EYE SURGERY Left    macular hole repair  . GAS/FLUID EXCHANGE Left 06/21/2018   Procedure: GAS/FLUID EXCHANGE;  Surgeon: Bernarda Caffey, MD;  Location: [place];  Service: Ophthalmology;  Laterality: Left;  C3F8  . HIP PINNING,CANNULATED Right 12/18/2017   Procedure: CANNULATED HIP PINNING;  Surgeon: Hiram Gash, MD;  Location: [place];  Service: Orthopedics;  Laterality: Right;  . LAPAROSCOPIC PARTIAL COLECTOMY Left 05/18/2017  . Lung Biospy    . MEMBRANE PEEL Left 06/21/2018   Procedure: MEMBRANE PEEL;  Surgeon: Bernarda Caffey, MD;  Location: [place];  Service: Ophthalmology;  Laterality: Left;  . NASAL SINUS SURGERY    . PHOTOCOAGULATION  WITH LASER Left 06/21/2018   Procedure: PHOTOCOAGULATION WITH LASER;  Surgeon: Bernarda Caffey, MD;  Location: [place];  Service: Ophthalmology;  Laterality: Left;  . TUBAL LIGATION    . US ECHOCARDIOGRAPHY  12/26/2011   mild abnormalities    FAMILY HISTORY Family History  Problem Relation Age of Onset  . Hypertension Mother   . Cancer - Lung Father 32       deceased  . Heart attack Maternal Grandmother   . Cancer Maternal Grandfather   . Diabetes Paternal Grandfather   . Diabetes Brother   . Cancer - Colon Sister     SOCIAL HISTORY Social History   Tobacco Use  . Smoking status: Former Smoker    Types: Cigarettes    Quit date: 11/04/2002    Years since quitting: 17.7  . Smokeless tobacco: Never Used  Vaping Use  . Vaping Use: Never used  Substance Use Topics  . Alcohol use: No  . Drug use: No         OPHTHALMIC EXAM:  Base Eye Exam    Visual Acuity (Snellen - Linear)      Right Left   Dist cc 20/20 -2 20/30 -2   Dist ph cc  20/30 +1   Correction: Glasses       Tonometry (Tonopen, 7:57 AM)      Right Left   Pressure 12 10       Pupils      Dark Light Shape React APD   Right 3 2 Round Brisk 0   Left 3 2 Round Brisk 0       Visual Fields      Left Right    Full Full        Extraocular Movement      Right Left    Full Full       Neuro/Psych    Oriented x3: Yes   Mood/Affect: Normal       Dilation    Both eyes: 1.0% Mydriacyl, 2.5% Phenylephrine @ 7:57 AM        Slit Lamp and Fundus Exam    Slit Lamp Exam      Right Left   Lids/Lashes Dermatochalasis - upper lid Dermatochalasis - upper lid   Conjunctiva/Sclera White and quiet nasal and temporla Pinguecula   Cornea 2+ Punctate epithelial erosions, trace Debris in tear film arcus, 2-3+inferior Punctate epithelial erosions, Well healed cataract wounds   Anterior Chamber Deep and quiet Deep and quiet   Iris Round and dilated Round and well dilated   Lens 2+ Nuclear sclerosis, 2+ Cortical cataract PC IOL in good position with open PC   Vitreous Vitreous syneresis, Posterior vitreous detachment, vitreous condensations post vitrectomy       Fundus Exam      Right Left   Disc Pink and Sharp trace pallor, sharp rim, thin superior rim   C/D Ratio 0.5 0.65   Macula Flat, Blunted foveal reflex, RPE mottling and clumping, prominent focal clump superior to fovea, No heme or edema flat; mac hole closed, blunted foveal reflex, mild persistent trace cystic changes centrally, trace Retinal pigment epithelial mottling, No heme   Vessels attenuated, mild tortuousity, mild AV crossing changes attenuated, mild tortuousity   Periphery Attached, No RT/RD Attached, good laser 360, No RT/RD        Refraction    Wearing Rx      Sphere Cylinder Axis Add   Right +0.75 +1.25 028 +2.25   Left -0.25 +1.00 028 +  2.25          IMAGING AND PROCEDURES  Imaging and Procedures for _0 @  OCT, Retina - OU - Both Eyes       Right Eye Quality was good. Central Foveal Thickness: 253. Progression has been stable. Findings include normal foveal contour, no SRF, no IRF, outer retinal atrophy (Focal ellipsoid disruption superior to fovea --persistent/slightly improved from prior; partial PVD).   Left Eye Quality was good.  Central Foveal Thickness: 372. Progression has been stable. Findings include no SRF, intraretinal fluid, abnormal foveal contour (Mac hole closed, Persistent cystic changes perifovea).   Notes *Images captured and stored on drive  Diagnosis / Impression:  OD: NFP, No IRF/SRF;Focal ellipsoid disruption superior to fovea --persistent/slightly improved from prior; partial PVD  OS: Mac hole closed, Persistent cystic changes perifovea  Clinical management:  See below  Abbreviations: NFP - Normal foveal profile. CME - cystoid macular edema. PED - pigment epithelial detachment. IRF - intraretinal fluid. SRF - subretinal fluid. EZ - ellipsoid zone. ERM - epiretinal membrane. ORA - outer retinal atrophy. ORT - outer retinal tubulation. SRHM - subretinal hyper-reflective material                 ASSESSMENT/PLAN:    ICD-10-CM   1. Macular hole of left eye  H35.342   2. Cystoid macular edema of left eye  H35.352   3. Retinal edema  H35.81 OCT, Retina - OU - Both Eyes  4. Essential hypertension  I10   5. Hypertensive retinopathy of both eyes  H35.033   6. 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 Pseudophakia  Z96.1   8. Dry eyes  H04.123    1-3. Full Thickness Macular Hole OS  - preop BCVA 20/100 w/ central scotoma OS  - s/p PPV/ICG/Membrane peel/C3F8, 01.02.20  - doing well with macular hole closed and ellipsoid signal improved back to baseline essentially  - BCVA: 20/30 -- stable  - mild persistent cystic changes on OCT, post PF/prolensa taper -- stopped in October 2021  - IOP okay at 10 OS  - F/U 4-6 months -- CME check -- DFE/OCT  4,5. Hypertensive retinopathy OU  - discussed importance of tight BP control  - monitor  6. 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  - discussed diagnosis and progression  - under the expert management of Dr. Shirleen Schirmer  7.  Pseudophakia OS  - s/p CE/IOL OS (June 2020, C. Groat)  - beautiful surgery with IOL in excellent position  - tr CME/cystic changes as discussed above  - monitor  8. Dry eyes OU  - recommend artificial tears and lubricating ointment as needed (every 1-2 hours is fine)  - stopped xiidra and Restasis, now just on PF Theratears  Ophthalmic Meds Ordered this visit:  No orders of the defined types were placed in this encounter.     Return for f/u 4-6 months, CME check s/p mac hole repair OS, DFE, OCT.  There are no Patient Instructions on file for this visit.  This document serves as a record of services personally performed by Gardiner Sleeper, MD, PhD. It was created on their behalf by Estill Bakes, COT an ophthalmic technician. The creation of this record is the provider's dictation and/or activities during the visit.    Electronically signed by: Estill Bakes, COT 1.27.22 @ 8:55 AM   This document  serves as a record of services personally performed by Gardiner Sleeper, MD, PhD. It was created on their behalf by San Jetty. Owens Shark, OA an ophthalmic technician. The creation of this record is the provider's dictation and/or activities during the visit.    Electronically signed by: San Jetty. Owens Shark, [place] 02.01.2022 8:55 AM  Gardiner Sleeper, M.D., Ph.D. Diseases & Surgery of the Retina and [place] 07/21/2020   I have reviewed the above documentation for accuracy and completeness, and I agree with the above. Gardiner Sleeper, M.D., Ph.D. 07/21/20 8:55 AM  Abbreviations: M myopia (nearsighted); A astigmatism; H hyperopia (farsighted); P presbyopia; Mrx spectacle prescription;  CTL contact lenses; OD right eye; OS left eye; OU both eyes  XT exotropia; ET esotropia; PEK punctate epithelial keratitis; PEE punctate epithelial erosions; DES dry eye syndrome; MGD meibomian gland dysfunction; ATs artificial tears; PFAT's preservative free artificial tears; [place] nuclear  sclerotic cataract; PSC posterior subcapsular cataract; ERM epi-retinal membrane; PVD posterior vitreous detachment; RD retinal detachment; DM diabetes mellitus; DR diabetic retinopathy; NPDR non-proliferative diabetic retinopathy; PDR proliferative diabetic retinopathy; CSME clinically significant macular edema; DME diabetic macular edema; dbh dot blot hemorrhages; CWS cotton wool spot; POAG primary open angle glaucoma; C/D cup-to-disc ratio; HVF humphrey visual field; GVF goldmann visual field; OCT optical coherence tomography; IOP intraocular pressure; BRVO Branch retinal vein occlusion; CRVO central retinal vein occlusion; CRAO central retinal artery occlusion; BRAO branch retinal artery occlusion; RT retinal tear; SB scleral buckle; PPV pars plana vitrectomy; VH Vitreous hemorrhage; PRP panretinal laser photocoagulation; IVK intravitreal kenalog; VMT vitreomacular traction; MH Macular hole;  NVD neovascularization of the disc; NVE neovascularization elsewhere; AREDS age related eye disease study; ARMD age related macular degeneration; POAG primary open angle glaucoma; EBMD epithelial/anterior basement membrane dystrophy; ACIOL anterior chamber intraocular lens; IOL intraocular lens; PCIOL posterior chamber intraocular lens; Phaco/IOL phacoemulsification with intraocular lens placement; [place] photorefractive keratectomy; LASIK laser assisted in situ keratomileusis; HTN hypertension; DM diabetes mellitus; COPD chronic obstructive pulmonary disease

## 2020-07-21 ENCOUNTER — Ambulatory Visit (INDEPENDENT_AMBULATORY_CARE_PROVIDER_SITE_OTHER): Payer: Medicare PPO | Admitting: Ophthalmology

## 2020-07-21 ENCOUNTER — Encounter (INDEPENDENT_AMBULATORY_CARE_PROVIDER_SITE_OTHER): Payer: Self-pay | Admitting: Ophthalmology

## 2020-07-21 ENCOUNTER — Other Ambulatory Visit: Payer: Self-pay

## 2020-07-21 DIAGNOSIS — H35342 Macular cyst, hole, or pseudohole, left eye: Secondary | ICD-10-CM

## 2020-07-21 DIAGNOSIS — Z961 Presence of intraocular lens: Secondary | ICD-10-CM

## 2020-07-21 DIAGNOSIS — H35352 Cystoid macular degeneration, left eye: Secondary | ICD-10-CM

## 2020-07-21 DIAGNOSIS — H3581 Retinal edema: Secondary | ICD-10-CM | POA: Diagnosis not present

## 2020-07-21 DIAGNOSIS — I10 Essential (primary) hypertension: Secondary | ICD-10-CM

## 2020-07-21 DIAGNOSIS — H04123 Dry eye syndrome of bilateral lacrimal glands: Secondary | ICD-10-CM

## 2020-07-21 DIAGNOSIS — H35033 Hypertensive retinopathy, bilateral: Secondary | ICD-10-CM

## 2020-07-21 DIAGNOSIS — H25811 Combined forms of age-related cataract, right eye: Secondary | ICD-10-CM

## 2020-12-14 NOTE — Progress Notes (Signed)
[facility] Note  12/15/2020     CHIEF COMPLAINT Patient presents for Retina Follow Up   HISTORY OF PRESENT ILLNESS: Dominique Lee is a 70 y.o. female who presents to the clinic today for:   HPI     Retina Follow Up   Patient presents with  Other.  In left eye.  Duration of 5 months.  Since onset it is stable.  I, the attending physician,  performed the HPI with the patient and updated documentation appropriately.        Comments   5 month follow up CME OS- Vision appears stable OU.  She is using Thera Tears which has helped with her dry eyes.       Last edited by Bernarda Caffey, MD on 12/15/2020 12:14 PM.    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   bacitracin-polymyxin b (POLYSPORIN) ophthalmic ointment Place into the left eye 4 (four) times daily. Place a 1/2 inch ribbon of ointment into the lower eyelid. (Patient not taking: No sig reported)   bromfenac (XIBROM) 0.09 % ophthalmic solution INSTILL 1 DROP OF SOLUTION INTO LEFT EYE TWICE DAILY (Patient not taking: No sig reported)   Bromfenac Sodium (PROLENSA) 0.07 % SOLN Place 1 drop into the left eye 2 (two) times daily. (Patient not taking: Reported on 12/15/2020)   prednisoLONE acetate (PRED FORTE) 1 % ophthalmic suspension Place 1 drop into the left eye 2 (two) times daily. (Patient not taking: Reported on 12/15/2020)   RESTASIS 0.05 % ophthalmic emulsion Place 1  drop into both eyes in the morning and at bedtime. (Patient not taking: No sig reported)   XIIDRA 5 % SOLN Apply 1 drop to eye 2 (two) times daily. (Patient not taking: No sig reported)   No current facility-administered medications for this visit. (Ophthalmic Drugs)   Current Outpatient Medications (Other)  Medication Sig   acetaminophen (TYLENOL) 500 MG tablet Take 1,000 mg by mouth at bedtime.   Cholecalciferol 25 MCG (1000 UT) tablet Take 1,000 Units by mouth daily with lunch.   fluticasone (FLONASE) 50 MCG/ACT nasal spray Place 1 spray into both nostrils daily as needed for allergies or rhinitis.   folic acid (FOLVITE) 1 MG tablet Take 1 mg by mouth daily with lunch.    leflunomide (ARAVA) 10 MG tablet Take 10 mg by mouth daily with lunch.    levothyroxine (SYNTHROID, LEVOTHROID) 75 MCG tablet Take 75 mcg by mouth daily before breakfast.    methotrexate 2.5 MG tablet Take 15 mg by mouth every Thursday. Thursdays with evening meal   metoprolol succinate (TOPROL-XL) 50 MG 24 hr tablet Take 1-1.5 tablets (50-75 mg total) by mouth daily. (Patient taking differently: Take 50 mg by mouth at bedtime.)   cyclobenzaprine (FEXMID) 7.5 MG tablet Take 1 tablet (7.5 mg total) by mouth 3 (three) times daily as needed for muscle spasms. (Patient not taking: Reported on 12/15/2020)   enoxaparin (LOVENOX) 40 MG/0.4ML injection Inject 0.4  mLs (40 mg total) into the skin daily.   ibuprofen (ADVIL,MOTRIN) 200 MG tablet Take 1 tablet (200 mg total) by mouth 3 (three) times daily. (Patient not taking: Reported on 12/15/2020)   omeprazole (PRILOSEC) 20 MG capsule Take 1 capsule (20 mg total) by mouth daily for 14 days. (Patient not taking: Reported on 06/08/2018)   ondansetron (ZOFRAN) 4 MG tablet  (Patient not taking: Reported on 12/15/2020)   polyethylene glycol (MIRALAX / GLYCOLAX) packet Take 17 g by mouth daily as needed for mild constipation. (Patient not taking: Reported on 12/15/2020)   No current  facility-administered medications for this visit. (Other)      REVIEW OF SYSTEMS: ROS   Positive for: Musculoskeletal, Endocrine, Cardiovascular, Eyes, Respiratory Negative for: Constitutional, Gastrointestinal, Neurological, Skin, Genitourinary, HENT, Psychiatric, Allergic/Imm, Heme/Lymph Last edited by Leonie Douglas, COA on 12/15/2020  9:10 AM.        ALLERGIES Allergies  Allergen Reactions   Codeine Other (See Comments)    Agitation    Morphine And Related Other (See Comments)    agitation    Statins Other (See Comments)    Joint  pain    PAST MEDICAL HISTORY Past Medical History:  Diagnosis Date   Cancer ([place])    Basal cell- skin   Cataract    OD   Complication of anesthesia    COPD (chronic obstructive pulmonary disease) ([facility])    Dysrhythmia    [name]   Family history of adverse reaction to anesthesia    Mother N/V   Fibromyalgia    High cholesterol    Hypertensive retinopathy    OU   Hypothyroidism    Irregular heart beat    Osteoarthritis    Pneumothorax after biopsy    PONV (postoperative nausea and vomiting)    [name] (premature ventricular contraction)    RA (rheumatoid arthritis) ([facility])    Thyroid disease    Hypothyroid   Past Surgical History:  Procedure Laterality Date   25 GAUGE PARS PLANA VITRECTOMY WITH 20 GAUGE MVR PORT FOR MACULAR HOLE Left 06/21/2018   Procedure: 25 GAUGE PARS PLANA VITRECTOMY WITH 20 GAUGE MVR PORT FOR MACULAR HOLE;  Surgeon: Bernarda Caffey, MD;  Location: [facility];  Service: Ophthalmology;  Laterality: Left;   ABDOMINAL HYSTERECTOMY  2000   BREAST SURGERY     saline implants   CARDIOVASCULAR STRESS TEST  09/02/2005   Cardiolite Myocardial perfusion study; NegativeBruce protocol exercise stress test   CATARACT EXTRACTION Left 11/2018   Dr. Midge Aver   EYE SURGERY Left    macular hole repair   GAS/FLUID EXCHANGE Left 06/21/2018   Procedure: GAS/FLUID EXCHANGE;  Surgeon: Bernarda Caffey, MD;  Location: [facility];  Service:  Ophthalmology;  Laterality: Left;  C3F8   HIP PINNING,CANNULATED Right 12/18/2017   Procedure: CANNULATED HIP PINNING;  Surgeon: Hiram Gash, MD;  Location: [facility];  Service: Orthopedics;  Laterality: Right;   LAPAROSCOPIC PARTIAL COLECTOMY Left 05/18/2017   Lung Biospy     MEMBRANE PEEL Left 06/21/2018   Procedure: MEMBRANE PEEL;  Surgeon: Bernarda Caffey, MD;  Location: [facility];  Service: Ophthalmology;  Laterality: Left;   NASAL SINUS SURGERY     PHOTOCOAGULATION WITH LASER Left 06/21/2018   Procedure: PHOTOCOAGULATION WITH LASER;  Surgeon: Bernarda Caffey, MD;  Location: [facility];  Service: Ophthalmology;  Laterality: Left;   TUBAL LIGATION     US ECHOCARDIOGRAPHY  12/26/2011   mild abnormalities    FAMILY HISTORY Family History  Problem Relation Age of  Onset   Hypertension Mother    Cancer - Lung Father 48       deceased   Heart attack Maternal Grandmother    Cancer Maternal Grandfather    Diabetes Paternal Grandfather    Diabetes Brother    Cancer - Colon Sister     SOCIAL HISTORY Social History   Tobacco Use   Smoking status: Former    Pack years: 0.00    Types: Cigarettes    Quit date: 11/04/2002    Years since quitting: 18.1   Smokeless tobacco: Never  Vaping Use   Vaping Use: Never used  Substance Use Topics   Alcohol use: No   Drug use: No         OPHTHALMIC EXAM:  Base Eye Exam     Visual Acuity (Snellen - Linear)       Right Left   Dist cc 20/20- 20/30-   Dist ph cc  NI    Correction: Glasses         Tonometry (Tonopen, 9:20 AM)       Right Left   Pressure 14 12         Pupils       Dark Light Shape React APD   Right 3 2 Round Brisk None   Left 3 2 Round Brisk None         Visual Fields (Counting fingers)       Left Right    Full Full         Extraocular Movement       Right Left    Full Full         Neuro/Psych     Oriented x3: Yes   Mood/Affect: Normal         Dilation     Both eyes: 1.0% Mydriacyl, 2.5%  Phenylephrine @ 9:20 AM           Slit Lamp and Fundus Exam     Slit Lamp Exam       Right Left   Lids/Lashes Dermatochalasis - upper lid Dermatochalasis - upper lid   Conjunctiva/Sclera White and quiet nasal and temporla Pinguecula   Cornea Trace Punctate epithelial erosions, trace Debris in tear film, arcus arcus, 1++inferior Punctate epithelial erosions, Well healed cataract wounds, trace tear film debris, fine endo pigment   Anterior Chamber Deep and quiet Deep and quiet   Iris Round and dilated Round and well dilated   Lens 2-3+ Nuclear sclerosis, 2-3+ Cortical cataract PC IOL in good position with open PC   Vitreous Vitreous syneresis, Posterior vitreous detachment, vitreous condensations post vitrectomy         Fundus Exam       Right Left   Disc Pink and Sharp trace pallor, sharp rim, thin superior rim   C/D Ratio 0.5 0.65   Macula Flat, Blunted foveal reflex, RPE mottling and clumping, prominent focal clump superior to fovea, No heme or edema flat; mac hole closed, blunted foveal reflex, mild persistent trace cystic changes centrally, trace Retinal pigment epithelial mottling, No heme   Vessels attenuated, mild tortuousity, mild AV crossing changes, mild Copper wiring attenuated, mild tortuousity, mild AV crossing changes, mild Copper wiring   Periphery Attached, No RT/RD Attached, good laser 360, No RT/RD           Refraction     Wearing Rx       Sphere Cylinder Axis Add   Right +0.50 +1.25 016 +3.00   Left -0.25 +  0.75 180 +3.00    Type: PAL  Patient updated glasses Rx since last visit.            IMAGING AND PROCEDURES  Imaging and Procedures for _0 @  OCT, Retina - OU - Both Eyes       Right Eye Quality was good. Central Foveal Thickness: 251. Progression has been stable. Findings include normal foveal contour, no SRF, no IRF, outer retinal atrophy (Focal druse/ellipsoid disruption superior to fovea --persistent/slightly improved from prior;  partial PVD).   Left Eye Quality was good. Central Foveal Thickness: 384. Progression has worsened. Findings include no SRF, intraretinal fluid, abnormal foveal contour (Mac hole closed, mild interval increase in IRF/cystic changes).   Notes *Images captured and stored on drive  Diagnosis / Impression:  OD: NFP, No IRF/SRF;Focal ellipsoid disruption superior to fovea --persistent/slightly improved from prior; partial PVD  OS: Mac hole closed, mild interval increase in IRF/cystic changes  Clinical management:  See below  Abbreviations: NFP - Normal foveal profile. CME - cystoid macular edema. PED - pigment epithelial detachment. IRF - intraretinal fluid. SRF - subretinal fluid. EZ - ellipsoid zone. ERM - epiretinal membrane. ORA - outer retinal atrophy. ORT - outer retinal tubulation. SRHM - subretinal hyper-reflective material               ASSESSMENT/PLAN:    ICD-10-CM   1. Macular hole of left eye  H35.342     2. Cystoid macular edema of left eye  H35.352     3. Retinal edema  H35.81 OCT, Retina - OU - Both Eyes    4. Essential hypertension  I10     5. Hypertensive retinopathy of both eyes  H35.033     6. Combined forms of age-related cataract of right eye  H25.811     7. Pseudophakia  Z96.1     8. Dry eyes  H04.123       1-3. Full Thickness Macular Hole OS  - preop BCVA 20/100 w/ central scotoma OS  - s/p PPV/ICG/Membrane peel/C3F8, 01.02.20  - doing well with macular hole closed and ellipsoid signal improved back to baseline essentially  - BCVA: 20/30 -- stable and pt denies any subjective changes in vision OS  - mild interval increase in IRF/cystic changes on OCT, post PF/prolensa taper -- stopped in October 2021  - IOP okay at 12  - discussed findings, no intervention recommended at this time -- recommend monitoring as increased cystic changes on OCT has no impact on BCVA or subjective vision  - F/U 4 months -- CME check -- DFE/OCT -- consider STK?  4,5.  Hypertensive retinopathy OU  - discussed importance of tight BP control  - monitor  6. 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  - discussed diagnosis and progression  - under the expert management of Dr. Shirleen Schirmer  7. Pseudophakia OS  - s/p CE/IOL OS (June 2020, C. Groat)  - beautiful surgery with IOL in excellent position  - tr CME/cystic changes as discussed above  - monitor  8. Dry eyes OU  - recommend artificial tears and lubricating ointment as needed (every 1-2 hours is fine)  - stopped xiidra and Restasis, now just on PF Theratears  Ophthalmic Meds Ordered this visit:  No orders of the defined types were placed in this encounter.     Return in about 4 months (around 04/16/2021) for f/u CME OS,  DFE, OCT.  There are no Patient Instructions on file for this visit.  This document serves as a record of services personally performed by Gardiner Sleeper, MD, PhD. It was created on their behalf by Estill Bakes, COT an ophthalmic technician. The creation of this record is the provider's dictation and/or activities during the visit.    Electronically signed by: Estill Bakes, COT 6.27.22 @ 12:15 PM   This document serves as a record of services personally performed by Gardiner Sleeper, MD, PhD. It was created on their behalf by San Jetty. Owens Shark, OA an ophthalmic technician. The creation of this record is the provider's dictation and/or activities during the visit.    Electronically signed by: San Jetty. Owens Shark, [place] 06.28.2022 12:15 PM   Gardiner Sleeper, M.D., Ph.D. Diseases & Surgery of the Retina and Vitreous [facility]  I have reviewed the above documentation for accuracy and completeness, and I agree with the above. Gardiner Sleeper, M.D., Ph.D. 12/15/20 12:19 PM  Abbreviations: M myopia (nearsighted); A astigmatism; H hyperopia (farsighted); P presbyopia; Mrx  spectacle prescription;  CTL contact lenses; OD right eye; OS left eye; OU both eyes  XT exotropia; ET esotropia; PEK punctate epithelial keratitis; PEE punctate epithelial erosions; DES dry eye syndrome; MGD meibomian gland dysfunction; ATs artificial tears; PFAT's preservative free artificial tears; [place] nuclear sclerotic cataract; PSC posterior subcapsular cataract; ERM epi-retinal membrane; PVD posterior vitreous detachment; RD retinal detachment; DM diabetes mellitus; DR diabetic retinopathy; NPDR non-proliferative diabetic retinopathy; PDR proliferative diabetic retinopathy; CSME clinically significant macular edema; DME diabetic macular edema; dbh dot blot hemorrhages; CWS cotton wool spot; POAG primary open angle glaucoma; C/D cup-to-disc ratio; HVF humphrey visual field; GVF goldmann visual field; OCT optical coherence tomography; IOP intraocular pressure; BRVO Branch retinal vein occlusion; CRVO central retinal vein occlusion; CRAO central retinal artery occlusion; BRAO branch retinal artery occlusion; RT retinal tear; SB scleral buckle; PPV pars plana vitrectomy; VH Vitreous hemorrhage; PRP panretinal laser photocoagulation; IVK intravitreal kenalog; VMT vitreomacular traction; MH Macular hole;  NVD neovascularization of the disc; NVE neovascularization elsewhere; AREDS age related eye disease study; ARMD age related macular degeneration; POAG primary open angle glaucoma; EBMD epithelial/anterior basement membrane dystrophy; ACIOL anterior chamber intraocular lens; IOL intraocular lens; PCIOL posterior chamber intraocular lens; Phaco/IOL phacoemulsification with intraocular lens placement; [place] photorefractive keratectomy; LASIK laser assisted in situ keratomileusis; HTN hypertension; DM diabetes mellitus; COPD chronic obstructive pulmonary disease

## 2020-12-15 ENCOUNTER — Encounter (INDEPENDENT_AMBULATORY_CARE_PROVIDER_SITE_OTHER): Payer: Self-pay | Admitting: Ophthalmology

## 2020-12-15 ENCOUNTER — Other Ambulatory Visit: Payer: Self-pay

## 2020-12-15 ENCOUNTER — Ambulatory Visit (INDEPENDENT_AMBULATORY_CARE_PROVIDER_SITE_OTHER): Payer: Medicare PPO | Admitting: Ophthalmology

## 2020-12-15 DIAGNOSIS — H35342 Macular cyst, hole, or pseudohole, left eye: Secondary | ICD-10-CM | POA: Diagnosis not present

## 2020-12-15 DIAGNOSIS — H25811 Combined forms of age-related cataract, right eye: Secondary | ICD-10-CM

## 2020-12-15 DIAGNOSIS — H35033 Hypertensive retinopathy, bilateral: Secondary | ICD-10-CM | POA: Diagnosis not present

## 2020-12-15 DIAGNOSIS — H35352 Cystoid macular degeneration, left eye: Secondary | ICD-10-CM

## 2020-12-15 DIAGNOSIS — H3581 Retinal edema: Secondary | ICD-10-CM | POA: Diagnosis not present

## 2020-12-15 DIAGNOSIS — I10 Essential (primary) hypertension: Secondary | ICD-10-CM | POA: Diagnosis not present

## 2020-12-15 DIAGNOSIS — Z961 Presence of intraocular lens: Secondary | ICD-10-CM

## 2020-12-15 DIAGNOSIS — H04123 Dry eye syndrome of bilateral lacrimal glands: Secondary | ICD-10-CM

## 2021-04-12 NOTE — Progress Notes (Signed)
[facility] Note  04/16/2021     CHIEF COMPLAINT Patient presents for Retina Follow Up   HISTORY OF PRESENT ILLNESS: Dominique Lee is a 70 y.o. female who presents to the clinic today for:   HPI     Retina Follow Up   Patient presents with  Other.  In left eye.  This started 4 months ago.  I, the attending physician,  performed the HPI with the patient and updated documentation appropriately.        Comments   Patient here for 4 months retina follow up for CME OS. Patient states vision about the same. No eye pain. Gets uncomfortable when gets dry.      Last edited by Bernarda Caffey, MD on 04/18/2021  2:53 AM.     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 Place a 1/2 inch ribbon of ointment into the lower eyelid. (Patient not taking: No sig reported)   bromfenac (XIBROM) 0.09 % ophthalmic solution INSTILL 1 DROP OF SOLUTION INTO LEFT EYE TWICE DAILY (Patient not taking: No sig reported)   Bromfenac Sodium (PROLENSA) 0.07 % SOLN Place 1 drop into the left eye 2 (two) times daily. (Patient not taking: Reported on 12/15/2020)   prednisoLONE acetate (PRED FORTE) 1 % ophthalmic suspension Place 1 drop into the left eye 2 (two) times daily. (Patient not taking: Reported on 12/15/2020)   Propylene Glycol 0.6 % SOLN Place 1 drop into both eyes 3 (three) times daily as needed (dry/irritated eyes.).   RESTASIS 0.05 % ophthalmic emulsion Place 1 drop into both eyes in the morning and at bedtime.  (Patient not taking: No sig reported)   XIIDRA 5 % SOLN Apply 1 drop to eye 2 (two) times daily. (Patient not taking: No sig reported)   No current facility-administered medications for this visit. (Ophthalmic Drugs)   Current Outpatient Medications (Other)  Medication Sig   acetaminophen (TYLENOL) 500 MG tablet Take 1,000 mg by mouth at bedtime.   Cholecalciferol 25 MCG (1000 UT) tablet Take 1,000 Units by mouth daily with lunch.   cyclobenzaprine (FEXMID) 7.5 MG tablet Take 1 tablet (7.5 mg total) by mouth 3 (three) times daily as needed for muscle spasms. (Patient not taking: Reported on 12/15/2020)   enoxaparin (LOVENOX) 40 MG/0.4ML injection Inject 0.4 mLs (40 mg total) into the skin daily.   fluticasone (FLONASE) 50 MCG/ACT nasal spray Place 1 spray into both nostrils daily as needed for allergies or rhinitis.   folic acid (FOLVITE) 1 MG tablet Take 1 mg by mouth daily with lunch.    ibuprofen (ADVIL,MOTRIN) 200 MG tablet Take 1 tablet (200 mg total) by mouth 3 (three) times daily. (Patient not taking: Reported on 12/15/2020)   leflunomide (ARAVA) 10 MG tablet Take 10 mg by mouth daily with lunch.    levothyroxine (SYNTHROID, LEVOTHROID) 75 MCG tablet Take 75 mcg by mouth daily before breakfast.    methotrexate 2.5 MG tablet Take 15 mg by mouth every Thursday. Thursdays with evening meal   metoprolol succinate (TOPROL-XL) 50 MG 24  hr tablet Take 1-1.5 tablets (50-75 mg total) by mouth daily. (Patient taking differently: Take 50 mg by mouth at bedtime.)   omeprazole (PRILOSEC) 20 MG capsule Take 1 capsule (20 mg total) by mouth daily for 14 days. (Patient not taking: Reported on 06/08/2018)   ondansetron (ZOFRAN) 4 MG tablet  (Patient not taking: Reported on 12/15/2020)   polyethylene glycol (MIRALAX / GLYCOLAX) packet Take 17 g by mouth daily as needed for mild constipation. (Patient not taking: Reported on 12/15/2020)   No current facility-administered medications for this visit. (Other)    REVIEW OF SYSTEMS: ROS   Positive for: Musculoskeletal, Endocrine, Cardiovascular, Eyes, Respiratory Negative for: Constitutional, Gastrointestinal, Neurological, Skin, Genitourinary, HENT, Psychiatric, Allergic/Imm, Heme/Lymph Last edited by Theodore Demark, COA on 04/16/2021  9:05 AM.     ALLERGIES Allergies  Allergen Reactions   Codeine Other (See Comments)    Agitation    Morphine And Related Other (See Comments)    agitation    Statins Other (See Comments)    Joint  pain    PAST MEDICAL HISTORY Past Medical History:  Diagnosis Date   Cancer ([place])    Basal cell- skin   Cataract    OD   Complication of anesthesia    COPD (chronic obstructive pulmonary disease) ([facility])    Dysrhythmia    [name]   Family history of adverse reaction to anesthesia    Mother N/V   Fibromyalgia    High cholesterol    Hypertensive retinopathy    OU   Hypothyroidism    Irregular heart beat    Osteoarthritis    Pneumothorax after biopsy    PONV (postoperative nausea and vomiting)    [name] (premature ventricular contraction)    RA (rheumatoid arthritis) ([place])    Thyroid disease    Hypothyroid   Past Surgical History:  Procedure Laterality Date   25 GAUGE PARS PLANA VITRECTOMY WITH 20 GAUGE MVR PORT FOR MACULAR HOLE Left 06/21/2018   Procedure: 25 GAUGE PARS PLANA VITRECTOMY WITH 20 GAUGE MVR PORT FOR MACULAR HOLE;  Surgeon: Bernarda Caffey, MD;  Location: [place];  Service: Ophthalmology;  Laterality: Left;   ABDOMINAL HYSTERECTOMY  2000   BREAST SURGERY     saline implants   CARDIOVASCULAR STRESS TEST  09/02/2005   Cardiolite Myocardial perfusion study; NegativeBruce protocol exercise stress test   CATARACT EXTRACTION Left 11/2018   Dr. Midge Aver   EYE SURGERY Left    macular hole repair   GAS/FLUID EXCHANGE Left 06/21/2018   Procedure: GAS/FLUID EXCHANGE;  Surgeon: Bernarda Caffey, MD;  Location: [place];  Service: Ophthalmology;  Laterality: Left;  C3F8   HIP PINNING,CANNULATED Right  12/18/2017   Procedure: CANNULATED HIP PINNING;  Surgeon: Hiram Gash, MD;  Location: [place];  Service: Orthopedics;  Laterality: Right;   LAPAROSCOPIC PARTIAL COLECTOMY Left 05/18/2017   Lung Biospy     MEMBRANE PEEL Left 06/21/2018   Procedure: MEMBRANE PEEL;  Surgeon: Bernarda Caffey, MD;  Location: [place];  Service: Ophthalmology;  Laterality: Left;   NASAL SINUS SURGERY     PHOTOCOAGULATION WITH LASER Left 06/21/2018   Procedure: PHOTOCOAGULATION WITH LASER;  Surgeon: Bernarda Caffey, MD;  Location: [place];  Service: Ophthalmology;  Laterality: Left;   TUBAL LIGATION     US ECHOCARDIOGRAPHY  12/26/2011   mild abnormalities    FAMILY HISTORY Family History  Problem Relation Age of Onset   Hypertension Mother    Cancer - Lung Father 31  deceased   Heart attack Maternal Grandmother    Cancer Maternal Grandfather    Diabetes Paternal Grandfather    Diabetes Brother    Cancer - Colon Sister     SOCIAL HISTORY Social History   Tobacco Use   Smoking status: Former    Types: Cigarettes    Quit date: 11/04/2002    Years since quitting: 18.4   Smokeless tobacco: Never  Vaping Use   Vaping Use: Never used  Substance Use Topics   Alcohol use: No   Drug use: No         OPHTHALMIC EXAM:  Base Eye Exam     Visual Acuity (Snellen - Linear)       Right Left   Dist cc 20/20 -1 20/30   Dist ph cc  NI    Correction: Glasses  OS looking to the side.        Tonometry (Tonopen, 9:01 AM)       Right Left   Pressure 13 10         Pupils       Dark Light Shape React APD   Right 3 2 Round Brisk None   Left 3 2 Round Brisk None         Visual Fields (Counting fingers)       Left Right    Full          Extraocular Movement       Right Left    Full Full         Neuro/Psych     Oriented x3: Yes   Mood/Affect: Normal         Dilation     Both eyes: 1.0% Mydriacyl, 2.5% Phenylephrine @ 9:01 AM           Slit Lamp and Fundus Exam     Slit  Lamp Exam       Right Left   Lids/Lashes Dermatochalasis - upper lid Dermatochalasis - upper lid   Conjunctiva/Sclera White and quiet nasal and temporla Pinguecula   Cornea Trace Punctate epithelial erosions, trace Debris in tear film, arcus arcus, 1-2+inferior Punctate epithelial erosions, Well healed cataract wounds, trace tear film debris, fine endo pigment   Anterior Chamber Deep and quiet Deep and quiet   Iris Round and dilated Round and well dilated   Lens 2-3+ Nuclear sclerosis, 2-3+ Cortical cataract PC IOL in good position with open PC   Vitreous Vitreous syneresis, Posterior vitreous detachment, vitreous condensations post vitrectomy         Fundus Exam       Right Left   Disc Pink and Sharp trace pallor, sharp rim, thin superior rim, mild PPA superiorly   C/D Ratio 0.5 0.65   Macula Flat, Blunted foveal reflex, RPE mottling and clumping, prominent focal clump superior to fovea - stable, No heme or edema flat; mac hole closed, blunted foveal reflex, trace cystic changes centrally -- improved, trace Retinal pigment epithelial mottling, No heme   Vessels attenuated, mild tortuousity, mild AV crossing changes, mild Copper wiring attenuated, mild tortuousity, mild AV crossing changes, mild Copper wiring   Periphery Attached, No RT/RD, No heme  Attached, good laser 360, No RT/RD           Refraction     Wearing Rx       Sphere Cylinder Axis Add   Right +0.50 +1.25 016 +3.00   Left -0.25 +0.75 180 +3.00    Type: PAL  IMAGING AND PROCEDURES  Imaging and Procedures for _0 @  OCT, Retina - OU - Both Eyes       Right Eye Quality was good. Central Foveal Thickness: 257. Progression has been stable. Findings include normal foveal contour, no SRF, no IRF, outer retinal atrophy (Focal druse/ellipsoid disruption superior to fovea --persistent; partial PVD).   Left Eye Quality was good. Central Foveal Thickness: 368. Progression has improved. Findings  include no SRF, intraretinal fluid, abnormal foveal contour (Mac hole closed, mild interval improvement in cystic changes).   Notes *Images captured and stored on drive  Diagnosis / Impression:  OD: NFP, No IRF/SRF; Focal ellipsoid disruption superior to fovea --persistent; partial PVD  OS: Mac hole closed, mild interval improvement in cystic changes  Clinical management:  See below  Abbreviations: NFP - Normal foveal profile. CME - cystoid macular edema. PED - pigment epithelial detachment. IRF - intraretinal fluid. SRF - subretinal fluid. EZ - ellipsoid zone. ERM - epiretinal membrane. ORA - outer retinal atrophy. ORT - outer retinal tubulation. SRHM - subretinal hyper-reflective material            ASSESSMENT/PLAN:    ICD-10-CM   1. Macular hole of left eye  H35.342     2. Cystoid macular edema of left eye  H35.352     3. Retinal edema  H35.81 OCT, Retina - OU - Both Eyes    4. Essential hypertension  I10     5. Hypertensive retinopathy of both eyes  H35.033     6. Combined forms of age-related cataract of right eye  H25.811     7. Pseudophakia  Z96.1     8. Dry eyes  H04.123      1-3. Full Thickness Macular Hole OS  - preop BCVA 20/100 w/ central scotoma OS  - s/p PPV/ICG/Membrane peel/C3F8, 01.02.20  - doing well with macular hole closed and ellipsoid signal improved back to baseline essentially  - BCVA: 20/30 -- stable and pt denies any subjective changes in vision OS  - mild interval improvement in IRF/cystic changes on OCT, post PF/prolensa taper -- stopped in October 2021  - IOP okay at 10  - no intervention recommended at this time -- recommend monitoring as cystic changes on OCT has no impact on BCVA or subjective vision  - F/U 6 months -- CME check -- DFE/OCT  4,5. Hypertensive retinopathy OU  - discussed importance of tight BP control  - monitor   6. 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  - discussed diagnosis and progression  - under the expert management of Dr. Shirleen Schirmer  7. Pseudophakia OS  - s/p CE/IOL OS (June 2020, C. Groat)  - beautiful surgery with IOL in excellent position  - tr CME/cystic changes as discussed above  - monitor   8. Dry eyes OU  - recommend artificial tears and lubricating ointment as needed (every 1-2 hours is fine)  - stopped xiidra and Restasis, now just on PF Theratears  Ophthalmic Meds Ordered this visit:  No orders of the defined types were placed in this encounter.     Return in about 6 months (around 10/15/2021) for f/u CME OS, DFE, OCT.  There are no Patient Instructions on file for this visit.  This document serves as a record of services personally performed by Gardiner Sleeper, MD, PhD. It was created on their behalf by Leonie Douglas, an  ophthalmic technician. The creation of this record is the provider's dictation and/or activities during the visit.    Electronically signed by: Leonie Douglas COA, 04/18/21  2:56 AM  This document serves as a record of services personally performed by Gardiner Sleeper, MD, PhD. It was created on their behalf by San Jetty. Owens Shark, OA an ophthalmic technician. The creation of this record is the provider's dictation and/or activities during the visit.    Electronically signed by: San Jetty. Owens Shark, [place] 10.28.2022 2:56 AM   Gardiner Sleeper, M.D., Ph.D. Diseases & Surgery of the Retina and [place] 04/16/2021  I have reviewed the above documentation for accuracy and completeness, and I agree with the above. Gardiner Sleeper, M.D., Ph.D. 04/18/21 2:59 AM   Abbreviations: M myopia (nearsighted); A astigmatism; H hyperopia (farsighted); P presbyopia; Mrx spectacle prescription;  CTL contact lenses; OD right eye; OS left eye; OU both eyes  XT exotropia; ET esotropia; PEK punctate epithelial keratitis; PEE punctate epithelial erosions; DES  dry eye syndrome; MGD meibomian gland dysfunction; ATs artificial tears; PFAT's preservative free artificial tears; [place] nuclear sclerotic cataract; PSC posterior subcapsular cataract; ERM epi-retinal membrane; PVD posterior vitreous detachment; RD retinal detachment; DM diabetes mellitus; DR diabetic retinopathy; NPDR non-proliferative diabetic retinopathy; PDR proliferative diabetic retinopathy; CSME clinically significant macular edema; DME diabetic macular edema; dbh dot blot hemorrhages; CWS cotton wool spot; POAG primary open angle glaucoma; C/D cup-to-disc ratio; HVF humphrey visual field; GVF goldmann visual field; OCT optical coherence tomography; IOP intraocular pressure; BRVO Branch retinal vein occlusion; CRVO central retinal vein occlusion; CRAO central retinal artery occlusion; BRAO branch retinal artery occlusion; RT retinal tear; SB scleral buckle; PPV pars plana vitrectomy; VH Vitreous hemorrhage; PRP panretinal laser photocoagulation; IVK intravitreal kenalog; VMT vitreomacular traction; MH Macular hole;  NVD neovascularization of the disc; NVE neovascularization elsewhere; AREDS age related eye disease study; ARMD age related macular degeneration; POAG primary open angle glaucoma; EBMD epithelial/anterior basement membrane dystrophy; ACIOL anterior chamber intraocular lens; IOL intraocular lens; PCIOL posterior chamber intraocular lens; Phaco/IOL phacoemulsification with intraocular lens placement; [place] photorefractive keratectomy; LASIK laser assisted in situ keratomileusis; HTN hypertension; DM diabetes mellitus; COPD chronic obstructive pulmonary disease

## 2021-04-16 ENCOUNTER — Ambulatory Visit (INDEPENDENT_AMBULATORY_CARE_PROVIDER_SITE_OTHER): Payer: Medicare PPO | Admitting: Ophthalmology

## 2021-04-16 ENCOUNTER — Other Ambulatory Visit: Payer: Self-pay

## 2021-04-16 ENCOUNTER — Encounter (INDEPENDENT_AMBULATORY_CARE_PROVIDER_SITE_OTHER): Payer: Self-pay | Admitting: Ophthalmology

## 2021-04-16 DIAGNOSIS — H35342 Macular cyst, hole, or pseudohole, left eye: Secondary | ICD-10-CM | POA: Diagnosis not present

## 2021-04-16 DIAGNOSIS — I10 Essential (primary) hypertension: Secondary | ICD-10-CM

## 2021-04-16 DIAGNOSIS — H3581 Retinal edema: Secondary | ICD-10-CM

## 2021-04-16 DIAGNOSIS — H25811 Combined forms of age-related cataract, right eye: Secondary | ICD-10-CM

## 2021-04-16 DIAGNOSIS — H35033 Hypertensive retinopathy, bilateral: Secondary | ICD-10-CM

## 2021-04-16 DIAGNOSIS — Z961 Presence of intraocular lens: Secondary | ICD-10-CM

## 2021-04-16 DIAGNOSIS — H35352 Cystoid macular degeneration, left eye: Secondary | ICD-10-CM

## 2021-04-16 DIAGNOSIS — H04123 Dry eye syndrome of bilateral lacrimal glands: Secondary | ICD-10-CM

## 2021-04-18 ENCOUNTER — Encounter (INDEPENDENT_AMBULATORY_CARE_PROVIDER_SITE_OTHER): Payer: Self-pay | Admitting: Ophthalmology

## 2021-10-07 NOTE — Progress Notes (Addendum)
?[facility] Note ? ?10/13/2021 ? ?  ? ?CHIEF COMPLAINT ?Patient presents for Retina Follow Up ? ? ? ?HISTORY OF PRESENT ILLNESS: ?Dominique Lee is a 71 y.o. female who presents to the clinic today for:  ? ?HPI   ? ? Retina Follow Up   ?Patient presents with  Other.  Since onset it is stable.  I, the attending physician,  performed the HPI with the patient and updated documentation appropriately. ? ?  ?  ? ? Comments   ?6 month follow up CME OS-  Vision appears stable, no new problems.   ?Around Feb. 2023 Dr. Katy Fitch inserted puncta plugs BLL. ? ?  ?  ?Last edited by Bernarda Caffey, MD on 10/14/2021  1:25 PM.  ?  ?Pt had puncta plugs inserted by Dr. Katy Fitch in February, vision seems stable ? ?Referring physician: ?No referring provider defined for this encounter. ? ?HISTORICAL INFORMATION:  ? 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 ?CURRENT MEDICATIONS: ?Current Outpatient Medications (Ophthalmic Drugs)  ?Medication Sig  ? Propylene Glycol 0.6 % SOLN Place 1 drop into both eyes 3 (three) times daily as needed (dry/irritated eyes.).  ? bacitracin-polymyxin b (POLYSPORIN) ophthalmic ointment Place into the left eye 4 (four) times daily. Place a 1/2 inch ribbon of ointment into the lower eyelid. (Patient not taking: Reported on 04/15/2019)  ? bromfenac (XIBROM) 0.09 % ophthalmic solution INSTILL 1 DROP OF SOLUTION INTO LEFT EYE TWICE DAILY (Patient not taking: No sig reported)  ? Bromfenac Sodium (PROLENSA) 0.07 % SOLN Place 1 drop into the left eye 2 (two) times daily. (Patient not taking: Reported on 12/15/2020)  ? prednisoLONE acetate (PRED FORTE) 1 % ophthalmic suspension Place 1 drop into the left eye 2 (two) times daily. (Patient not taking: Reported on 12/15/2020)  ? RESTASIS 0.05 % ophthalmic emulsion Place 1 drop into both eyes in the morning and at bedtime. (Patient not taking: Reported on 07/21/2020)  ?  XIIDRA 5 % SOLN Apply 1 drop to eye 2 (two) times daily. (Patient not taking: Reported on 12/06/2019)  ? ?No current facility-administered medications for this visit. (Ophthalmic Drugs)  ? ?Current Outpatient Medications (Other)  ?Medication Sig  ? acetaminophen (TYLENOL) 500 MG tablet Take 1,000 mg by mouth at bedtime.  ? Cholecalciferol 25 MCG (1000 UT) tablet Take 1,000 Units by mouth daily with lunch.  ? denosumab (PROLIA) 60 MG/ML SOSY injection Inject 60 mg into the skin every 6 (six) months.  ? fluticasone (FLONASE) 50 MCG/ACT nasal spray Place 1 spray into both nostrils daily as needed for allergies or rhinitis.  ? folic acid (FOLVITE) 1 MG tablet Take 1 mg by mouth daily with lunch.   ? leflunomide (ARAVA) 10 MG tablet Take 10 mg by mouth daily with lunch.   ? levothyroxine (SYNTHROID, LEVOTHROID) 75 MCG tablet Take 75 mcg by mouth daily before breakfast.   ? methotrexate 2.5 MG tablet Take 15 mg by mouth every Thursday. Thursdays with evening meal  ? metoprolol succinate (TOPROL-XL) 50 MG 24 hr tablet Take 1-1.5 tablets (50-75 mg total) by mouth daily. (Patient taking differently: Take 50 mg by mouth at bedtime.)  ? cyclobenzaprine (FEXMID) 7.5 MG tablet Take 1 tablet (7.5 mg total) by mouth 3 (three) times daily as needed for muscle spasms. (Patient not taking: Reported on 12/15/2020)  ? enoxaparin (LOVENOX) 40 MG/0.4ML injection Inject 0.4 mLs (40 mg total)  into the skin daily.  ? ibuprofen (ADVIL,MOTRIN) 200 MG tablet Take 1 tablet (200 mg total) by mouth 3 (three) times daily. (Patient not taking: Reported on 12/15/2020)  ? omeprazole (PRILOSEC) 20 MG capsule Take 1 capsule (20 mg total) by mouth daily for 14 days. (Patient not taking: Reported on 06/08/2018)  ? ondansetron (ZOFRAN) 4 MG tablet  (Patient not taking: Reported on 12/15/2020)  ? polyethylene glycol (MIRALAX / GLYCOLAX) packet Take 17 g by mouth daily as needed for mild constipation. (Patient not taking: Reported on 12/15/2020)  ? ?No current  facility-administered medications for this visit. (Other)  ? ?REVIEW OF SYSTEMS: ?ROS   ?Positive for: Musculoskeletal, Endocrine, Cardiovascular, Eyes, Respiratory ?Negative for: Constitutional, Gastrointestinal, Neurological, Skin, Genitourinary, HENT, Psychiatric, Allergic/Imm, Heme/Lymph ?Last edited by Leonie Douglas, COA on 10/13/2021  8:32 AM.  ?  ? ?ALLERGIES ?Allergies  ?Allergen Reactions  ? Codeine Other (See Comments)  ?  Agitation   ? Morphine And Related Other (See Comments)  ?  agitation   ? Statins Other (See Comments)  ?  Joint  pain  ? ? ?PAST MEDICAL HISTORY ?Past Medical History:  ?Diagnosis Date  ? Cancer [facility])   ? Basal cell- skin  ? Cataract   ? OD  ? Complication of anesthesia   ? COPD (chronic obstructive pulmonary disease) ([place])   ? Dysrhythmia   ? [name]  ? Family history of adverse reaction to anesthesia   ? Mother N/V  ? Fibromyalgia   ? High cholesterol   ? Hypertensive retinopathy   ? OU  ? Hypothyroidism   ? Irregular heart beat   ? Osteoarthritis   ? Pneumothorax after biopsy   ? PONV (postoperative nausea and vomiting)   ? [name] (premature ventricular contraction)   ? RA (rheumatoid arthritis) ([place])   ? Thyroid disease   ? Hypothyroid  ? ?Past Surgical History:  ?Procedure Laterality Date  ? 25 GAUGE PARS PLANA VITRECTOMY WITH 20 GAUGE MVR PORT FOR MACULAR HOLE Left 06/21/2018  ? Procedure: 39 GAUGE PARS PLANA VITRECTOMY WITH 20 GAUGE MVR PORT FOR MACULAR HOLE;  Surgeon: Bernarda Caffey, MD;  Location: [place];  Service: Ophthalmology;  Laterality: Left;  ? ABDOMINAL HYSTERECTOMY  2000  ? BREAST SURGERY    ? saline implants  ? CARDIOVASCULAR STRESS TEST  09/02/2005  ? Cardiolite Myocardial perfusion study; NegativeBruce protocol exercise stress test  ? CATARACT EXTRACTION Left 11/2018  ? Dr. Midge Aver  ? EYE SURGERY Left   ? macular hole repair  ? GAS/FLUID EXCHANGE Left 06/21/2018  ? Procedure: GAS/FLUID EXCHANGE;  Surgeon: Bernarda Caffey, MD;  Location: [place];  Service: Ophthalmology;   Laterality: Left;  C3F8  ? HIP PINNING,CANNULATED Right 12/18/2017  ? Procedure: CANNULATED HIP PINNING;  Surgeon: Hiram Gash, MD;  Location: [place];  Service: Orthopedics;  Laterality: Right;  ? LAPAROSCOPIC PARTIAL COLECTOMY Left 05/18/2017  ? Lung Biospy    ? MEMBRANE PEEL Left 06/21/2018  ? Procedure: MEMBRANE PEEL;  Surgeon: Bernarda Caffey, MD;  Location: [place];  Service: Ophthalmology;  Laterality: Left;  ? NASAL SINUS SURGERY    ? PHOTOCOAGULATION WITH LASER Left 06/21/2018  ? Procedure: PHOTOCOAGULATION WITH LASER;  Surgeon: Bernarda Caffey, MD;  Location: [place];  Service: Ophthalmology;  Laterality: Left;  ? TUBAL LIGATION    ? US ECHOCARDIOGRAPHY  12/26/2011  ? mild abnormalities  ? ?FAMILY HISTORY ?Family History  ?Problem Relation Age of Onset  ? Hypertension Mother   ? Cancer - Lung  Father 31  ?     deceased  ? Heart attack Maternal Grandmother   ? Cancer Maternal Grandfather   ? Diabetes Paternal Grandfather   ? Diabetes Brother   ? Cancer - Colon Sister   ? ?SOCIAL HISTORY ?Social History  ? ?Tobacco Use  ? Smoking status: Former  ?  Types: Cigarettes  ?  Quit date: 11/04/2002  ?  Years since quitting: 18.9  ? Smokeless tobacco: Never  ?Vaping Use  ? Vaping Use: Never used  ?Substance Use Topics  ? Alcohol use: No  ? Drug use: No  ?  ? ?  ?OPHTHALMIC EXAM: ? ?Base Eye Exam   ? ? Visual Acuity (Snellen - Linear)   ? ?   Right Left  ? Dist cc 20/25 20/40  ? Dist ph cc 20/20- NI  ? ? Correction: Glasses  ? ?  ?  ? ? Tonometry (Tonopen, 8:41 AM)   ? ?   Right Left  ? Pressure 15 12  ? ?  ?  ? ? Pupils   ? ?   Dark Light Shape React APD  ? Right 3 2 Round Brisk None  ? Left 3 2 Round Brisk None  ? ?  ?  ? ? Visual Fields (Counting fingers)   ? ?   Left Right  ?  Full Full  ? ?  ?  ? ? Extraocular Movement   ? ?   Right Left  ?  Full Full  ? ?  ?  ? ? Neuro/Psych   ? ? Oriented x3: Yes  ? Mood/Affect: Normal  ? ?  ?  ? ? Dilation   ? ? Both eyes: 1.0% Mydriacyl, 2.5% Phenylephrine @ 8:41 AM  ? ?  ?  ? ?  ? ?Slit Lamp  and Fundus Exam   ? ? Slit Lamp Exam   ? ?   Right Left  ? Lids/Lashes Dermatochalasis - upper lid, lower punctal plug in place Dermatochalasis - upper lid, lower punctal plug in place  ? Conjunctiva/Sclera Wh

## 2021-10-13 ENCOUNTER — Ambulatory Visit (INDEPENDENT_AMBULATORY_CARE_PROVIDER_SITE_OTHER): Payer: Medicare PPO | Admitting: Ophthalmology

## 2021-10-13 ENCOUNTER — Encounter (INDEPENDENT_AMBULATORY_CARE_PROVIDER_SITE_OTHER): Payer: Self-pay | Admitting: Ophthalmology

## 2021-10-13 DIAGNOSIS — I10 Essential (primary) hypertension: Secondary | ICD-10-CM | POA: Diagnosis not present

## 2021-10-13 DIAGNOSIS — H35352 Cystoid macular degeneration, left eye: Secondary | ICD-10-CM

## 2021-10-13 DIAGNOSIS — H35033 Hypertensive retinopathy, bilateral: Secondary | ICD-10-CM | POA: Diagnosis not present

## 2021-10-13 DIAGNOSIS — H04123 Dry eye syndrome of bilateral lacrimal glands: Secondary | ICD-10-CM

## 2021-10-13 DIAGNOSIS — H35342 Macular cyst, hole, or pseudohole, left eye: Secondary | ICD-10-CM | POA: Diagnosis not present

## 2021-10-13 DIAGNOSIS — Z961 Presence of intraocular lens: Secondary | ICD-10-CM

## 2021-10-13 DIAGNOSIS — H25811 Combined forms of age-related cataract, right eye: Secondary | ICD-10-CM

## 2021-10-14 ENCOUNTER — Encounter (INDEPENDENT_AMBULATORY_CARE_PROVIDER_SITE_OTHER): Payer: Self-pay | Admitting: Ophthalmology

## 2021-10-15 ENCOUNTER — Encounter (INDEPENDENT_AMBULATORY_CARE_PROVIDER_SITE_OTHER): Payer: Medicare PPO | Admitting: Ophthalmology

## 2022-01-12 ENCOUNTER — Encounter (INDEPENDENT_AMBULATORY_CARE_PROVIDER_SITE_OTHER): Payer: Medicare PPO | Admitting: Ophthalmology

## 2022-01-12 DIAGNOSIS — I10 Essential (primary) hypertension: Secondary | ICD-10-CM

## 2022-01-12 DIAGNOSIS — H35033 Hypertensive retinopathy, bilateral: Secondary | ICD-10-CM

## 2022-01-12 DIAGNOSIS — H25811 Combined forms of age-related cataract, right eye: Secondary | ICD-10-CM

## 2022-01-12 DIAGNOSIS — H35352 Cystoid macular degeneration, left eye: Secondary | ICD-10-CM

## 2022-01-12 DIAGNOSIS — H35342 Macular cyst, hole, or pseudohole, left eye: Secondary | ICD-10-CM

## 2022-01-12 DIAGNOSIS — H04123 Dry eye syndrome of bilateral lacrimal glands: Secondary | ICD-10-CM

## 2022-01-12 DIAGNOSIS — Z961 Presence of intraocular lens: Secondary | ICD-10-CM

## 2022-01-20 NOTE — Progress Notes (Signed)
[facility] Note  01/26/2022     CHIEF COMPLAINT Patient presents for Retina Follow Up  HISTORY OF PRESENT ILLNESS: Dominique Lee is a 71 y.o. female who presents to the clinic today for:   HPI     Retina Follow Up   Patient presents with  Other.  This started years ago.  Duration of 3 months.  Since onset it is stable.  I, the attending physician,  performed the HPI with the patient and updated documentation appropriately.        Comments   Patient denies noticing any vision changes at this time. She is using the eye drops as instructed.       Last edited by Bernarda Caffey, MD on 01/26/2022 12:04 PM.      Referring physician: No referring provider defined for this encounter.  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 Place a 1/2 inch ribbon of ointment into the lower eyelid. (Patient not taking: Reported on 04/15/2019)   bromfenac (XIBROM) 0.09 % ophthalmic solution INSTILL 1 DROP OF SOLUTION INTO LEFT EYE TWICE DAILY (Patient not taking: No sig reported)   Bromfenac Sodium (PROLENSA) 0.07 % SOLN Place 1 drop into the left eye 2 (two) times daily. (Patient not taking: Reported on 12/15/2020)   prednisoLONE acetate (PRED FORTE) 1 % ophthalmic suspension Place 1 drop into the left eye 2 (two) times daily. (Patient not taking: Reported on 12/15/2020)   Propylene Glycol 0.6 % SOLN Place 1 drop into both eyes 3 (three) times daily as needed (dry/irritated eyes.).   RESTASIS 0.05 % ophthalmic emulsion Place 1 drop into both eyes in the morning and at bedtime. (Patient not taking: Reported on 07/21/2020)   XIIDRA 5 % SOLN Apply 1 drop to eye 2 (two) times daily.  (Patient not taking: Reported on 12/06/2019)   No current facility-administered medications for this visit. (Ophthalmic Drugs)   Current Outpatient Medications (Other)  Medication Sig   acetaminophen (TYLENOL) 500 MG tablet Take 1,000 mg by mouth at bedtime.   Cholecalciferol 25 MCG (1000 UT) tablet Take 1,000 Units by mouth daily with lunch.   cyclobenzaprine (FEXMID) 7.5 MG tablet Take 1 tablet (7.5 mg total) by mouth 3 (three) times daily as needed for muscle spasms. (Patient not taking: Reported on 12/15/2020)   denosumab (PROLIA) 60 MG/ML SOSY injection Inject 60 mg into the skin every 6 (six) months.   enoxaparin (LOVENOX) 40 MG/0.4ML injection Inject 0.4 mLs (40 mg total) into the skin daily.   fluticasone (FLONASE) 50 MCG/ACT nasal spray Place 1 spray into both nostrils daily as needed for allergies or rhinitis.   folic acid (FOLVITE) 1 MG tablet Take 1 mg by mouth daily with lunch.    ibuprofen (ADVIL,MOTRIN) 200 MG tablet Take 1 tablet (200 mg total) by mouth 3 (three) times daily. (Patient not taking: Reported on 12/15/2020)   leflunomide (ARAVA) 10 MG tablet Take 10 mg by mouth daily with lunch.    levothyroxine (SYNTHROID, LEVOTHROID) 75 MCG tablet Take 75 mcg by mouth daily before breakfast.    methotrexate 2.5 MG tablet Take 15 mg by mouth every Thursday. Thursdays with evening meal   metoprolol succinate (TOPROL-XL) 50 MG 24 hr tablet Take  1-1.5 tablets (50-75 mg total) by mouth daily. (Patient taking differently: Take 50 mg by mouth at bedtime.)   omeprazole (PRILOSEC) 20 MG capsule Take 1 capsule (20 mg total) by mouth daily for 14 days. (Patient not taking: Reported on 06/08/2018)   ondansetron (ZOFRAN) 4 MG tablet  (Patient not taking: Reported on 12/15/2020)   polyethylene glycol (MIRALAX / GLYCOLAX) packet Take 17 g by mouth daily as needed for mild constipation. (Patient not taking: Reported on 12/15/2020)   No current facility-administered medications for this visit. (Other)    REVIEW OF SYSTEMS: ROS   Positive for: Musculoskeletal, Endocrine, Cardiovascular, Eyes, Respiratory Negative for: Constitutional, Gastrointestinal, Neurological, Skin, Genitourinary, HENT, Psychiatric, Allergic/Imm, Heme/Lymph Last edited by Annie Paras, COT on 01/26/2022  8:52 AM.     ALLERGIES Allergies  Allergen Reactions   Codeine Other (See Comments)    Agitation    Morphine And Related Other (See Comments)    agitation    Statins Other (See Comments)    Joint  pain   PAST MEDICAL HISTORY Past Medical History:  Diagnosis Date   Cancer ([place])    Basal cell- skin   Cataract    OD   Complication of anesthesia    COPD (chronic obstructive pulmonary disease) ([facility])    Dysrhythmia    [name]   Family history of adverse reaction to anesthesia    Mother N/V   Fibromyalgia    High cholesterol    Hypertensive retinopathy    OU   Hypothyroidism    Irregular heart beat    Osteoarthritis    Pneumothorax after biopsy    PONV (postoperative nausea and vomiting)    [name] (premature ventricular contraction)    RA (rheumatoid arthritis) ([place])    Thyroid disease    Hypothyroid   Past Surgical History:  Procedure Laterality Date   25 GAUGE PARS PLANA VITRECTOMY WITH 20 GAUGE MVR PORT FOR MACULAR HOLE Left 06/21/2018   Procedure: 25 GAUGE PARS PLANA VITRECTOMY WITH 20 GAUGE MVR PORT FOR MACULAR HOLE;  Surgeon: Bernarda Caffey, MD;  Location: [facility];  Service: Ophthalmology;  Laterality: Left;   ABDOMINAL HYSTERECTOMY  2000   BREAST SURGERY     saline implants   CARDIOVASCULAR STRESS TEST  09/02/2005   Cardiolite Myocardial perfusion study; NegativeBruce protocol exercise stress test   CATARACT EXTRACTION Left 11/2018   Dr. Midge Aver   EYE SURGERY Left    macular hole repair   GAS/FLUID EXCHANGE Left 06/21/2018   Procedure: GAS/FLUID EXCHANGE;  Surgeon: Bernarda Caffey, MD;  Location: [facility];  Service: Ophthalmology;  Laterality: Left;  C3F8   HIP PINNING,CANNULATED Right  12/18/2017   Procedure: CANNULATED HIP PINNING;  Surgeon: Hiram Gash, MD;  Location: [facility];  Service: Orthopedics;  Laterality: Right;   LAPAROSCOPIC PARTIAL COLECTOMY Left 05/18/2017   Lung Biospy     MEMBRANE PEEL Left 06/21/2018   Procedure: MEMBRANE PEEL;  Surgeon: Bernarda Caffey, MD;  Location: [facility];  Service: Ophthalmology;  Laterality: Left;   NASAL SINUS SURGERY     PHOTOCOAGULATION WITH LASER Left 06/21/2018   Procedure: PHOTOCOAGULATION WITH LASER;  Surgeon: Bernarda Caffey, MD;  Location: [facility];  Service: Ophthalmology;  Laterality: Left;   TUBAL LIGATION     US ECHOCARDIOGRAPHY  12/26/2011   mild abnormalities   FAMILY HISTORY Family History  Problem Relation Age of Onset   Hypertension Mother    Cancer - Lung Father 22       deceased  Heart attack Maternal Grandmother    Cancer Maternal Grandfather    Diabetes Paternal Grandfather    Diabetes Brother    Cancer - Colon Sister    SOCIAL HISTORY Social History   Tobacco Use   Smoking status: Former    Types: Cigarettes    Quit date: 11/04/2002    Years since quitting: 19.2   Smokeless tobacco: Never  Vaping Use   Vaping Use: Never used  Substance Use Topics   Alcohol use: No   Drug use: No       OPHTHALMIC EXAM:  Base Eye Exam     Visual Acuity (Snellen - Linear)       Right Left   Dist cc 20/20 20/40   Dist ph cc  NI    Correction: Glasses         Tonometry (Tonopen, 8:55 AM)       Right Left   Pressure 15 13         Pupils       Dark Light Shape React APD   Right 3 2 Round Brisk None   Left 3 2 Round Brisk None         Visual Fields       Left Right    Full Full         Extraocular Movement       Right Left    Full, Ortho Full, Ortho         Neuro/Psych     Oriented x3: Yes   Mood/Affect: Normal         Dilation     Both eyes: 1.0% Mydriacyl, 2.5% Phenylephrine @ 8:52 AM           Slit Lamp and Fundus Exam     Slit Lamp Exam       Right Left    Lids/Lashes Dermatochalasis - upper lid, lower punctal plug in place Dermatochalasis - upper lid, lower punctal plug in place   Conjunctiva/Sclera White and quiet nasal and temporla Pinguecula   Cornea 1+Punctate epithelial erosions, trace Debris in tear film, arcus, fine endo pigment arcus, 2+inferior Punctate epithelial erosions, Well healed cataract wounds, trace tear film debris, fine endo pigment   Anterior Chamber Deep and quiet Deep and quiet   Iris Round and dilated Round and well dilated   Lens 2-3+ Nuclear sclerosis, 2-3+ Cortical cataract PC IOL in good position with open PC   Anterior Vitreous Vitreous syneresis, Posterior vitreous detachment, vitreous condensations post vitrectomy         Fundus Exam       Right Left   Disc Pink and Sharp trace pallor, sharp rim, thin superior rim, mild PPA superiorly   C/D Ratio 0.5 0.65   Macula Flat, good foveal reflex, RPE mottling and clumping, prominent focal clump superior to fovea - stable, No heme or edema flat; mac hole closed, blunted foveal reflex, trace cystic changes centrally, trace Retinal pigment epithelial mottling, ?early vitelliform lesion centrally, No heme   Vessels mild attenuation, mild tortuosity, mild copper wiring attenuated, Tortuous   Periphery Attached, mild reticular degeneration, no RT/RD, No heme Attached, mild reticular degeneration, good laser 360, No RT/RD           Refraction     Wearing Rx       Sphere Cylinder Axis Add   Right +0.50 +1.25 016 +3.00   Left -0.25 +0.75 180 +3.00    Type: PAL  IMAGING AND PROCEDURES  Imaging and Procedures for _0 @  OCT, Retina - OU - Both Eyes       Right Eye Quality was good. Central Foveal Thickness: 249. Progression has been stable. Findings include normal foveal contour, no IRF, no SRF, outer retinal atrophy (Focal druse/ellipsoid disruption superior to fovea -- persistent; partial PVD).   Left Eye Quality was good. Central Foveal  Thickness: 378. Progression has improved. Findings include abnormal foveal contour, intraretinal fluid, subretinal fluid (Mac hole closed, persistent cystic changes, focal pocket of SRF slightly improved).   Notes *Images captured and stored on drive  Diagnosis / Impression:  OD: NFP, No IRF/SRF; Focal ellipsoid disruption superior to fovea --persistent; partial PVD  OS: Mac hole closed, persistent cystic changes, focal pocket of SRF slightly improved  Clinical management:  See below  Abbreviations: NFP - Normal foveal profile. CME - cystoid macular edema. PED - pigment epithelial detachment. IRF - intraretinal fluid. SRF - subretinal fluid. EZ - ellipsoid zone. ERM - epiretinal membrane. ORA - outer retinal atrophy. ORT - outer retinal tubulation. SRHM - subretinal hyper-reflective material            ASSESSMENT/PLAN:    ICD-10-CM   1. Macular hole of left eye  H35.342 OCT, Retina - OU - Both Eyes    2. Cystoid macular edema of left eye  H35.352     3. Essential hypertension  I10     4. Hypertensive retinopathy of both eyes  H35.033     5. Combined forms of age-related cataract of right eye  H25.811     6. Pseudophakia  Z96.1     7. Dry eyes  H04.123      1-3. Full Thickness Macular Hole OS  - preop BCVA 20/100 w/ central scotoma OS  - s/p PPV/ICG/Membrane peel/C3F8, 01.02.20  - doing well with macular hole closed and ellipsoid signal improved back to baseline essentially  - BCVA: 20/40   - tr IRF/cystic changes on OCT, post PF/prolensa taper -- stopped in October 2021  - OCT 4.26.23 shows interval development of small vitelliform like lesion / focal SRF OS  - today OCT shows Mac hole closed, persistent cystic changes, focal pocket of SRF slightly improved  - IOP okay at 13  - no intervention recommended at this time -- recommend monitoring  - F/U 4-6 months -- DFE/OCT -- f/u vitelliform like lesion  4,5. Hypertensive retinopathy OU  - discussed importance of tight  BP control  - monitor   6. 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  - discussed diagnosis and progression  - under the expert management of Dr. Shirleen Schirmer  7. Pseudophakia OS  - s/p CE/IOL OS (June 2020, C. Groat)  - beautiful surgery with IOL in excellent position  - tr CME/cystic changes as discussed above  - monitor   8. Dry eyes OU  - recommend artificial tears and lubricating ointment as needed (every 1-2 hours is fine)  - stopped xiidra and Restasis, now just on PF Theratears  Ophthalmic Meds Ordered this visit:  No orders of the defined types were placed in this encounter.    Return for f/u 4-6 months, FTMH OS, DFE, OCT.  There are no Patient Instructions on file for this visit.  This document serves as a record of services personally performed by Gardiner Sleeper, MD, PhD. It was created on their behalf by [facility]  Moshe Cipro, an ophthalmic technician. The creation of this record is the provider's dictation and/or activities during the visit.    Electronically signed by: Orvan Falconer, OA, 01/26/22  12:04 PM  This document serves as a record of services personally performed by Gardiner Sleeper, MD, PhD. It was created on their behalf by San Jetty. Owens Shark, OA an ophthalmic technician. The creation of this record is the provider's dictation and/or activities during the visit.    Electronically signed by: San Jetty. Owens Shark, [place] 08.09.2023 12:04 PM   Gardiner Sleeper, M.D., Ph.D. Diseases & Surgery of the Retina and Vitreous [facility]  I have reviewed the above documentation for accuracy and completeness, and I agree with the above. Gardiner Sleeper, M.D., Ph.D. 01/26/22 12:06 PM   Abbreviations: M myopia (nearsighted); A astigmatism; H hyperopia (farsighted); P presbyopia; Mrx spectacle prescription;  CTL contact lenses; OD right eye; OS left eye; OU both eyes   XT exotropia; ET esotropia; PEK punctate epithelial keratitis; PEE punctate epithelial erosions; DES dry eye syndrome; MGD meibomian gland dysfunction; ATs artificial tears; PFAT's preservative free artificial tears; [place] nuclear sclerotic cataract; PSC posterior subcapsular cataract; ERM epi-retinal membrane; PVD posterior vitreous detachment; RD retinal detachment; DM diabetes mellitus; DR diabetic retinopathy; NPDR non-proliferative diabetic retinopathy; PDR proliferative diabetic retinopathy; CSME clinically significant macular edema; DME diabetic macular edema; dbh dot blot hemorrhages; CWS cotton wool spot; POAG primary open angle glaucoma; C/D cup-to-disc ratio; HVF humphrey visual field; GVF goldmann visual field; OCT optical coherence tomography; IOP intraocular pressure; BRVO Branch retinal vein occlusion; CRVO central retinal vein occlusion; CRAO central retinal artery occlusion; BRAO branch retinal artery occlusion; RT retinal tear; SB scleral buckle; PPV pars plana vitrectomy; VH Vitreous hemorrhage; PRP panretinal laser photocoagulation; IVK intravitreal kenalog; VMT vitreomacular traction; MH Macular hole;  NVD neovascularization of the disc; NVE neovascularization elsewhere; AREDS age related eye disease study; ARMD age related macular degeneration; POAG primary open angle glaucoma; EBMD epithelial/anterior basement membrane dystrophy; ACIOL anterior chamber intraocular lens; IOL intraocular lens; PCIOL posterior chamber intraocular lens; Phaco/IOL phacoemulsification with intraocular lens placement; [place] photorefractive keratectomy; LASIK laser assisted in situ keratomileusis; HTN hypertension; DM diabetes mellitus; COPD chronic obstructive pulmonary disease

## 2022-01-26 ENCOUNTER — Encounter (INDEPENDENT_AMBULATORY_CARE_PROVIDER_SITE_OTHER): Payer: Self-pay | Admitting: Ophthalmology

## 2022-01-26 ENCOUNTER — Ambulatory Visit (INDEPENDENT_AMBULATORY_CARE_PROVIDER_SITE_OTHER): Payer: Medicare PPO | Admitting: Ophthalmology

## 2022-01-26 DIAGNOSIS — H35342 Macular cyst, hole, or pseudohole, left eye: Secondary | ICD-10-CM

## 2022-01-26 DIAGNOSIS — Z961 Presence of intraocular lens: Secondary | ICD-10-CM

## 2022-01-26 DIAGNOSIS — H25811 Combined forms of age-related cataract, right eye: Secondary | ICD-10-CM

## 2022-01-26 DIAGNOSIS — I10 Essential (primary) hypertension: Secondary | ICD-10-CM | POA: Diagnosis not present

## 2022-01-26 DIAGNOSIS — H35033 Hypertensive retinopathy, bilateral: Secondary | ICD-10-CM

## 2022-01-26 DIAGNOSIS — H35352 Cystoid macular degeneration, left eye: Secondary | ICD-10-CM | POA: Diagnosis not present

## 2022-01-26 DIAGNOSIS — H04123 Dry eye syndrome of bilateral lacrimal glands: Secondary | ICD-10-CM

## 2022-06-21 NOTE — Progress Notes (Signed)
[facility] Note  06/29/2022     CHIEF COMPLAINT Patient presents for Retina Follow Up  HISTORY OF PRESENT ILLNESS: Dominique Lee is a 72 y.o. female who presents to the clinic today for:   HPI     Retina Follow Up   Patient presents with  Other.  In left eye.  Severity is moderate.  Duration of 5 months.  Since onset it is stable.  I, the attending physician,  performed the HPI with the patient and updated documentation appropriately.        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 Pt states VA seems the same but has gone back to wearing old rx. She had a new rx and around November she wasn't seeing as well w/ them so she switched back.       Last edited by Bernarda Caffey, MD on 06/29/2022  8:51 AM.    Pt states vision is the same, she cannot see with her left eye  Referring physician: No referring provider defined for this encounter.  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   bacitracin-polymyxin b (POLYSPORIN) ophthalmic ointment Place into the left eye 4 (four) times daily. Place a 1/2 inch ribbon of ointment into the lower eyelid. (Patient not taking: Reported on 04/15/2019)   bromfenac (XIBROM) 0.09 % ophthalmic solution INSTILL 1 DROP OF SOLUTION INTO LEFT EYE TWICE DAILY (Patient not taking: No sig reported)   Bromfenac Sodium (PROLENSA) 0.07 % SOLN Place 1 drop into the left eye 2 (two) times daily. (Patient not taking: Reported on 12/15/2020)   prednisoLONE acetate (PRED FORTE) 1 % ophthalmic suspension Place 1 drop into the left eye 2 (two) times daily. (Patient not taking: Reported on 12/15/2020)   RESTASIS 0.05 %  ophthalmic emulsion Place 1 drop into both eyes in the morning and at bedtime. (Patient not taking: Reported on 07/21/2020)   XIIDRA 5 % SOLN Apply 1 drop to eye 2 (two) times daily. (Patient not taking: Reported on 12/06/2019)   No current facility-administered medications for this visit. (Ophthalmic Drugs)   Current Outpatient Medications (Other)  Medication Sig   acetaminophen (TYLENOL) 500 MG tablet Take 1,000 mg by mouth at bedtime.   Cholecalciferol 25 MCG (1000 UT) tablet Take 1,000 Units by mouth daily with lunch.   fluticasone (FLONASE) 50 MCG/ACT nasal spray Place 1 spray into both nostrils daily as needed for allergies or rhinitis.   folic acid (FOLVITE) 1 MG tablet Take 1 mg by mouth daily with lunch.    leflunomide (ARAVA) 10 MG tablet Take 10 mg by mouth daily with lunch.    levothyroxine (SYNTHROID, LEVOTHROID) 75 MCG tablet Take 75 mcg by mouth daily before breakfast.    methotrexate 2.5 MG tablet Take 15 mg by mouth every Thursday. Thursdays with evening meal   metoprolol succinate (TOPROL-XL) 50 MG 24 hr tablet Take 1-1.5 tablets (50-75 mg total) by mouth daily. (Patient taking differently: Take 50 mg by mouth at bedtime.)   cyclobenzaprine (FEXMID) 7.5 MG tablet Take 1 tablet (7.5 mg total) by mouth 3 (three) times daily as needed for muscle spasms. (Patient not taking: Reported on 12/15/2020)  denosumab (PROLIA) 60 MG/ML SOSY injection Inject 60 mg into the skin every 6 (six) months.   enoxaparin (LOVENOX) 40 MG/0.4ML injection Inject 0.4 mLs (40 mg total) into the skin daily.   ibuprofen (ADVIL,MOTRIN) 200 MG tablet Take 1 tablet (200 mg total) by mouth 3 (three) times daily. (Patient not taking: Reported on 12/15/2020)   omeprazole (PRILOSEC) 20 MG capsule Take 1 capsule (20 mg total) by mouth daily for 14 days. (Patient not taking: Reported on 06/08/2018)   ondansetron (ZOFRAN) 4 MG tablet  (Patient not taking: Reported on 12/15/2020)   polyethylene glycol (MIRALAX / GLYCOLAX)  packet Take 17 g by mouth daily as needed for mild constipation. (Patient not taking: Reported on 12/15/2020)   No current facility-administered medications for this visit. (Other)   REVIEW OF SYSTEMS: ROS   Positive for: Musculoskeletal, Endocrine, Cardiovascular, Eyes, Respiratory Negative for: Constitutional, Gastrointestinal, Neurological, Skin, Genitourinary, HENT, Psychiatric, Allergic/Imm, Heme/Lymph Last edited by Kingsley Spittle, COT on 06/29/2022  8:07 AM.     ALLERGIES Allergies  Allergen Reactions   Codeine Other (See Comments)    Agitation    Morphine And Related Other (See Comments)    agitation    Statins Other (See Comments)    Joint  pain   PAST MEDICAL HISTORY Past Medical History:  Diagnosis Date   Cancer ([place])    Basal cell- skin   Cataract    OD   Complication of anesthesia    COPD (chronic obstructive pulmonary disease) (H[facility])    Dysrhythmia    [name]   Family history of adverse reaction to anesthesia    Mother N/V   Fibromyalgia    High cholesterol    Hypertensive retinopathy    OU   Hypothyroidism    Irregular heart beat    Osteoarthritis    Pneumothorax after biopsy    PONV (postoperative nausea and vomiting)    [name] (premature ventricular contraction)    RA (rheumatoid arthritis) ([place])    Thyroid disease    Hypothyroid   Past Surgical History:  Procedure Laterality Date   25 GAUGE PARS PLANA VITRECTOMY WITH 20 GAUGE MVR PORT FOR MACULAR HOLE Left 06/21/2018   Procedure: 25 GAUGE PARS PLANA VITRECTOMY WITH 20 GAUGE MVR PORT FOR MACULAR HOLE;  Surgeon: Bernarda Caffey, MD;  Location: [place];  Service: Ophthalmology;  Laterality: Left;   ABDOMINAL HYSTERECTOMY  2000   BREAST SURGERY     saline implants   CARDIOVASCULAR STRESS TEST  09/02/2005   Cardiolite Myocardial perfusion study; NegativeBruce protocol exercise stress test   CATARACT EXTRACTION Left 11/2018   Dr. Midge Aver   EYE SURGERY Left    macular hole repair   GAS/FLUID EXCHANGE  Left 06/21/2018   Procedure: GAS/FLUID EXCHANGE;  Surgeon: Bernarda Caffey, MD;  Location: [place];  Service: Ophthalmology;  Laterality: Left;  C3F8   HIP PINNING,CANNULATED Right 12/18/2017   Procedure: CANNULATED HIP PINNING;  Surgeon: Hiram Gash, MD;  Location: [place];  Service: Orthopedics;  Laterality: Right;   LAPAROSCOPIC PARTIAL COLECTOMY Left 05/18/2017   Lung Biospy     MEMBRANE PEEL Left 06/21/2018   Procedure: MEMBRANE PEEL;  Surgeon: Bernarda Caffey, MD;  Location: [place];  Service: Ophthalmology;  Laterality: Left;   NASAL SINUS SURGERY     PHOTOCOAGULATION WITH LASER Left 06/21/2018   Procedure: PHOTOCOAGULATION WITH LASER;  Surgeon: Bernarda Caffey, MD;  Location: [place];  Service: Ophthalmology;  Laterality: Left;   TUBAL LIGATION     US  ECHOCARDIOGRAPHY  12/26/2011   mild abnormalities   FAMILY HISTORY Family History  Problem Relation Age of Onset   Hypertension Mother    Cancer - Lung Father 15       deceased   Heart attack Maternal Grandmother    Cancer Maternal Grandfather    Diabetes Paternal Grandfather    Diabetes Brother    Cancer - Colon Sister    SOCIAL HISTORY Social History   Tobacco Use   Smoking status: Former    Types: Cigarettes    Quit date: 11/04/2002    Years since quitting: 19.6   Smokeless tobacco: Never  Vaping Use   Vaping Use: Never used  Substance Use Topics   Alcohol use: No   Drug use: No       OPHTHALMIC EXAM:  Base Eye Exam     Visual Acuity (Snellen - Linear)       Right Left   Dist cc 20/25 -1 20/40   Dist ph cc NI     Correction: Glasses         Tonometry (Tonopen, 8:17 AM)       Right Left   Pressure 9 10         Pupils       Pupils Dark Light Shape React APD   Right PERRL 3 2 Round Brisk None   Left PERRL 3 2 Round Brisk None         Visual Fields (Counting fingers)       Left Right    Full Full         Extraocular Movement       Right Left    Full, Ortho Full, Ortho         Neuro/Psych      Oriented x3: Yes   Mood/Affect: Normal         Dilation     Both eyes: 1.0% Mydriacyl, 2.5% Phenylephrine @ 8:18 AM           Slit Lamp and Fundus Exam     Slit Lamp Exam       Right Left   Lids/Lashes Dermatochalasis - upper lid, lower punctal plug in place Dermatochalasis - upper lid, lower punctal plug in place   Conjunctiva/Sclera White and quiet nasal and temporla Pinguecula   Cornea arcus arcus, 2-3+fine Punctate epithelial erosions, Well healed cataract wounds   Anterior Chamber Deep and quiet Deep and quiet   Iris Round and dilated Round and well dilated   Lens 2-3+ Nuclear sclerosis, 2-3+ Cortical cataract PC IOL in good position with open PC   Anterior Vitreous Vitreous syneresis, Posterior vitreous detachment, vitreous condensations post vitrectomy         Fundus Exam       Right Left   Disc Pink and Sharp trace pallor, sharp rim, thin superior rim, mild PPA superiorly   C/D Ratio 0.6 0.65   Macula Flat, good foveal reflex, RPE mottling and clumping, prominent focal clump superior to fovea - stable, No heme or edema flat; mac hole closed, blunted foveal reflex, trace cystic changes centrally -- improved, trace Retinal pigment epithelial mottling, ?early vitelliform lesion centrally, No heme   Vessels attenuated, mild tortuosity, mild AV crossing changes, mild copper wiring attenuated, mild tortuosity, mild AV crossing changes, mild copper wiring   Periphery Attached, mild reticular degeneration, no RT/RD, No heme Attached, mild reticular degeneration, good laser 360, No RT/RD           Refraction  Wearing Rx       Sphere Cylinder Axis Add   Right +0.75 +1.25 028 +2.25   Left -0.25 +1.00 028 +2.25  Old rx specs          IMAGING AND PROCEDURES  Imaging and Procedures for _0 @  OCT, Retina - OU - Both Eyes       Right Eye Quality was good. Central Foveal Thickness: 250. Progression has been stable. Findings include normal foveal contour,  no IRF, no SRF, outer retinal atrophy (Focal druse/ellipsoid disruption superior to fovea -- persistent; partial PVD).   Left Eye Quality was good. Central Foveal Thickness: 357. Progression has improved. Findings include abnormal foveal contour, intraretinal fluid, subretinal fluid (Mac hole closed, interval improvement in cystic changes / IRF, focal pocket of SRF -- persistent).   Notes *Images captured and stored on drive  Diagnosis / Impression:  OD: NFP, No IRF/SRF; Focal ellipsoid disruption superior to fovea --persistent; partial PVD  OS: Mac hole closed, interval improvement in cystic changes / IRF, focal pocket of SRF -- persistent  Clinical management:  See below  Abbreviations: NFP - Normal foveal profile. CME - cystoid macular edema. PED - pigment epithelial detachment. IRF - intraretinal fluid. SRF - subretinal fluid. EZ - ellipsoid zone. ERM - epiretinal membrane. ORA - outer retinal atrophy. ORT - outer retinal tubulation. SRHM - subretinal hyper-reflective material            ASSESSMENT/PLAN:    ICD-10-CM   1. Macular hole of left eye  H35.342 OCT, Retina - OU - Both Eyes    2. Cystoid macular edema of left eye  H35.352     3. Essential hypertension  I10     4. Hypertensive retinopathy of both eyes  H35.033     5. Combined forms of age-related cataract of right eye  H25.811     6. Pseudophakia  Z96.1      1-3. Full Thickness Macular Hole OS  - preop BCVA 20/100 w/ central scotoma OS  - s/p PPV/ICG/Membrane peel/C3F8, 01.02.20  - doing well with macular hole closed and ellipsoid signal improved back to baseline essentially  - BCVA: 20/40   - tr IRF/cystic changes on OCT, post PF/prolensa taper -- stopped in October 2021  - OCT 4.26.23 shows interval development of small vitelliform like lesion / focal SRF OS  - today OCT shows Mac hole closed, interval improvement in cystic changes / IRF, focal pocket of SRF -- persistent  - IOP okay at 10  - no  intervention recommended at this time -- recommend monitoring  - F/U 4-6 months -- DFE/OCT -- f/u vitelliform like lesion  4,5. Hypertensive retinopathy OU  - discussed importance of tight BP control  - monitor   6. 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  - discussed diagnosis and progression  - under the expert management of Dr. Shirleen Schirmer  7. Pseudophakia OS  - s/p CE/IOL OS (June 2020, C. Groat)  - beautiful surgery with IOL in excellent position  - tr CME/cystic changes as discussed above  - monitor   8. Dry eyes OU  - recommend artificial tears and lubricating ointment as needed (every 1-2 hours is fine)  - stopped xiidra and Restasis, now just on PF Theratears  Ophthalmic Meds Ordered this visit:  No orders of the defined types were placed in this encounter.    Return for f/u  4-6 months, FTMH OS, DFE, OCT.  There are no Patient Instructions on file for this visit.  This document serves as a record of services personally performed by Gardiner Sleeper, MD, PhD. It was created on their behalf by Orvan Falconer, an ophthalmic technician. The creation of this record is the provider's dictation and/or activities during the visit.    Electronically signed by: Orvan Falconer, OA, 06/29/22  4:46 PM  This document serves as a record of services personally performed by Gardiner Sleeper, MD, PhD. It was created on their behalf by San Jetty. Owens Shark, OA an ophthalmic technician. The creation of this record is the provider's dictation and/or activities during the visit.    Electronically signed by: San Jetty. Owens Shark, [place] 01.10.2024 4:46 PM  Gardiner Sleeper, M.D., Ph.D. Diseases & Surgery of the Retina and Vitreous [facility]  I have reviewed the above documentation for accuracy and completeness, and I agree with the above. Gardiner Sleeper, M.D., Ph.D. 06/29/22 4:47  PM   Abbreviations: M myopia (nearsighted); A astigmatism; H hyperopia (farsighted); P presbyopia; Mrx spectacle prescription;  CTL contact lenses; OD right eye; OS left eye; OU both eyes  XT exotropia; ET esotropia; PEK punctate epithelial keratitis; PEE punctate epithelial erosions; DES dry eye syndrome; MGD meibomian gland dysfunction; ATs artificial tears; PFAT's preservative free artificial tears; [place] nuclear sclerotic cataract; PSC posterior subcapsular cataract; ERM epi-retinal membrane; PVD posterior vitreous detachment; RD retinal detachment; DM diabetes mellitus; DR diabetic retinopathy; NPDR non-proliferative diabetic retinopathy; PDR proliferative diabetic retinopathy; CSME clinically significant macular edema; DME diabetic macular edema; dbh dot blot hemorrhages; CWS cotton wool spot; POAG primary open angle glaucoma; C/D cup-to-disc ratio; HVF humphrey visual field; GVF goldmann visual field; OCT optical coherence tomography; IOP intraocular pressure; BRVO Branch retinal vein occlusion; CRVO central retinal vein occlusion; CRAO central retinal artery occlusion; BRAO branch retinal artery occlusion; RT retinal tear; SB scleral buckle; PPV pars plana vitrectomy; VH Vitreous hemorrhage; PRP panretinal laser photocoagulation; IVK intravitreal kenalog; VMT vitreomacular traction; MH Macular hole;  NVD neovascularization of the disc; NVE neovascularization elsewhere; AREDS age related eye disease study; ARMD age related macular degeneration; POAG primary open angle glaucoma; EBMD epithelial/anterior basement membrane dystrophy; ACIOL anterior chamber intraocular lens; IOL intraocular lens; PCIOL posterior chamber intraocular lens; Phaco/IOL phacoemulsification with intraocular lens placement; [place] photorefractive keratectomy; LASIK laser assisted in situ keratomileusis; HTN hypertension; DM diabetes mellitus; COPD chronic obstructive pulmonary disease

## 2022-06-29 ENCOUNTER — Encounter (INDEPENDENT_AMBULATORY_CARE_PROVIDER_SITE_OTHER): Payer: Self-pay | Admitting: Ophthalmology

## 2022-06-29 ENCOUNTER — Ambulatory Visit (INDEPENDENT_AMBULATORY_CARE_PROVIDER_SITE_OTHER): Payer: Medicare PPO | Admitting: Ophthalmology

## 2022-06-29 DIAGNOSIS — H35352 Cystoid macular degeneration, left eye: Secondary | ICD-10-CM | POA: Diagnosis not present

## 2022-06-29 DIAGNOSIS — H35342 Macular cyst, hole, or pseudohole, left eye: Secondary | ICD-10-CM | POA: Diagnosis not present

## 2022-06-29 DIAGNOSIS — I10 Essential (primary) hypertension: Secondary | ICD-10-CM | POA: Diagnosis not present

## 2022-06-29 DIAGNOSIS — H35033 Hypertensive retinopathy, bilateral: Secondary | ICD-10-CM

## 2022-06-29 DIAGNOSIS — H25811 Combined forms of age-related cataract, right eye: Secondary | ICD-10-CM

## 2022-06-29 DIAGNOSIS — Z961 Presence of intraocular lens: Secondary | ICD-10-CM

## 2022-11-29 NOTE — Progress Notes (Signed)
[facility] - Clinic Note  12/07/2022     CHIEF COMPLAINT Patient presents for Retina Follow Up  HISTORY OF PRESENT ILLNESS: Dominique Lee is a 72 y.o. female who presents to the clinic today for:   HPI     Retina Follow Up   Patient presents with  Other.  In left eye.  This started years ago.  Severity is moderate.  Duration of 5 months.  Since onset it is stable.  I, the attending physician,  performed the HPI with the patient and updated documentation appropriately.        Comments   Patient feels that the vision is the same. The center vision is still gone in the left eye. She is AT's OU PRN.      Last edited by Rennis Chris, MD on 12/07/2022  8:51 PM.    Pt states vision is the same, she has been on Tyrvana for awhile, so she is anxious to see about her dry eyes  Referring physician: No referring provider defined for this encounter.  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   bacitracin-polymyxin b (POLYSPORIN) ophthalmic ointment Place into the left eye 4 (four) times daily. Place a 1/2 inch ribbon of ointment into the lower eyelid.   bromfenac (XIBROM) 0.09 % ophthalmic solution INSTILL 1 DROP OF SOLUTION INTO LEFT EYE TWICE DAILY (Patient not taking: No sig reported)   Bromfenac Sodium (PROLENSA) 0.07 % SOLN Place 1 drop into the left eye 2 (two) times daily.   prednisoLONE acetate (PRED FORTE) 1 % ophthalmic suspension Place 1 drop into the left eye 2 (two) times daily.   RESTASIS 0.05 % ophthalmic emulsion Place 1 drop into both eyes in the morning and at bedtime.   XIIDRA 5 % SOLN Apply 1 drop to eye 2 (two) times daily.   No current  facility-administered medications for this visit. (Ophthalmic Drugs)   Current Outpatient Medications (Other)  Medication Sig   acetaminophen (TYLENOL) 500 MG tablet Take 1,000 mg by mouth at bedtime.   Cholecalciferol 25 MCG (1000 UT) tablet Take 1,000 Units by mouth daily with lunch.   cyclobenzaprine (FEXMID) 7.5 MG tablet Take 1 tablet (7.5 mg total) by mouth 3 (three) times daily as needed for muscle spasms.   fluticasone (FLONASE) 50 MCG/ACT nasal spray Place 1 spray into both nostrils daily as needed for allergies or rhinitis.   folic acid (FOLVITE) 1 MG tablet Take 1 mg by mouth daily with lunch.    ibuprofen (ADVIL,MOTRIN) 200 MG tablet Take 1 tablet (200 mg total) by mouth 3 (three) times daily.   leflunomide (ARAVA) 10 MG tablet Take 10 mg by mouth daily with lunch.    levothyroxine (SYNTHROID, LEVOTHROID) 75 MCG tablet Take 75 mcg by mouth daily before breakfast.    methotrexate 2.5 MG tablet Take 15 mg by mouth every Thursday. Thursdays with evening meal   metoprolol succinate (TOPROL-XL) 50 MG 24 hr tablet Take 1-1.5 tablets (50-75 mg total) by mouth daily. (Patient taking differently: Take 50 mg by mouth at bedtime.)   denosumab (PROLIA) 60 MG/ML SOSY injection Inject 60 mg into the skin every 6 (six) months.   enoxaparin (LOVENOX) 40  MG/0.4ML injection Inject 0.4 mLs (40 mg total) into the skin daily.   omeprazole (PRILOSEC) 20 MG capsule Take 1 capsule (20 mg total) by mouth daily for 14 days. (Patient not taking: Reported on 06/08/2018)   ondansetron (ZOFRAN) 4 MG tablet  (Patient not taking: Reported on 12/15/2020)   polyethylene glycol (MIRALAX / GLYCOLAX) packet Take 17 g by mouth daily as needed for mild constipation. (Patient not taking: Reported on 12/15/2020)   No current facility-administered medications for this visit. (Other)   REVIEW OF SYSTEMS: ROS   Positive for: Musculoskeletal, Endocrine, Cardiovascular, Eyes, Respiratory Negative for: Constitutional,  Gastrointestinal, Neurological, Skin, Genitourinary, HENT, Psychiatric, Allergic/Imm, Heme/Lymph Last edited by Charlette Caffey, COT on 12/07/2022  8:29 AM.     ALLERGIES Allergies  Allergen Reactions   Codeine Other (See Comments)    Agitation    Morphine And Codeine Other (See Comments)    agitation    Statins Other (See Comments)    Joint  pain   PAST MEDICAL HISTORY Past Medical History:  Diagnosis Date   Cancer (HCC)    Basal cell- skin   Cataract    OD   Complication of anesthesia    COPD (chronic obstructive pulmonary disease) (HCC)    Dysrhythmia    [name]   Family history of adverse reaction to anesthesia    Mother N/V   Fibromyalgia    High cholesterol    Hypertensive retinopathy    OU   Hypothyroidism    Irregular heart beat    Osteoarthritis    Pneumothorax after biopsy    PONV (postoperative nausea and vomiting)    [name] (premature ventricular contraction)    RA (rheumatoid arthritis) (HCC)    Thyroid disease    Hypothyroid   Past Surgical History:  Procedure Laterality Date   25 GAUGE PARS PLANA VITRECTOMY WITH 20 GAUGE MVR PORT FOR MACULAR HOLE Left 06/21/2018   Procedure: 25 GAUGE PARS PLANA VITRECTOMY WITH 20 GAUGE MVR PORT FOR MACULAR HOLE;  Surgeon: Rennis Chris, MD;  Location: [facility] OR;  Service: Ophthalmology;  Laterality: Left;   ABDOMINAL HYSTERECTOMY  2000   BREAST SURGERY     saline implants   CARDIOVASCULAR STRESS TEST  09/02/2005   Cardiolite Myocardial perfusion study; NegativeBruce protocol exercise stress test   CATARACT EXTRACTION Left 11/2018   Dr. Marchelle Gearing   EYE SURGERY Left    macular hole repair   GAS/FLUID EXCHANGE Left 06/21/2018   Procedure: GAS/FLUID EXCHANGE;  Surgeon: Rennis Chris, MD;  Location: [facility] OR;  Service: Ophthalmology;  Laterality: Left;  C3F8   HIP PINNING,CANNULATED Right 12/18/2017   Procedure: CANNULATED HIP PINNING;  Surgeon: Bjorn Pippin, MD;  Location: [facility] OR;  Service: Orthopedics;  Laterality: Right;    LAPAROSCOPIC PARTIAL COLECTOMY Left 05/18/2017   Lung Biospy     MEMBRANE PEEL Left 06/21/2018   Procedure: MEMBRANE PEEL;  Surgeon: Rennis Chris, MD;  Location: [facility] OR;  Service: Ophthalmology;  Laterality: Left;   NASAL SINUS SURGERY     PHOTOCOAGULATION WITH LASER Left 06/21/2018   Procedure: PHOTOCOAGULATION WITH LASER;  Surgeon: Rennis Chris, MD;  Location: [facility] OR;  Service: Ophthalmology;  Laterality: Left;   TUBAL LIGATION     US ECHOCARDIOGRAPHY  12/26/2011   mild abnormalities   FAMILY HISTORY Family History  Problem Relation Age of Onset   Hypertension Mother    Cancer - Lung Father 41       deceased   Heart attack Maternal Grandmother  Cancer Maternal Grandfather    Diabetes Paternal Grandfather    Diabetes Brother    Cancer - Colon Sister    SOCIAL HISTORY Social History   Tobacco Use   Smoking status: Former    Types: Cigarettes    Quit date: 11/04/2002    Years since quitting: 20.1   Smokeless tobacco: Never  Vaping Use   Vaping Use: Never used  Substance Use Topics   Alcohol use: No   Drug use: No       OPHTHALMIC EXAM:  Base Eye Exam     Visual Acuity (Snellen - Linear)       Right Left   Dist cc 20/25 20/40 -1   Dist ph cc NI NI    Correction: Glasses         Tonometry (Tonopen, 8:34 AM)       Right Left   Pressure 8 9         Pupils       Dark Light Shape React APD   Right 3 2 Round Brisk None   Left 3 2 Round Brisk None         Visual Fields       Left Right    Full Full         Extraocular Movement       Right Left    Full, Ortho Full, Ortho         Neuro/Psych     Oriented x3: Yes   Mood/Affect: Normal         Dilation     Both eyes: 1.0% Mydriacyl, 2.5% Phenylephrine @ 8:31 AM           Slit Lamp and Fundus Exam     Slit Lamp Exam       Right Left   Lids/Lashes Dermatochalasis - upper lid, lower punctal plug in place Dermatochalasis - upper lid, lower punctal plug in place    Conjunctiva/Sclera White and quiet nasal and temporla Pinguecula   Cornea arcus, mild tear film debris arcus, trace Punctate epithelial erosions, Well healed cataract wounds, fine endo pigment   Anterior Chamber Deep and quiet Deep and quiet   Iris Round and dilated Round and well dilated   Lens 2-3+ Nuclear sclerosis, 2-3+ Cortical cataract PC IOL in good position with open PC   Anterior Vitreous Vitreous syneresis, Posterior vitreous detachment, vitreous condensations post vitrectomy         Fundus Exam       Right Left   Disc Pink and Sharp trace pallor, sharp rim, thin superior rim, mild PPA superiorly   C/D Ratio 0.6 0.65   Macula Flat, good foveal reflex, RPE mottling and clumping, prominent focal clump superior to fovea - stable, No heme or edema flat; mac hole closed, blunted foveal reflex, trace cystic changes centrally, trace Retinal pigment epithelial mottling, No heme   Vessels attenuated, mild tortuosity, mild AV crossing changes, mild copper wiring attenuated, mild tortuosity, mild AV crossing changes, mild copper wiring   Periphery Attached, mild reticular degeneration, no RT/RD, No heme Attached, mild reticular degeneration, good laser 360, No RT/RD           Refraction     Wearing Rx       Sphere Cylinder Axis Add   Right +0.75 +1.25 028 +2.25   Left -0.25 +1.00 028 +2.25           IMAGING AND PROCEDURES  Imaging and Procedures for @TODAY @  OCT,  Retina - OU - Both Eyes       Right Eye Quality was good. Central Foveal Thickness: 252. Progression has been stable. Findings include normal foveal contour, no IRF, no SRF, outer retinal atrophy (Focal druse/ellipsoid disruption superior to fovea -- stable; partial PVD).   Left Eye Quality was good. Central Foveal Thickness: 358. Progression has improved. Findings include abnormal foveal contour, intraretinal fluid, subretinal fluid (Mac hole closed, persistent cystic changes / IRF, focal pocket of SRF --  improved).   Notes *Images captured and stored on drive  Diagnosis / Impression:  OD: NFP, No IRF/SRF; Focal ellipsoid disruption superior to fovea --persistent; partial PVD  OS: Mac hole closed, persistent cystic changes / IRF, focal pocket of SRF -- improved  Clinical management:  See below  Abbreviations: NFP - Normal foveal profile. CME - cystoid macular edema. PED - pigment epithelial detachment. IRF - intraretinal fluid. SRF - subretinal fluid. EZ - ellipsoid zone. ERM - epiretinal membrane. ORA - outer retinal atrophy. ORT - outer retinal tubulation. SRHM - subretinal hyper-reflective material            ASSESSMENT/PLAN:    ICD-10-CM   1. Macular hole of left eye  H35.342 OCT, Retina - OU - Both Eyes    2. Cystoid macular edema of left eye  H35.352     3. Essential hypertension  I10     4. Hypertensive retinopathy of both eyes  H35.033     5. Combined forms of age-related cataract of right eye  H25.811     6. Pseudophakia  Z96.1     7. Dry eyes  H04.123      1-3. Full Thickness Macular Hole OS  - preop BCVA 20/100 w/ central scotoma OS  - s/p PPV/ICG/Membrane peel/C3F8, 01.02.20  - doing well with macular hole closed and ellipsoid signal improved back to baseline essentially  - BCVA: 20/40 --stable  - tr IRF/cystic changes on OCT, post PF/prolensa taper -- stopped in October 2021  - OCT 4.26.23 shows interval development of small vitelliform like lesion / focal SRF OS  - today OCT shows Mac hole closed, persistent cystic changes / IRF, focal pocket of SRF -- improved  - IOP okay at 09  - no intervention recommended at this time -- monitor  - F/U 6 months -- DFE/OCT  4,5. Hypertensive retinopathy OU  - discussed importance of tight BP control  - monitor   6. 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  - discussed diagnosis and progression  - under the expert  management of Dr. Zetta Bills  7. Pseudophakia OS  - s/p CE/IOL OS (June 2020, C. Groat)  - beautiful surgery with IOL in excellent position  - tr CME/cystic changes as discussed above  - monitor   8. Dry eyes OU  - recommend artificial tears and lubricating ointment as needed (every 1-2 hours is fine)  - using Refresh PF and Tryvana  Ophthalmic Meds Ordered this visit:  No orders of the defined types were placed in this encounter.    Return in about 6 months (around 06/08/2023) for f/u FTMH OS, DFE, OCT.  There are no Patient Instructions on file for this visit.  This document serves as a record of services personally performed by Karie Chimera, MD, PhD. It was created on their behalf by De Blanch, an ophthalmic technician. The creation of this record is the  provider's dictation and/or activities during the visit.    Electronically signed by: De Blanch, OA, 12/07/22  8:56 PM  This document serves as a record of services personally performed by Karie Chimera, MD, PhD. It was created on their behalf by Glee Arvin. Manson Passey, OA an ophthalmic technician. The creation of this record is the provider's dictation and/or activities during the visit.    Electronically signed by: Glee Arvin. Kristopher Oppenheim 06.19.2024 8:56 PM  Karie Chimera, M.D., Ph.D. Diseases & Surgery of the Retina and Vitreous [facility]  I have reviewed the above documentation for accuracy and completeness, and I agree with the above. Karie Chimera, M.D., Ph.D. 12/07/22 8:59 PM   Abbreviations: M myopia (nearsighted); A astigmatism; H hyperopia (farsighted); P presbyopia; Mrx spectacle prescription;  CTL contact lenses; OD right eye; OS left eye; OU both eyes  XT exotropia; ET esotropia; PEK punctate epithelial keratitis; PEE punctate epithelial erosions; DES dry eye syndrome; MGD meibomian gland dysfunction; ATs artificial tears; PFAT's preservative free artificial tears; NSC nuclear  sclerotic cataract; PSC posterior subcapsular cataract; ERM epi-retinal membrane; PVD posterior vitreous detachment; RD retinal detachment; DM diabetes mellitus; DR diabetic retinopathy; NPDR non-proliferative diabetic retinopathy; PDR proliferative diabetic retinopathy; CSME clinically significant macular edema; DME diabetic macular edema; dbh dot blot hemorrhages; CWS cotton wool spot; POAG primary open angle glaucoma; C/D cup-to-disc ratio; HVF humphrey visual field; GVF goldmann visual field; OCT optical coherence tomography; IOP intraocular pressure; BRVO Branch retinal vein occlusion; CRVO central retinal vein occlusion; CRAO central retinal artery occlusion; BRAO branch retinal artery occlusion; RT retinal tear; SB scleral buckle; PPV pars plana vitrectomy; VH Vitreous hemorrhage; PRP panretinal laser photocoagulation; IVK intravitreal kenalog; VMT vitreomacular traction; MH Macular hole;  NVD neovascularization of the disc; NVE neovascularization elsewhere; AREDS age related eye disease study; ARMD age related macular degeneration; POAG primary open angle glaucoma; EBMD epithelial/anterior basement membrane dystrophy; ACIOL anterior chamber intraocular lens; IOL intraocular lens; PCIOL posterior chamber intraocular lens; Phaco/IOL phacoemulsification with intraocular lens placement; PRK photorefractive keratectomy; LASIK laser assisted in situ keratomileusis; HTN hypertension; DM diabetes mellitus; COPD chronic obstructive pulmonary disease

## 2022-12-07 ENCOUNTER — Ambulatory Visit (INDEPENDENT_AMBULATORY_CARE_PROVIDER_SITE_OTHER): Payer: Medicare PPO | Admitting: Ophthalmology

## 2022-12-07 ENCOUNTER — Encounter (INDEPENDENT_AMBULATORY_CARE_PROVIDER_SITE_OTHER): Payer: Self-pay | Admitting: Ophthalmology

## 2022-12-07 DIAGNOSIS — H35033 Hypertensive retinopathy, bilateral: Secondary | ICD-10-CM

## 2022-12-07 DIAGNOSIS — I10 Essential (primary) hypertension: Secondary | ICD-10-CM

## 2022-12-07 DIAGNOSIS — H35342 Macular cyst, hole, or pseudohole, left eye: Secondary | ICD-10-CM | POA: Diagnosis not present

## 2022-12-07 DIAGNOSIS — H25811 Combined forms of age-related cataract, right eye: Secondary | ICD-10-CM

## 2022-12-07 DIAGNOSIS — H35352 Cystoid macular degeneration, left eye: Secondary | ICD-10-CM

## 2022-12-07 DIAGNOSIS — Z961 Presence of intraocular lens: Secondary | ICD-10-CM

## 2022-12-07 DIAGNOSIS — H04123 Dry eye syndrome of bilateral lacrimal glands: Secondary | ICD-10-CM

## 2023-06-01 NOTE — Progress Notes (Signed)
[facility] - Clinic Note  06/07/2023     CHIEF COMPLAINT Patient presents for Retina Follow Up  HISTORY OF PRESENT ILLNESS: Dominique Lee is a 72 y.o. female who presents to the clinic today for:   HPI     Retina Follow Up   Patient presents with  Other.  In left eye.  Severity is moderate.  Duration of 6 months.  Since onset it is stable.  I, the attending physician,  performed the HPI with the patient and updated documentation appropriately.        Comments   Pt here for 6 mo ret f/u mac hole OS. Pt states VA the same, no changes. Pt is still using Tryvana for dry eye OU. Uses Refresh PRN.       Last edited by Rennis Chris, MD on 06/09/2023 12:10 AM.    Pt states vision is the same  Referring physician: No referring provider defined for this encounter.  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   bacitracin-polymyxin b (POLYSPORIN) ophthalmic ointment Place into the left eye 4 (four) times daily. Place a 1/2 inch ribbon of ointment into the lower eyelid.   bromfenac (XIBROM) 0.09 % ophthalmic solution INSTILL 1 DROP OF SOLUTION INTO LEFT EYE TWICE DAILY (Patient not taking: No sig reported)   Bromfenac Sodium (PROLENSA) 0.07 % SOLN Place 1 drop into the left eye 2 (two) times daily.   prednisoLONE acetate (PRED FORTE) 1 % ophthalmic suspension Place 1 drop into the left eye 2 (two) times daily.   RESTASIS 0.05 % ophthalmic emulsion Place 1 drop into both eyes in the morning and at bedtime.   XIIDRA 5 % SOLN Apply 1 drop to eye 2 (two) times daily.   No current facility-administered medications for this visit. (Ophthalmic Drugs)   Current  Outpatient Medications (Other)  Medication Sig   acetaminophen (TYLENOL) 500 MG tablet Take 1,000 mg by mouth at bedtime.   Cholecalciferol 25 MCG (1000 UT) tablet Take 1,000 Units by mouth daily with lunch.   cyclobenzaprine (FEXMID) 7.5 MG tablet Take 1 tablet (7.5 mg total) by mouth 3 (three) times daily as needed for muscle spasms.   fluticasone (FLONASE) 50 MCG/ACT nasal spray Place 1 spray into both nostrils daily as needed for allergies or rhinitis.   folic acid (FOLVITE) 1 MG tablet Take 1 mg by mouth daily with lunch.    ibuprofen (ADVIL,MOTRIN) 200 MG tablet Take 1 tablet (200 mg total) by mouth 3 (three) times daily.   leflunomide (ARAVA) 10 MG tablet Take 10 mg by mouth daily with lunch.    levothyroxine (SYNTHROID, LEVOTHROID) 75 MCG tablet Take 75 mcg by mouth daily before breakfast.    methotrexate 2.5 MG tablet Take 15 mg by mouth every Thursday. Thursdays with evening meal   metoprolol succinate (TOPROL-XL) 50 MG 24 hr tablet Take 1-1.5 tablets (50-75 mg total) by mouth daily. (Patient taking differently: Take 50 mg by mouth at bedtime.)   denosumab (PROLIA) 60 MG/ML SOSY injection Inject 60 mg into the skin every 6 (six) months.   enoxaparin (LOVENOX) 40 MG/0.4ML injection Inject 0.4 mLs (40 mg total) into the skin daily.   omeprazole (PRILOSEC)  20 MG capsule Take 1 capsule (20 mg total) by mouth daily for 14 days. (Patient not taking: Reported on 06/08/2018)   ondansetron (ZOFRAN) 4 MG tablet  (Patient not taking: Reported on 12/15/2020)   polyethylene glycol (MIRALAX / GLYCOLAX) packet Take 17 g by mouth daily as needed for mild constipation. (Patient not taking: Reported on 12/15/2020)   No current facility-administered medications for this visit. (Other)   REVIEW OF SYSTEMS: ROS   Positive for: Musculoskeletal, Endocrine, Cardiovascular, Eyes, Respiratory Negative for: Constitutional, Gastrointestinal, Neurological, Skin, Genitourinary, HENT, Psychiatric, Allergic/Imm,  Heme/Lymph Last edited by Thompson Grayer, COT on 06/07/2023  8:41 AM.      ALLERGIES Allergies  Allergen Reactions   Codeine Other (See Comments)    Agitation    Morphine And Codeine Other (See Comments)    agitation    Statins Other (See Comments)    Joint  pain   PAST MEDICAL HISTORY Past Medical History:  Diagnosis Date   Cancer (HCC)    Basal cell- skin   Cataract    OD   Complication of anesthesia    COPD (chronic obstructive pulmonary disease) (HCC)    Dysrhythmia    [name]   Family history of adverse reaction to anesthesia    Mother N/V   Fibromyalgia    High cholesterol    Hypertensive retinopathy    OU   Hypothyroidism    Irregular heart beat    Osteoarthritis    Pneumothorax after biopsy    PONV (postoperative nausea and vomiting)    [name] (premature ventricular contraction)    RA (rheumatoid arthritis) (HCC)    Thyroid disease    Hypothyroid   Past Surgical History:  Procedure Laterality Date   25 GAUGE PARS PLANA VITRECTOMY WITH 20 GAUGE MVR PORT FOR MACULAR HOLE Left 06/21/2018   Procedure: 25 GAUGE PARS PLANA VITRECTOMY WITH 20 GAUGE MVR PORT FOR MACULAR HOLE;  Surgeon: Rennis Chris, MD;  Location: [facility] OR;  Service: Ophthalmology;  Laterality: Left;   ABDOMINAL HYSTERECTOMY  2000   BREAST SURGERY     saline implants   CARDIOVASCULAR STRESS TEST  09/02/2005   Cardiolite Myocardial perfusion study; NegativeBruce protocol exercise stress test   CATARACT EXTRACTION Left 11/2018   Dr. Marchelle Gearing   EYE SURGERY Left    macular hole repair   GAS/FLUID EXCHANGE Left 06/21/2018   Procedure: GAS/FLUID EXCHANGE;  Surgeon: Rennis Chris, MD;  Location: [facility] OR;  Service: Ophthalmology;  Laterality: Left;  C3F8   HIP PINNING,CANNULATED Right 12/18/2017   Procedure: CANNULATED HIP PINNING;  Surgeon: Bjorn Pippin, MD;  Location: [facility] OR;  Service: Orthopedics;  Laterality: Right;   LAPAROSCOPIC PARTIAL COLECTOMY Left 05/18/2017   Lung Biospy     MEMBRANE PEEL Left  06/21/2018   Procedure: MEMBRANE PEEL;  Surgeon: Rennis Chris, MD;  Location: [facility] OR;  Service: Ophthalmology;  Laterality: Left;   NASAL SINUS SURGERY     PHOTOCOAGULATION WITH LASER Left 06/21/2018   Procedure: PHOTOCOAGULATION WITH LASER;  Surgeon: Rennis Chris, MD;  Location: [facility] OR;  Service: Ophthalmology;  Laterality: Left;   TUBAL LIGATION     US ECHOCARDIOGRAPHY  12/26/2011   mild abnormalities   FAMILY HISTORY Family History  Problem Relation Age of Onset   Hypertension Mother    Cancer - Lung Father 83       deceased   Heart attack Maternal Grandmother    Cancer Maternal Grandfather    Diabetes Paternal Grandfather    Diabetes Brother  Cancer - Colon Sister    SOCIAL HISTORY Social History   Tobacco Use   Smoking status: Former    Current packs/day: 0.00    Types: Cigarettes    Quit date: 11/04/2002    Years since quitting: 20.6   Smokeless tobacco: Never  Vaping Use   Vaping status: Never Used  Substance Use Topics   Alcohol use: No   Drug use: No       OPHTHALMIC EXAM:  Base Eye Exam     Visual Acuity (Snellen - Linear)       Right Left   Dist cc 20/25 +2 20/40 -2   Dist ph cc  NI    Correction: Glasses         Tonometry (Tonopen, 8:48 AM)       Right Left   Pressure 14 12         Pupils       Pupils Dark Light Shape React APD   Right PERRL 3 2 Round Brisk None   Left PERRL 3 2 Round Brisk None         Visual Fields (Counting fingers)       Left Right    Full Full         Extraocular Movement       Right Left    Full, Ortho Full, Ortho         Neuro/Psych     Oriented x3: Yes   Mood/Affect: Normal         Dilation     Both eyes: 1.0% Mydriacyl, 2.5% Phenylephrine @ 8:49 AM           Slit Lamp and Fundus Exam     Slit Lamp Exam       Right Left   Lids/Lashes Dermatochalasis - upper lid, lower punctal plug in place Dermatochalasis - upper lid, lower punctal plug in place, mild MGD   Conjunctiva/Sclera  White and quiet nasal and temporla Pinguecula   Cornea arcus, mild tear film debris arcus, 2+ fine Punctate epithelial erosions, Well healed cataract wounds, fine endo pigment   Anterior Chamber deep and clear deep and clear   Iris Round and dilated Round and well dilated   Lens 2-3+ Nuclear sclerosis, 2-3+ Cortical cataract PC IOL in good position with open PC   Anterior Vitreous Vitreous syneresis, Posterior vitreous detachment, vitreous condensations post vitrectomy         Fundus Exam       Right Left   Disc Pink and Sharp trace pallor, sharp rim, thin superior rim, mild PPA superiorly   C/D Ratio 0.6 0.65   Macula Flat, good foveal reflex, RPE mottling and clumping, prominent focal clump superior to fovea - stable, No heme or edema flat; mac hole closed, blunted foveal reflex, trace cystic changes centrally, trace Retinal pigment epithelial mottling, No heme   Vessels attenuated, mild tortuosity, mild AV crossing changes, mild copper wiring attenuated, mild tortuosity, mild AV crossing changes, mild copper wiring   Periphery Attached, mild reticular degeneration, no RT/RD, No heme Attached, mild reticular degeneration, good laser 360, No RT/RD           Refraction     Wearing Rx       Sphere Cylinder Axis Add   Right +0.75 +1.25 028 +2.25   Left -0.25 +1.00 028 +2.25           IMAGING AND PROCEDURES  Imaging and Procedures for @TODAY @  OCT, Retina -  OU - Both Eyes       Right Eye Quality was good. Central Foveal Thickness: 252. Progression has been stable. Findings include normal foveal contour, no IRF, no SRF, outer retinal atrophy (Focal druse/ellipsoid disruption superior to fovea -- stable; partial PVD).   Left Eye Quality was good. Central Foveal Thickness: 351. Progression has been stable. Findings include abnormal foveal contour, intraretinal fluid, subretinal fluid (Mac hole closed, persistent cystic changes / IRF, focal pocket of SRF).   Notes *Images  captured and stored on drive  Diagnosis / Impression:  OD: NFP, No IRF/SRF; Focal ellipsoid disruption superior to fovea --persistent; partial PVD  OS: Mac hole closed, persistent cystic changes / IRF, focal pocket of SRF  Clinical management:  See below  Abbreviations: NFP - Normal foveal profile. CME - cystoid macular edema. PED - pigment epithelial detachment. IRF - intraretinal fluid. SRF - subretinal fluid. EZ - ellipsoid zone. ERM - epiretinal membrane. ORA - outer retinal atrophy. ORT - outer retinal tubulation. SRHM - subretinal hyper-reflective material            ASSESSMENT/PLAN:    ICD-10-CM   1. Macular hole of left eye  H35.342 OCT, Retina - OU - Both Eyes    2. Cystoid macular edema of left eye  H35.352     3. Essential hypertension  I10     4. Hypertensive retinopathy of both eyes  H35.033     5. Combined forms of age-related cataract of right eye  H25.811     6. Pseudophakia  Z96.1     7. Dry eyes  H04.123       1,2. Full Thickness Macular Hole OS  - preop BCVA 20/100 w/ central scotoma OS  - s/p PPV/ICG/Membrane peel/C3F8, 01.02.20  - doing well with macular hole closed and ellipsoid signal improved back to baseline essentially  - BCVA: 20/40 --stable  - tr IRF/cystic changes on OCT, post PF/prolensa taper -- stopped in October 2021  - OCT 4.26.23 shows interval development of small vitelliform like lesion / focal SRF OS  - today OCT shows Mac hole closed, persistent cystic changes / IRF, focal pocket of SRF  - IOP okay at 12  - no intervention recommended at this time -- monitor  - F/U 6 months -- DFE/OCT  3,4. Hypertensive retinopathy OU  - discussed importance of tight BP control  - monitor   5. 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  - discussed diagnosis and progression  - under the expert management of Dr. Zetta Bills  6. Pseudophakia OS  -  s/p CE/IOL OS (June 2020, C. Groat)  - beautiful surgery with IOL in excellent position  - tr cystic changes as discussed above  - monitor   7. Dry eyes OU  - recommend artificial tears and lubricating ointment as needed (every 1-2 hours is fine)  - using Refresh PF and Tyrvana  Ophthalmic Meds Ordered this visit:  No orders of the defined types were placed in this encounter.    Return in about 6 months (around 12/06/2023) for f/u FTMH OS, DFE, OCT.  There are no Patient Instructions on file for this visit.  This document serves as a record of services personally performed by Karie Chimera, MD, PhD. It was created on their behalf by De Blanch, an ophthalmic technician. The creation of this record is the provider's dictation and/or activities during the  visit.    Electronically signed by: De Blanch, OA, 06/09/23  12:14 AM  This document serves as a record of services personally performed by Karie Chimera, MD, PhD. It was created on their behalf by Glee Arvin. Manson Passey, OA an ophthalmic technician. The creation of this record is the provider's dictation and/or activities during the visit.    Electronically signed by: Glee Arvin. Manson Passey, OA 06/09/23 12:14 AM  Karie Chimera, M.D., Ph.D. Diseases & Surgery of the Retina and Vitreous [facility]  I have reviewed the above documentation for accuracy and completeness, and I agree with the above. Karie Chimera, M.D., Ph.D. 06/09/23 12:16 AM   Abbreviations: M myopia (nearsighted); A astigmatism; H hyperopia (farsighted); P presbyopia; Mrx spectacle prescription;  CTL contact lenses; OD right eye; OS left eye; OU both eyes  XT exotropia; ET esotropia; PEK punctate epithelial keratitis; PEE punctate epithelial erosions; DES dry eye syndrome; MGD meibomian gland dysfunction; ATs artificial tears; PFAT's preservative free artificial tears; NSC nuclear sclerotic cataract; PSC posterior subcapsular cataract; ERM  epi-retinal membrane; PVD posterior vitreous detachment; RD retinal detachment; DM diabetes mellitus; DR diabetic retinopathy; NPDR non-proliferative diabetic retinopathy; PDR proliferative diabetic retinopathy; CSME clinically significant macular edema; DME diabetic macular edema; dbh dot blot hemorrhages; CWS cotton wool spot; POAG primary open angle glaucoma; C/D cup-to-disc ratio; HVF humphrey visual field; GVF goldmann visual field; OCT optical coherence tomography; IOP intraocular pressure; BRVO Branch retinal vein occlusion; CRVO central retinal vein occlusion; CRAO central retinal artery occlusion; BRAO branch retinal artery occlusion; RT retinal tear; SB scleral buckle; PPV pars plana vitrectomy; VH Vitreous hemorrhage; PRP panretinal laser photocoagulation; IVK intravitreal kenalog; VMT vitreomacular traction; MH Macular hole;  NVD neovascularization of the disc; NVE neovascularization elsewhere; AREDS age related eye disease study; ARMD age related macular degeneration; POAG primary open angle glaucoma; EBMD epithelial/anterior basement membrane dystrophy; ACIOL anterior chamber intraocular lens; IOL intraocular lens; PCIOL posterior chamber intraocular lens; Phaco/IOL phacoemulsification with intraocular lens placement; PRK photorefractive keratectomy; LASIK laser assisted in situ keratomileusis; HTN hypertension; DM diabetes mellitus; COPD chronic obstructive pulmonary disease

## 2023-06-07 ENCOUNTER — Encounter (INDEPENDENT_AMBULATORY_CARE_PROVIDER_SITE_OTHER): Payer: Self-pay | Admitting: Ophthalmology

## 2023-06-07 ENCOUNTER — Ambulatory Visit (INDEPENDENT_AMBULATORY_CARE_PROVIDER_SITE_OTHER): Payer: Medicare PPO | Admitting: Ophthalmology

## 2023-06-07 DIAGNOSIS — H35342 Macular cyst, hole, or pseudohole, left eye: Secondary | ICD-10-CM

## 2023-06-07 DIAGNOSIS — Z961 Presence of intraocular lens: Secondary | ICD-10-CM

## 2023-06-07 DIAGNOSIS — I10 Essential (primary) hypertension: Secondary | ICD-10-CM | POA: Diagnosis not present

## 2023-06-07 DIAGNOSIS — H35033 Hypertensive retinopathy, bilateral: Secondary | ICD-10-CM | POA: Diagnosis not present

## 2023-06-07 DIAGNOSIS — H25811 Combined forms of age-related cataract, right eye: Secondary | ICD-10-CM

## 2023-06-07 DIAGNOSIS — H35352 Cystoid macular degeneration, left eye: Secondary | ICD-10-CM

## 2023-06-07 DIAGNOSIS — H04123 Dry eye syndrome of bilateral lacrimal glands: Secondary | ICD-10-CM

## 2023-06-09 ENCOUNTER — Encounter (INDEPENDENT_AMBULATORY_CARE_PROVIDER_SITE_OTHER): Payer: Self-pay | Admitting: Ophthalmology

## 2023-11-27 NOTE — Progress Notes (Signed)
 [facility] - Clinic Note  12/06/2023     CHIEF COMPLAINT Patient presents for Retina Follow Up  HISTORY OF PRESENT ILLNESS: Dominique Lee is a 73 y.o. female who presents to the clinic today for:   HPI     Retina Follow Up   Patient presents with  Other.  In left eye.  Severity is moderate.  Duration of 6 months.  Since onset it is stable.  I, the attending physician,  performed the HPI with the patient and updated documentation appropriately.        Comments   6 month Retina eval. Patient states she had CE os on 09/12/23 with Dr. JAYSON Gaudy. Vision seems the same      Last edited by Valdemar Rogue, MD on 12/10/2023  2:14 PM.    Pt states she had cataract sx OD with Dr. Manley Gaudy on March 25th, she states a couple months ago, she had an episode where she couldn't see out of the lower temporal corner of her left eye and then saw flickering lights that looked like a ceiling fan, it lasted for about 10-15 minutes and hasn't happened again  Referring physician: No referring provider defined for this encounter.  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   bacitracin -polymyxin b  (POLYSPORIN ) ophthalmic ointment Place into the left eye 4 (four) times daily. Place a 1/2 inch ribbon of ointment into the lower eyelid.   bromfenac  (XIBROM ) 0.09 % ophthalmic solution INSTILL 1 DROP OF SOLUTION INTO LEFT EYE TWICE DAILY (Patient not taking: No sig reported)   Bromfenac  Sodium (PROLENSA ) 0.07 % SOLN Place 1 drop into the left eye 2 (two) times daily.   prednisoLONE  acetate (PRED FORTE ) 1 % ophthalmic suspension Place 1 drop into the left eye 2 (two) times daily.    RESTASIS 0.05 % ophthalmic emulsion Place 1 drop into both eyes in the morning and at bedtime.   XIIDRA 5 % SOLN Apply 1 drop to eye 2 (two) times daily.   No current facility-administered medications for this visit. (Ophthalmic Drugs)   Current Outpatient Medications (Other)  Medication Sig   acetaminophen  (TYLENOL ) 500 MG tablet Take 1,000 mg by mouth at bedtime.   Cholecalciferol 25 MCG (1000 UT) tablet Take 1,000 Units by mouth daily with lunch.   fluticasone  (FLONASE ) 50 MCG/ACT nasal spray Place 1 spray into both nostrils daily as needed for allergies or rhinitis.   folic acid  (FOLVITE ) 1 MG tablet Take 1 mg by mouth daily with lunch.    ibuprofen  (ADVIL ,MOTRIN ) 200 MG tablet Take 1 tablet (200 mg total) by mouth 3 (three) times daily.   leflunomide  (ARAVA ) 10 MG tablet Take 10 mg by mouth daily with lunch.    levothyroxine  (SYNTHROID , LEVOTHROID) 75 MCG tablet Take 75 mcg by mouth daily before breakfast.    methotrexate  2.5 MG tablet Take 15 mg by mouth every Thursday. Thursdays with evening meal   metoprolol  succinate (TOPROL -XL) 50 MG 24 hr tablet Take 1-1.5 tablets (50-75 mg total) by mouth daily.   cyclobenzaprine  (FEXMID ) 7.5 MG tablet Take 1 tablet (7.5 mg total) by mouth 3 (three) times daily as needed for muscle spasms.   denosumab (PROLIA) 60  MG/ML SOSY injection Inject 60 mg into the skin every 6 (six) months.   enoxaparin  (LOVENOX ) 40 MG/0.4ML injection Inject 0.4 mLs (40 mg total) into the skin daily.   omeprazole  (PRILOSEC) 20 MG capsule Take 1 capsule (20 mg total) by mouth daily for 14 days. (Patient not taking: Reported on 06/08/2018)   ondansetron  (ZOFRAN ) 4 MG tablet  (Patient not taking: Reported on 12/15/2020)   polyethylene glycol (MIRALAX  / GLYCOLAX ) packet Take 17 g by mouth daily as needed for mild constipation. (Patient not taking: Reported on 12/15/2020)   No current facility-administered medications for this visit. (Other)   REVIEW OF SYSTEMS: ROS   Positive  for: Musculoskeletal, Endocrine, Cardiovascular, Eyes, Respiratory Negative for: Constitutional, Gastrointestinal, Neurological, Skin, Genitourinary, HENT, Psychiatric, Allergic/Imm, Heme/Lymph Last edited by German Olam BRAVO, COT on 12/06/2023  8:41 AM.       ALLERGIES Allergies  Allergen Reactions   Codeine Other (See Comments)    Agitation    Morphine And Codeine Other (See Comments)    agitation    Statins Other (See Comments)    Joint  pain   PAST MEDICAL HISTORY Past Medical History:  Diagnosis Date   Cancer (HCC)    Basal cell- skin   Cataract    OD   Complication of anesthesia    COPD (chronic obstructive pulmonary disease) (HCC)    Dysrhythmia    [name]   Family history of adverse reaction to anesthesia    Mother N/V   Fibromyalgia    High cholesterol    Hypertensive retinopathy    OU   Hypothyroidism    Irregular heart beat    Osteoarthritis    Pneumothorax after biopsy    PONV (postoperative nausea and vomiting)    [name] (premature ventricular contraction)    RA (rheumatoid arthritis) (HCC)    Thyroid disease    Hypothyroid   Past Surgical History:  Procedure Laterality Date   25 GAUGE PARS PLANA VITRECTOMY WITH 20 GAUGE MVR PORT FOR MACULAR HOLE Left 06/21/2018   Procedure: 25 GAUGE PARS PLANA VITRECTOMY WITH 20 GAUGE MVR PORT FOR MACULAR HOLE;  Surgeon: Valdemar Rogue, MD;  Location: [facility] OR;  Service: Ophthalmology;  Laterality: Left;   ABDOMINAL HYSTERECTOMY  2000   BREAST SURGERY     saline implants   CARDIOVASCULAR STRESS TEST  09/02/2005   Cardiolite Myocardial perfusion study; NegativeBruce protocol exercise stress test   CATARACT EXTRACTION Left 11/2018   Dr. Medford Gaudy   EYE SURGERY Left    macular hole repair   GAS/FLUID EXCHANGE Left 06/21/2018   Procedure: GAS/FLUID EXCHANGE;  Surgeon: Valdemar Rogue, MD;  Location: [facility];  Service: Ophthalmology;  Laterality: Left;  C3F8   HIP PINNING,CANNULATED Right 12/18/2017   Procedure: CANNULATED HIP PINNING;   Surgeon: Cristy Bonner DASEN, MD;  Location: [facility] OR;  Service: Orthopedics;  Laterality: Right;   LAPAROSCOPIC PARTIAL COLECTOMY Left 05/18/2017   Lung Biospy     MEMBRANE PEEL Left 06/21/2018   Procedure: MEMBRANE PEEL;  Surgeon: Valdemar Rogue, MD;  Location: [facility] OR;  Service: Ophthalmology;  Laterality: Left;   NASAL SINUS SURGERY     PHOTOCOAGULATION WITH LASER Left 06/21/2018   Procedure: PHOTOCOAGULATION WITH LASER;  Surgeon: Valdemar Rogue, MD;  Location: [facility] OR;  Service: Ophthalmology;  Laterality: Left;   TUBAL LIGATION     US  ECHOCARDIOGRAPHY  12/26/2011   mild abnormalities   FAMILY HISTORY Family History  Problem Relation Age of Onset   Hypertension Mother  Cancer - Lung Father 55       deceased   Heart attack Maternal Grandmother    Cancer Maternal Grandfather    Diabetes Paternal Grandfather    Diabetes Brother    Cancer - Colon Sister    SOCIAL HISTORY Social History   Tobacco Use   Smoking status: Former    Current packs/day: 0.00    Types: Cigarettes    Quit date: 11/04/2002    Years since quitting: 21.1   Smokeless tobacco: Never  Vaping Use   Vaping status: Never Used  Substance Use Topics   Alcohol use: No   Drug use: No       OPHTHALMIC EXAM:  Base Eye Exam     Visual Acuity (Snellen - Linear)       Right Left   Dist cc 20/20 -1 20/50 -2   Dist ph cc  20/NI    Correction: Glasses         Tonometry (Tonopen, 8:57 AM)       Right Left   Pressure 10 9         Pupils       Dark Light Shape React APD   Right 3 2 Round Brisk None   Left 3 2 Round Brisk None         Visual Fields (Counting fingers)       Left Right    Full Full         Extraocular Movement       Right Left    Full, Ortho Full, Ortho         Neuro/Psych     Oriented x3: Yes   Mood/Affect: Normal         Dilation     Both eyes: 1.0% Mydriacyl , 2.5% Phenylephrine  @ 8:57 AM           Slit Lamp and Fundus Exam     Slit Lamp Exam       Right Left    Lids/Lashes Dermatochalasis - upper lid, lower punctal plug in place Dermatochalasis - upper lid, lower punctal plug in place, mild MGD   Conjunctiva/Sclera White and quiet nasal and temporla Pinguecula   Cornea arcus, trace tear film debris, well healed cataract wound arcus, 2-3+Punctate epithelial erosions, Well healed cataract wounds   Anterior Chamber deep and clear deep and clear   Iris Round and dilated Round and well dilated   Lens PC IOL in good position PC IOL in good position with open PC   Anterior Vitreous Vitreous syneresis, Posterior vitreous detachment, vitreous condensations post vitrectomy         Fundus Exam       Right Left   Disc Pink and Sharp trace pallor, sharp rim, thin superior rim, mild PPA superiorly   C/D Ratio 0.6 0.65   Macula Flat, good foveal reflex, RPE mottling and clumping, prominent focal clump superior to fovea - stable, No heme or edema flat; mac hole closed, blunted foveal reflex, trace cystic changes centrally, trace Retinal pigment epithelial mottling, No heme   Vessels attenuated, mild tortuosity attenuated, mild tortuosity   Periphery Attached, mild reticular degeneration, no RT/RD, No heme Attached, mild reticular degeneration, good laser 360, No RT/RD           Refraction     Wearing Rx       Sphere Cylinder Axis Add   Right +0.25   +2.50   Left +0.00 +1.00 169 +2.50  Manifest Refraction (Auto)       Sphere Cylinder Axis Dist VA   Right       Left -0.75 +1.00 174 20/40-1           IMAGING AND PROCEDURES  Imaging and Procedures for @TODAY @  OCT, Retina - OU - Both Eyes       Right Eye Quality was good. Central Foveal Thickness: 252. Progression has been stable. Findings include normal foveal contour, no IRF, no SRF, outer retinal atrophy (Focal druse/ellipsoid disruption superior to fovea -- stable; partial PVD).   Left Eye Quality was good. Central Foveal Thickness: 357. Progression has been stable.  Findings include abnormal foveal contour, intraretinal fluid, subretinal fluid (Mac hole closed, persistent cystic changes / IRF -- slightly worse, focal pocket of SRF).   Notes *Images captured and stored on drive  Diagnosis / Impression:  OD: NFP, No IRF/SRF; Focal ellipsoid disruption superior to fovea --persistent; partial PVD  OS: Mac hole closed, persistent cystic changes / IRF -- slightly worse, focal pocket of SRF  Clinical management:  See below  Abbreviations: NFP - Normal foveal profile. CME - cystoid macular edema. PED - pigment epithelial detachment. IRF - intraretinal fluid. SRF - subretinal fluid. EZ - ellipsoid zone. ERM - epiretinal membrane. ORA - outer retinal atrophy. ORT - outer retinal tubulation. SRHM - subretinal hyper-reflective material             ASSESSMENT/PLAN:    ICD-10-CM   1. Macular hole of left eye  H35.342 OCT, Retina - OU - Both Eyes    2. Cystoid macular edema of left eye  H35.352     3. Essential hypertension  I10     4. Hypertensive retinopathy of both eyes  H35.033     5. Pseudophakia, both eyes  Z96.1     6. Dry eyes  H04.123      1,2. Full Thickness Macular Hole OS  - preop BCVA 20/100 w/ central scotoma OS  - s/p PPV/ICG/Membrane peel/C3F8, 01.02.20  - doing well with macular hole closed and ellipsoid signal improved back to baseline essentially  - BCVA: 20/50 -- decreased from 20/40  - tr IRF/cystic changes on OCT, post PF/prolensa  taper -- stopped in October 2021  - OCT 4.26.23 shows interval development of small vitelliform like lesion / focal SRF OS  - today OCT shows Mac hole closed, persistent cystic changes / IRF -- slightly increased, focal pocket of SRF  - IOP okay at 09  - no intervention recommended at this time -- monitor  - F/U 6 months -- DFE/OCT  3,4. Hypertensive retinopathy OU  - discussed importance of tight BP control  - monitor   5. Pseudophakia OS  - s/p CE/IOL OS MICHAELL Gaudy, OS: June 2020, OD: September 12, 2023)  - beautiful surgery with IOL in excellent position  - tr cystic changes as discussed above  - monitor   6. Dry eyes OU  - recommend artificial tears and lubricating ointment as needed (every 1-2 hours is fine)  - using Refresh PF and Tyrvana  Ophthalmic Meds Ordered this visit:  No orders of the defined types were placed in this encounter.    Return in about 6 months (around 06/06/2024) for f/u FTMH OS, DFE, OCT.  There are no Patient Instructions on file for this visit.  This document serves as a record of services personally performed by Redell JUDITHANN Hans, MD, PhD. It was created on their behalf by Almetta Pesa, an  ophthalmic technician. The creation of this record is the provider's dictation and/or activities during the visit.    Electronically signed by: Almetta Pesa, OA, 12/10/23  2:15 PM  This document serves as a record of services personally performed by Redell JUDITHANN Hans, MD, PhD. It was created on their behalf by Alan PARAS. Delores, OA an ophthalmic technician. The creation of this record is the provider's dictation and/or activities during the visit.    Electronically signed by: Alan PARAS. Delores, OA 12/10/23 2:15 PM  Redell JUDITHANN Hans, M.D., Ph.D. Diseases & Surgery of the Retina and Vitreous [facility]  I have reviewed the above documentation for accuracy and completeness, and I agree with the above. Redell JUDITHANN Hans, M.D., Ph.D. 12/10/23 2:19 PM   Abbreviations: M myopia (nearsighted); A astigmatism; H hyperopia (farsighted); P presbyopia; Mrx spectacle prescription;  CTL contact lenses; OD right eye; OS left eye; OU both eyes  XT exotropia; ET esotropia; PEK punctate epithelial keratitis; PEE punctate epithelial erosions; DES dry eye syndrome; MGD meibomian gland dysfunction; ATs artificial tears; PFAT's preservative free artificial tears; NSC nuclear sclerotic cataract; PSC posterior subcapsular cataract; ERM epi-retinal membrane; PVD  posterior vitreous detachment; RD retinal detachment; DM diabetes mellitus; DR diabetic retinopathy; NPDR non-proliferative diabetic retinopathy; PDR proliferative diabetic retinopathy; CSME clinically significant macular edema; DME diabetic macular edema; dbh dot blot hemorrhages; CWS cotton wool spot; POAG primary open angle glaucoma; C/D cup-to-disc ratio; HVF humphrey visual field; GVF goldmann visual field; OCT optical coherence tomography; IOP intraocular pressure; BRVO Branch retinal vein occlusion; CRVO central retinal vein occlusion; CRAO central retinal artery occlusion; BRAO branch retinal artery occlusion; RT retinal tear; SB scleral buckle; PPV pars plana vitrectomy; VH Vitreous hemorrhage; PRP panretinal laser photocoagulation; IVK intravitreal kenalog ; VMT vitreomacular traction; MH Macular hole;  NVD neovascularization of the disc; NVE neovascularization elsewhere; AREDS age related eye disease study; ARMD age related macular degeneration; POAG primary open angle glaucoma; EBMD epithelial/anterior basement membrane dystrophy; ACIOL anterior chamber intraocular lens; IOL intraocular lens; PCIOL posterior chamber intraocular lens; Phaco/IOL phacoemulsification with intraocular lens placement; PRK photorefractive keratectomy; LASIK laser assisted in situ keratomileusis; HTN hypertension; DM diabetes mellitus; COPD chronic obstructive pulmonary disease

## 2023-12-06 ENCOUNTER — Encounter (INDEPENDENT_AMBULATORY_CARE_PROVIDER_SITE_OTHER): Payer: Self-pay | Admitting: Ophthalmology

## 2023-12-06 ENCOUNTER — Ambulatory Visit (INDEPENDENT_AMBULATORY_CARE_PROVIDER_SITE_OTHER): Payer: Medicare PPO | Admitting: Ophthalmology

## 2023-12-06 DIAGNOSIS — I10 Essential (primary) hypertension: Secondary | ICD-10-CM | POA: Diagnosis not present

## 2023-12-06 DIAGNOSIS — Z961 Presence of intraocular lens: Secondary | ICD-10-CM

## 2023-12-06 DIAGNOSIS — H35342 Macular cyst, hole, or pseudohole, left eye: Secondary | ICD-10-CM | POA: Diagnosis not present

## 2023-12-06 DIAGNOSIS — H35033 Hypertensive retinopathy, bilateral: Secondary | ICD-10-CM | POA: Diagnosis not present

## 2023-12-06 DIAGNOSIS — H04123 Dry eye syndrome of bilateral lacrimal glands: Secondary | ICD-10-CM

## 2023-12-06 DIAGNOSIS — H35352 Cystoid macular degeneration, left eye: Secondary | ICD-10-CM | POA: Diagnosis not present

## 2023-12-06 DIAGNOSIS — H25811 Combined forms of age-related cataract, right eye: Secondary | ICD-10-CM

## 2023-12-10 ENCOUNTER — Encounter (INDEPENDENT_AMBULATORY_CARE_PROVIDER_SITE_OTHER): Payer: Self-pay | Admitting: Ophthalmology

## 2024-06-05 ENCOUNTER — Encounter (INDEPENDENT_AMBULATORY_CARE_PROVIDER_SITE_OTHER): Admitting: Ophthalmology

## 2024-07-17 NOTE — Progress Notes (Signed)
 " [facility] - Clinic Note  07/19/2024     CHIEF COMPLAINT Patient presents for Retina Follow Up  HISTORY OF PRESENT ILLNESS: Dominique Lee is a 74 y.o. female who presents to the clinic today for:   HPI     Retina Follow Up   Patient presents with  Other.  In left eye.  Severity is moderate.  Duration of 6 months.  Since onset it is stable.  I, the attending physician,  performed the HPI with the patient and updated documentation appropriately.        Comments   6 month Retina eval. Patient states VA is the same. Pt c/o floaters Ou that come and go. Pt denies FOL. Pt uses gentle gel drops at bedtime OU and Sysytane PF.      Last edited by Valdemar Rogue, MD on 07/19/2024  3:53 PM.    Pt states the vision is doing well and no issues with the vision.   Referring physician: No referring provider defined for this encounter.  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 Place a 1/2 inch ribbon of ointment into the lower eyelid.   bromfenac  (XIBROM ) 0.09 % ophthalmic solution INSTILL 1 DROP OF SOLUTION INTO LEFT EYE TWICE DAILY (Patient not taking: No sig reported)   Bromfenac  Sodium (PROLENSA ) 0.07 % SOLN Place 1 drop into the left eye 2 (two) times daily.   prednisoLONE  acetate (PRED FORTE ) 1 % ophthalmic suspension Place 1 drop into the left eye 2 (two) times daily.   Propylene Glycol 0.6 % SOLN Place 1 drop into both eyes 3 (three) times daily as needed (dry/irritated eyes.).   RESTASIS 0.05 % ophthalmic emulsion Place 1 drop into both eyes in the morning and at bedtime.   XIIDRA 5 % SOLN Apply 1 drop to eye 2 (two) times daily.   No current  facility-administered medications for this visit. (Ophthalmic Drugs)   Current Outpatient Medications (Other)  Medication Sig   ezetimibe (ZETIA) 10 MG tablet Take 10 mg by mouth daily.   acetaminophen  (TYLENOL ) 500 MG tablet Take 1,000 mg by mouth at bedtime.   Cholecalciferol 25 MCG (1000 UT) tablet Take 1,000 Units by mouth daily with lunch.   cyclobenzaprine  (FEXMID ) 7.5 MG tablet Take 1 tablet (7.5 mg total) by mouth 3 (three) times daily as needed for muscle spasms.   denosumab (PROLIA) 60 MG/ML SOSY injection Inject 60 mg into the skin every 6 (six) months.   enoxaparin  (LOVENOX ) 40 MG/0.4ML injection Inject 0.4 mLs (40 mg total) into the skin daily.   fluticasone  (FLONASE ) 50 MCG/ACT nasal spray Place 1 spray into both nostrils daily as needed for allergies or rhinitis.   folic acid  (FOLVITE ) 1 MG tablet Take 1 mg by mouth daily with lunch.    ibuprofen  (ADVIL ,MOTRIN ) 200 MG tablet Take 1 tablet (200 mg total) by mouth 3 (three) times daily.   leflunomide  (ARAVA ) 10 MG tablet Take 10 mg by mouth daily with lunch.    levothyroxine  (SYNTHROID , LEVOTHROID) 75 MCG tablet Take 75 mcg by mouth daily before breakfast.    methotrexate  2.5 MG tablet Take 15 mg by mouth every Thursday. Thursdays with evening meal   metoprolol  succinate (TOPROL -XL) 50 MG 24  hr tablet Take 1-1.5 tablets (50-75 mg total) by mouth daily.   omeprazole  (PRILOSEC) 20 MG capsule Take 1 capsule (20 mg total) by mouth daily for 14 days. (Patient not taking: Reported on 06/08/2018)   ondansetron  (ZOFRAN ) 4 MG tablet  (Patient not taking: Reported on 12/15/2020)   polyethylene glycol (MIRALAX  / GLYCOLAX ) packet Take 17 g by mouth daily as needed for mild constipation. (Patient not taking: Reported on 12/15/2020)   No current facility-administered medications for this visit. (Other)   REVIEW OF SYSTEMS:  ALLERGIES Allergies  Allergen Reactions   Codeine Other (See Comments)    Agitation    Morphine And Codeine Other  (See Comments)    agitation    Statins Other (See Comments)    Joint  pain   PAST MEDICAL HISTORY Past Medical History:  Diagnosis Date   Cancer (HCC)    Basal cell- skin   Cataract    OD   Complication of anesthesia    COPD (chronic obstructive pulmonary disease) (HCC)    Dysrhythmia    [name]   Family history of adverse reaction to anesthesia    Mother N/V   Fibromyalgia    High cholesterol    Hypertensive retinopathy    OU   Hypothyroidism    Irregular heart beat    Osteoarthritis    Pneumothorax after biopsy    PONV (postoperative nausea and vomiting)    [name] (premature ventricular contraction)    RA (rheumatoid arthritis) (HCC)    Thyroid disease    Hypothyroid   Past Surgical History:  Procedure Laterality Date   25 GAUGE PARS PLANA VITRECTOMY WITH 20 GAUGE MVR PORT FOR MACULAR HOLE Left 06/21/2018   Procedure: 25 GAUGE PARS PLANA VITRECTOMY WITH 20 GAUGE MVR PORT FOR MACULAR HOLE;  Surgeon: Valdemar Rogue, MD;  Location: [facility] OR;  Service: Ophthalmology;  Laterality: Left;   ABDOMINAL HYSTERECTOMY  2000   BREAST SURGERY     saline implants   CARDIOVASCULAR STRESS TEST  09/02/2005   Cardiolite Myocardial perfusion study; NegativeBruce protocol exercise stress test   CATARACT EXTRACTION Left 11/2018   Dr. Medford Gaudy   EYE SURGERY Left    macular hole repair   GAS/FLUID EXCHANGE Left 06/21/2018   Procedure: GAS/FLUID EXCHANGE;  Surgeon: Valdemar Rogue, MD;  Location: [facility] OR;  Service: Ophthalmology;  Laterality: Left;  C3F8   HIP PINNING,CANNULATED Right 12/18/2017   Procedure: CANNULATED HIP PINNING;  Surgeon: Cristy Bonner DASEN, MD;  Location: [facility] OR;  Service: Orthopedics;  Laterality: Right;   LAPAROSCOPIC PARTIAL COLECTOMY Left 05/18/2017   Lung Biospy     MEMBRANE PEEL Left 06/21/2018   Procedure: MEMBRANE PEEL;  Surgeon: Valdemar Rogue, MD;  Location: [facility] OR;  Service: Ophthalmology;  Laterality: Left;   NASAL SINUS SURGERY     PHOTOCOAGULATION WITH LASER Left 06/21/2018    Procedure: PHOTOCOAGULATION WITH LASER;  Surgeon: Valdemar Rogue, MD;  Location: [facility] OR;  Service: Ophthalmology;  Laterality: Left;   TUBAL LIGATION     US  ECHOCARDIOGRAPHY  12/26/2011   mild abnormalities   FAMILY HISTORY Family History  Problem Relation Age of Onset   Hypertension Mother    Cancer - Lung Father 80       deceased   Heart attack Maternal Grandmother    Cancer Maternal Grandfather    Diabetes Paternal Grandfather    Diabetes Brother    Cancer - Colon Sister    SOCIAL HISTORY Social History   Tobacco Use   Smoking status:  Former    Current packs/day: 0.00    Types: Cigarettes    Quit date: 11/04/2002    Years since quitting: 21.7   Smokeless tobacco: Never  Vaping Use   Vaping status: Never Used  Substance Use Topics   Alcohol use: No   Drug use: No       OPHTHALMIC EXAM:  Base Eye Exam     Visual Acuity (Snellen - Linear)       Right Left   Dist cc 20/20 -2 20/50 -2   Dist ph cc  NI    Correction: Glasses         Tonometry (Tonopen, 9:45 AM)       Right Left   Pressure 12 12         Pupils       Pupils Dark Light Shape React APD   Right PERRL 3 2 Round Brisk None   Left PERRL 3 2 Round Brisk None         Visual Fields       Left Right    Full Full         Extraocular Movement       Right Left    Full, Ortho Full, Ortho         Neuro/Psych     Oriented x3: Yes   Mood/Affect: Normal         Dilation     Both eyes: 1.0% Mydriacyl , 2.5% Phenylephrine  @ 9:46 AM           Slit Lamp and Fundus Exam     Slit Lamp Exam       Right Left   Lids/Lashes Dermatochalasis - upper lid, lower punctal plug in place Dermatochalasis - upper lid, lower punctal plug in place, mild MGD   Conjunctiva/Sclera White and quiet nasal and temporla Pinguecula   Cornea arcus, trace tear film debris, well healed cataract wound arcus, 2+Punctate epithelial erosions, Well healed cataract wounds, Debris in tear film   Anterior Chamber  deep and clear deep and clear   Iris Round and dilated Round and well dilated   Lens PC IOL in good position PC IOL in good position with open PC   Anterior Vitreous Vitreous syneresis, Posterior vitreous detachment, vitreous condensations post vitrectomy, clear         Fundus Exam       Right Left   Disc Pink and Sharp trace pallor, sharp rim, thin superior rim, mild PPA superiorly   C/D Ratio 0.6 0.65   Macula Flat, good foveal reflex, RPE mottling and clumping, prominent focal clump superior to fovea - stable, No heme or edema flat; mac hole closed, blunted foveal reflex, trace cystic changes centrally, Retinal pigment epithelial mottling, No heme   Vessels attenuated, mild tortuosity attenuated, mild tortuosity   Periphery Attached, mild reticular degeneration, no RT/RD, No heme Attached, mild reticular degeneration, good laser 360, No RT/RD           Refraction     Wearing Rx       Sphere Cylinder Axis Add   Right +0.25   +2.50   Left +0.00 +1.00 169 +2.50           IMAGING AND PROCEDURES  Imaging and Procedures for @TODAY @  OCT, Retina - OU - Both Eyes       Right Eye Quality was good. Central Foveal Thickness: 253. Progression has been stable. Findings include normal foveal contour, no IRF, no SRF, outer  retinal atrophy (Focal druse/ellipsoid disruption superior to fovea -- stable; partial PVD).   Left Eye Quality was good. Central Foveal Thickness: 368. Progression has been stable. Findings include abnormal foveal contour, intraretinal fluid, subretinal fluid (Mac hole closed, persistent cystic changes / IRF, focal ellipsoid disruption centrally).   Notes *Images captured and stored on drive  Diagnosis / Impression:  OD: NFP, No IRF/SRF; Focal ellipsoid disruption superior to fovea --persistent; partial PVD  OS: Mac hole closed, persistent cystic changes / IRF,  focal ellipsoid disruption centrally  Clinical management:  See below  Abbreviations: NFP -  Normal foveal profile. CME - cystoid macular edema. PED - pigment epithelial detachment. IRF - intraretinal fluid. SRF - subretinal fluid. EZ - ellipsoid zone. ERM - epiretinal membrane. ORA - outer retinal atrophy. ORT - outer retinal tubulation. SRHM - subretinal hyper-reflective material            ASSESSMENT/PLAN:    ICD-10-CM   1. Macular hole of left eye  H35.342 OCT, Retina - OU - Both Eyes    2. Cystoid macular edema of left eye  H35.352     3. Essential hypertension  I10     4. Hypertensive retinopathy of both eyes  H35.033     5. Pseudophakia, both eyes  Z96.1     6. Dry eyes  H04.123      1,2. Full Thickness Macular Hole OS  - preop BCVA 20/100 w/ central scotoma OS  - s/p PPV/ICG/Membrane peel/C3F8, 01.02.20 - doing well with macular hole closed and ellipsoid signal improved back to baseline essentially  - BCVA OS: 20/50 -- stable - tr IRF/cystic changes on OCT, post PF/prolensa  taper -- stopped in October 2021 - OCT 4.26.23 shows interval development of small vitelliform like lesion / focal SRF OS - today OCT shows Mac hole closed, persistent cystic changes / IRF,  focal ellipsoid disruption centrally  - IOP okay at 12  - no intervention recommended at this time -- monitor  - F/U 6 months -- DFE/OCT  3,4. Hypertensive retinopathy OU  - discussed importance of tight BP control  - monitor   5. Pseudophakia OS  - s/p CE/IOL OS MICHAELL Gaudy, OS: June 2020, OD: September 12, 2023)  - beautiful surgery with IOL in excellent position  - tr cystic changes as discussed above  - monitor   6. Dry eyes OU - recommend artificial tears and lubricating ointment as needed (every 1-2 hours is fine)  - using Refresh PF and Tyrvana  Ophthalmic Meds Ordered this visit:  No orders of the defined types were placed in this encounter.    Return in about 6 months (around 01/16/2025) for f/u Mac hole OS , DFE, OCT.  There are no Patient Instructions on file for this visit.  This  document serves as a record of services personally performed by Redell JUDITHANN Hans, MD, PhD. It was created on their behalf by Delon Newness COT, an ophthalmic technician. The creation of this record is the provider's dictation and/or activities during the visit.    Electronically signed by: Delon Newness COT 01.28.26 3:54 PM  This document serves as a record of services personally performed by Redell JUDITHANN Hans, MD, PhD. It was created on their behalf by Wanda GEANNIE Keens, COT an ophthalmic technician. The creation of this record is the provider's dictation and/or activities during the visit.    Electronically signed by:  Wanda GEANNIE Keens, COT  07/19/24 3:54 PM  Redell JUDITHANN Hans, M.D., Ph.D. Diseases &  Surgery of the Retina and Vitreous [facility] 07/19/2024   I have reviewed the above documentation for accuracy and completeness, and I agree with the above. Redell JUDITHANN Hans, M.D., Ph.D. 07/19/24 3:55 PM   Abbreviations: M myopia (nearsighted); A astigmatism; H hyperopia (farsighted); P presbyopia; Mrx spectacle prescription;  CTL contact lenses; OD right eye; OS left eye; OU both eyes  XT exotropia; ET esotropia; PEK punctate epithelial keratitis; PEE punctate epithelial erosions; DES dry eye syndrome; MGD meibomian gland dysfunction; ATs artificial tears; PFAT's preservative free artificial tears; NSC nuclear sclerotic cataract; PSC posterior subcapsular cataract; ERM epi-retinal membrane; PVD posterior vitreous detachment; RD retinal detachment; DM diabetes mellitus; DR diabetic retinopathy; NPDR non-proliferative diabetic retinopathy; PDR proliferative diabetic retinopathy; CSME clinically significant macular edema; DME diabetic macular edema; dbh dot blot hemorrhages; CWS cotton wool spot; POAG primary open angle glaucoma; C/D cup-to-disc ratio; HVF humphrey visual field; GVF goldmann visual field; OCT optical coherence tomography; IOP intraocular pressure; BRVO  Branch retinal vein occlusion; CRVO central retinal vein occlusion; CRAO central retinal artery occlusion; BRAO branch retinal artery occlusion; RT retinal tear; SB scleral buckle; PPV pars plana vitrectomy; VH Vitreous hemorrhage; PRP panretinal laser photocoagulation; IVK intravitreal kenalog ; VMT vitreomacular traction; MH Macular hole;  NVD neovascularization of the disc; NVE neovascularization elsewhere; AREDS age related eye disease study; ARMD age related macular degeneration; POAG primary open angle glaucoma; EBMD epithelial/anterior basement membrane dystrophy; ACIOL anterior chamber intraocular lens; IOL intraocular lens; PCIOL posterior chamber intraocular lens; Phaco/IOL phacoemulsification with intraocular lens placement; PRK photorefractive keratectomy; LASIK laser assisted in situ keratomileusis; HTN hypertension; DM diabetes mellitus; COPD chronic obstructive pulmonary disease "

## 2024-07-19 ENCOUNTER — Encounter (INDEPENDENT_AMBULATORY_CARE_PROVIDER_SITE_OTHER): Payer: Self-pay | Admitting: Ophthalmology

## 2024-07-19 ENCOUNTER — Ambulatory Visit (INDEPENDENT_AMBULATORY_CARE_PROVIDER_SITE_OTHER): Admitting: Ophthalmology

## 2024-07-19 DIAGNOSIS — H04123 Dry eye syndrome of bilateral lacrimal glands: Secondary | ICD-10-CM | POA: Diagnosis not present

## 2024-07-19 DIAGNOSIS — H35352 Cystoid macular degeneration, left eye: Secondary | ICD-10-CM | POA: Diagnosis not present

## 2024-07-19 DIAGNOSIS — Z961 Presence of intraocular lens: Secondary | ICD-10-CM

## 2024-07-19 DIAGNOSIS — H35033 Hypertensive retinopathy, bilateral: Secondary | ICD-10-CM | POA: Diagnosis not present

## 2024-07-19 DIAGNOSIS — H35342 Macular cyst, hole, or pseudohole, left eye: Secondary | ICD-10-CM

## 2024-07-19 DIAGNOSIS — I10 Essential (primary) hypertension: Secondary | ICD-10-CM | POA: Diagnosis not present

## 2025-01-17 ENCOUNTER — Encounter (INDEPENDENT_AMBULATORY_CARE_PROVIDER_SITE_OTHER): Admitting: Ophthalmology
